# Patient Record
Sex: Female | Born: 1965
Health system: Southern US, Community
[De-identification: ages and names within clinical notes are randomized; demographics above are authoritative.]

## PROBLEM LIST (undated history)

## (undated) ENCOUNTER — Emergency Department

## (undated) DIAGNOSIS — K219 Gastro-esophageal reflux disease without esophagitis: Secondary | ICD-10-CM

## (undated) DIAGNOSIS — F4310 Post-traumatic stress disorder, unspecified: Secondary | ICD-10-CM

## (undated) DIAGNOSIS — F32A Depression, unspecified: Secondary | ICD-10-CM

## (undated) DIAGNOSIS — E1142 Type 2 diabetes mellitus with diabetic polyneuropathy: Secondary | ICD-10-CM

## (undated) DIAGNOSIS — E119 Type 2 diabetes mellitus without complications: Secondary | ICD-10-CM

## (undated) DIAGNOSIS — F419 Anxiety disorder, unspecified: Secondary | ICD-10-CM

## (undated) DIAGNOSIS — Z8052 Family history of malignant neoplasm of bladder: Secondary | ICD-10-CM

## (undated) DIAGNOSIS — I493 Ventricular premature depolarization: Secondary | ICD-10-CM

## (undated) DIAGNOSIS — G8929 Other chronic pain: Secondary | ICD-10-CM

## (undated) DIAGNOSIS — Z801 Family history of malignant neoplasm of trachea, bronchus and lung: Secondary | ICD-10-CM

## (undated) DIAGNOSIS — K8020 Calculus of gallbladder without cholecystitis without obstruction: Secondary | ICD-10-CM

## (undated) DIAGNOSIS — G43909 Migraine, unspecified, not intractable, without status migrainosus: Secondary | ICD-10-CM

## (undated) DIAGNOSIS — I10 Essential (primary) hypertension: Secondary | ICD-10-CM

## (undated) DIAGNOSIS — M545 Low back pain, unspecified: Secondary | ICD-10-CM

## (undated) DIAGNOSIS — F410 Panic disorder [episodic paroxysmal anxiety] without agoraphobia: Secondary | ICD-10-CM

## (undated) DIAGNOSIS — J189 Pneumonia, unspecified organism: Secondary | ICD-10-CM

## (undated) DIAGNOSIS — C50911 Malignant neoplasm of unspecified site of right female breast: Secondary | ICD-10-CM

## (undated) DIAGNOSIS — Z803 Family history of malignant neoplasm of breast: Secondary | ICD-10-CM

## (undated) DIAGNOSIS — K760 Fatty (change of) liver, not elsewhere classified: Secondary | ICD-10-CM

## (undated) DIAGNOSIS — M5136 Other intervertebral disc degeneration, lumbar region: Secondary | ICD-10-CM

## (undated) DIAGNOSIS — Z8042 Family history of malignant neoplasm of prostate: Secondary | ICD-10-CM

## (undated) DIAGNOSIS — E7800 Pure hypercholesterolemia, unspecified: Secondary | ICD-10-CM

## (undated) DIAGNOSIS — G709 Myoneural disorder, unspecified: Secondary | ICD-10-CM

## (undated) DIAGNOSIS — J439 Emphysema, unspecified: Secondary | ICD-10-CM

## (undated) DIAGNOSIS — F329 Major depressive disorder, single episode, unspecified: Secondary | ICD-10-CM

## (undated) DIAGNOSIS — R011 Cardiac murmur, unspecified: Secondary | ICD-10-CM

## (undated) DIAGNOSIS — R0600 Dyspnea, unspecified: Secondary | ICD-10-CM

## (undated) DIAGNOSIS — Z8041 Family history of malignant neoplasm of ovary: Secondary | ICD-10-CM

## (undated) DIAGNOSIS — Z808 Family history of malignant neoplasm of other organs or systems: Secondary | ICD-10-CM

## (undated) HISTORY — DX: Family history of malignant neoplasm of other organs or systems: Z80.8

## (undated) HISTORY — DX: Family history of malignant neoplasm of trachea, bronchus and lung: Z80.1

## (undated) HISTORY — DX: Family history of malignant neoplasm of prostate: Z80.42

## (undated) HISTORY — DX: Family history of malignant neoplasm of ovary: Z80.41

## (undated) HISTORY — DX: Type 2 diabetes mellitus without complications: E11.9

## (undated) HISTORY — DX: Family history of malignant neoplasm of bladder: Z80.52

## (undated) HISTORY — DX: Family history of malignant neoplasm of breast: Z80.3

---

## 1987-06-15 HISTORY — PX: WISDOM TOOTH EXTRACTION: SHX21

## 1996-06-14 HISTORY — PX: TUBAL LIGATION: SHX77

## 2009-05-11 ENCOUNTER — Ambulatory Visit: Payer: Self-pay | Admitting: Diagnostic Radiology

## 2009-05-11 ENCOUNTER — Emergency Department (HOSPITAL_BASED_OUTPATIENT_CLINIC_OR_DEPARTMENT_OTHER): Admission: EM | Admit: 2009-05-11 | Discharge: 2009-05-11 | Payer: Self-pay | Admitting: Emergency Medicine

## 2012-02-26 ENCOUNTER — Encounter (HOSPITAL_BASED_OUTPATIENT_CLINIC_OR_DEPARTMENT_OTHER): Payer: Self-pay | Admitting: *Deleted

## 2012-02-26 ENCOUNTER — Emergency Department (HOSPITAL_BASED_OUTPATIENT_CLINIC_OR_DEPARTMENT_OTHER)
Admission: EM | Admit: 2012-02-26 | Discharge: 2012-02-26 | Disposition: A | Discharge status: Home / Self Care | Payer: Self-pay | Attending: Emergency Medicine | Admitting: Emergency Medicine

## 2012-02-26 DIAGNOSIS — F17200 Nicotine dependence, unspecified, uncomplicated: Secondary | ICD-10-CM | POA: Insufficient documentation

## 2012-02-26 DIAGNOSIS — H9209 Otalgia, unspecified ear: Secondary | ICD-10-CM | POA: Insufficient documentation

## 2012-02-26 DIAGNOSIS — H6690 Otitis media, unspecified, unspecified ear: Secondary | ICD-10-CM

## 2012-02-26 DIAGNOSIS — I10 Essential (primary) hypertension: Secondary | ICD-10-CM | POA: Insufficient documentation

## 2012-02-26 HISTORY — DX: Migraine, unspecified, not intractable, without status migrainosus: G43.909

## 2012-02-26 HISTORY — DX: Essential (primary) hypertension: I10

## 2012-02-26 MED ORDER — HYDROCODONE-ACETAMINOPHEN 5-325 MG PO TABS: 2.0000 | ORAL_TABLET | ORAL | Status: AC | PRN

## 2012-02-26 MED ORDER — AMOXICILLIN 500 MG PO CAPS: 500.0000 mg | ORAL_CAPSULE | Freq: Three times a day (TID) | ORAL | Status: AC

## 2012-02-26 NOTE — ED Notes (Signed)
Pt reports a 1 week history of an occipital headache with pain radiating to right ear.  She also reports a cold 1 month ago.

## 2012-02-26 NOTE — ED Notes (Signed)
Family at bedside. 

## 2012-02-26 NOTE — ED Provider Notes (Signed)
History     CSN: [id number]  Arrival date & time 02/26/12  1638   First MD Initiated Contact with Patient 02/26/12 1732      Chief Complaint  Patient presents with  . Otalgia    (Consider location/radiation/quality/duration/timing/severity/associated sxs/prior treatment) Patient is a 46 y.o. female presenting with ear pain. The history is provided by the patient. No language interpreter was used.  Otalgia This is a new problem. The current episode started more than 1 week ago. There is pain in the right ear. The problem occurs constantly. The problem has been gradually worsening. There has been no fever. The pain is at a severity of 6/10. The pain is moderate. Associated symptoms include headaches and cough. Her past medical history does not include hearing loss.  Pt complains of right ear pain.  Pt reports congestion.  Pt reports she has also had a headache  Past Medical History  Diagnosis Date  . Hypertension   . Migraines     Past Surgical History  Procedure Date  . Cesarean section   . Tubal ligation     History reviewed. No pertinent family history.  History  Substance Use Topics  . Smoking status: Current Every Day Smoker  . Smokeless tobacco: Not on file  . Alcohol Use: Yes    OB History    Grav Para Term Preterm Abortions TAB SAB Ect Mult Living                  Review of Systems  HENT: Positive for ear pain.   Respiratory: Positive for cough.   Neurological: Positive for headaches.    Allergies  Sulfa antibiotics  Home Medications   Current Outpatient Rx  Name Route Sig Dispense Refill  . FLUOXETINE HCL 40 MG PO CAPS Oral Take 40 mg by mouth daily.    Marland Kitchen HYDROCHLOROTHIAZIDE 25 MG PO TABS Oral Take 25 mg by mouth daily.    . IBUPROFEN 200 MG PO TABS Oral Take 800 mg by mouth every 6 (six) hours as needed. For pain.    Marland Kitchen LISINOPRIL 40 MG PO TABS Oral Take 40 mg by mouth daily.    Marland Kitchen OMEPRAZOLE 10 MG PO CPDR Oral Take 10 mg by mouth daily.       BP 159/100  Pulse 96  Temp 98.1 F (36.7 C) (Oral)  Resp 20  Ht 5\' 4"  (1.626 m)  Wt 210 lb (95.255 kg)  BMI 36.05 kg/m2  SpO2 96%  Physical Exam  Nursing note and vitals reviewed. Constitutional: She is oriented to person, place, and time. She appears well-developed and well-nourished.  HENT:  Head: Normocephalic.  Left Ear: External ear normal.       Dull right tm.    Eyes: Conjunctivae normal are normal. Pupils are equal, round, and reactive to light.  Neck: Normal range of motion.  Cardiovascular: Normal rate and normal heart sounds.   Pulmonary/Chest: Effort normal and breath sounds normal.  Abdominal: Soft.  Musculoskeletal: Normal range of motion.  Neurological: She is alert and oriented to person, place, and time. She has normal reflexes.  Skin: Skin is warm.  Psychiatric: She has a normal mood and affect.    ED Course  Procedures (including critical care time)  Labs Reviewed - No data to display No results found.   No diagnosis found.    MDM  amoxicillain 500 and hydrocodone         Lonia Skinner [place], [place] 02/26/12 1831

## 2012-02-26 NOTE — ED Notes (Signed)
Pt c/o bilat ear pain (right is worse). Also has hx of migraines and has had a "sickening headache" for about a week.

## 2012-02-27 NOTE — ED Provider Notes (Signed)
Medical screening examination/treatment/procedure(s) were performed by non-physician practitioner and as supervising physician I was immediately available for consultation/collaboration.  Gauge Winski, MD 02/27/12 0046 

## 2012-10-04 ENCOUNTER — Encounter (HOSPITAL_COMMUNITY): Payer: Self-pay

## 2012-10-04 ENCOUNTER — Emergency Department (INDEPENDENT_AMBULATORY_CARE_PROVIDER_SITE_OTHER)
Admission: EM | Admit: 2012-10-04 | Discharge: 2012-10-04 | Disposition: A | Discharge status: Home / Self Care | Payer: Self-pay | Source: Home / Self Care | Attending: Family Medicine | Admitting: Family Medicine

## 2012-10-04 DIAGNOSIS — I10 Essential (primary) hypertension: Secondary | ICD-10-CM

## 2012-10-04 MED ORDER — HYDROCHLOROTHIAZIDE 25 MG PO TABS: 25.0000 mg | ORAL_TABLET | Freq: Every day | ORAL | Status: DC

## 2012-10-04 MED ORDER — LISINOPRIL 40 MG PO TABS: 40.0000 mg | ORAL_TABLET | Freq: Every day | ORAL | Status: DC

## 2012-10-04 NOTE — ED Provider Notes (Addendum)
History     CSN: [id number]  Arrival date & time 10/04/12  1153   First MD Initiated Contact with Patient 10/04/12 1326      Chief Complaint  Patient presents with  . Medication Refill    (Consider location/radiation/quality/duration/timing/severity/associated sxs/prior treatment) Patient is a 47 y.o. female presenting with hypertension. The history is provided by the patient and the spouse.  Hypertension This is a new problem. The current episode started more than 1 week ago (out of meds for bp for sev weeks, has been on rx since 1990.). Pertinent negatives include no chest pain, no headaches and no shortness of breath.    Past Medical History  Diagnosis Date  . Hypertension   . Migraines     Past Surgical History  Procedure Laterality Date  . Cesarean section    . Tubal ligation      History reviewed. No pertinent family history.  History  Substance Use Topics  . Smoking status: Current Every Day Smoker  . Smokeless tobacco: Not on file  . Alcohol Use: Yes    OB History   Grav Para Term Preterm Abortions TAB SAB Ect Mult Living                  Review of Systems  Constitutional: Negative.   Respiratory: Negative for shortness of breath.   Cardiovascular: Negative for chest pain, palpitations and leg swelling.  Neurological: Negative for headaches.    Allergies  Sulfa antibiotics  Home Medications   Current Outpatient Rx  Name  Route  Sig  Dispense  Refill  . hydrochlorothiazide (HYDRODIURIL) 25 MG tablet   Oral   Take 25 mg by mouth daily.         Marland Kitchen FLUoxetine (PROZAC) 40 MG capsule   Oral   Take 40 mg by mouth daily.         . hydrochlorothiazide (HYDRODIURIL) 25 MG tablet   Oral   Take 1 tablet (25 mg total) by mouth daily.   30 tablet   1   . ibuprofen (ADVIL,MOTRIN) 200 MG tablet   Oral   Take 800 mg by mouth every 6 (six) hours as needed. For pain.         Marland Kitchen lisinopril (PRINIVIL,ZESTRIL) 40 MG tablet   Oral   Take 40 mg by  mouth daily.         Marland Kitchen lisinopril (PRINIVIL,ZESTRIL) 40 MG tablet   Oral   Take 1 tablet (40 mg total) by mouth daily.   30 tablet   1   . omeprazole (PRILOSEC) 10 MG capsule   Oral   Take 10 mg by mouth daily.           BP 157/100  Pulse 83  Temp(Src) 98.8 F (37.1 C) (Oral)  Resp 16  SpO2 96%  Physical Exam  Nursing note and vitals reviewed. Constitutional: She is oriented to person, place, and time. She appears well-developed and well-nourished.  Eyes: Pupils are equal, round, and reactive to light.  Neck: Normal range of motion. Neck supple.  Cardiovascular: Normal rate, regular rhythm, normal heart sounds and intact distal pulses.   Pulmonary/Chest: Breath sounds normal.  Musculoskeletal: She exhibits no edema.  Lymphadenopathy:    She has no cervical adenopathy.  Neurological: She is alert and oriented to person, place, and time.  Skin: Skin is warm and dry.    ED Course  Procedures (including critical care time)  Labs Reviewed - No data to display No results  found.   1. Hypertension       MDM          Linna Hoff, MD 10/06/12 1725  Linna Hoff, MD 10/06/12 1725

## 2012-10-04 NOTE — ED Notes (Signed)
Out of her medication x 6 weeks (lisinopril) , LD of HCTZ was today, anxiety issues; was sent by [facility] for eval and poss referral [facility]; NAD at present

## 2012-10-25 ENCOUNTER — Ambulatory Visit: Payer: Self-pay

## 2012-10-30 ENCOUNTER — Ambulatory Visit: Payer: Self-pay

## 2012-11-07 ENCOUNTER — Ambulatory Visit: Payer: Self-pay | Attending: Family Medicine | Admitting: Internal Medicine

## 2012-11-07 VITALS — BP 125/83 | HR 76 | Temp 97.6°F | Resp 18 | Wt 211.0 lb

## 2012-11-07 DIAGNOSIS — I10 Essential (primary) hypertension: Secondary | ICD-10-CM

## 2012-11-07 DIAGNOSIS — G4700 Insomnia, unspecified: Secondary | ICD-10-CM | POA: Insufficient documentation

## 2012-11-07 MED ORDER — TRAZODONE HCL 50 MG PO TABS: 25.0000 mg | ORAL_TABLET | Freq: Every evening | ORAL | Status: DC | PRN

## 2012-11-07 MED ORDER — LISINOPRIL 40 MG PO TABS: 40.0000 mg | ORAL_TABLET | Freq: Every day | ORAL | Status: DC

## 2012-11-07 MED ORDER — HYDROCHLOROTHIAZIDE 25 MG PO TABS: 25.0000 mg | ORAL_TABLET | Freq: Every day | ORAL | Status: DC

## 2012-11-07 NOTE — Progress Notes (Signed)
Patient states has a history of HTN Needs medication refill

## 2012-11-07 NOTE — Progress Notes (Signed)
  Subjective:    Patient ID: Courtney Dixon, female    DOB: [birth year], 47 y.o.   MRN: [id number]  HPI 47 year old female who is here for followup of her hypertension. The patient states that she has been taking her lisinopril, HCTZ. She denies any chest pain occasional dependent edema. She was checked for borderline diabetes and hypertension a urine [place]. She feels that she occasionally has high blood sugar. [facility] CBG checks in the [facility] was elevated at 150. She denies any weight loss or weight gain Patient also states that she has a history of panic disorder and has been on Paxil, Prozac, Celexa in the past and nothing works for her. She wants something to help her sleep at night and relax her at night.    Review of Systems As in history of present illness  Family history Positive for hypertension, Sister has diabetes       Objective:   Physical Exam  Constitutional: She is oriented to person, place, and time. She appears well-developed and well-nourished. No distress.  HENT:  Head: Normocephalic and atraumatic.  Eyes: Conjunctivae and EOM are normal. Pupils are equal, round, and reactive to light.  Neck: Normal range of motion.  Cardiovascular: Normal rate and regular rhythm. Exam reveals no gallop and no friction rub.  No murmur heard.  Pulmonary/Chest: Effort normal and breath sounds normal. She has no wheezes. She has no rales. She exhibits no tenderness.  Abdominal: Soft. She exhibits no distension. There is no tenderness. There is no guarding.  Musculoskeletal: Normal range of motion.  Neurological: She is alert and oriented to person, place, and time. Coordination normal.   no gross focal neurologic deficits  Skin: Skin is warm and dry.  Psychiatric: She has a normal mood and affect. Her behavior is normal       Assessment & Plan:  Hypertension Refilled lisinopril/HCTZ.  Insomnia Start the patient on trazodone 25-50 mg each bedtime as needed

## 2012-11-08 LAB — CBC WITH DIFFERENTIAL/PLATELET
Basophils Relative: 0 % (ref 0–1)
Eosinophils Absolute: 0.2 10*3/uL (ref 0.0–0.7)
Eosinophils Relative: 2 % (ref 0–5)
HCT: 41.5 % (ref 36.0–46.0)
Hemoglobin: 13.8 g/dL (ref 12.0–15.0)
MCH: 29 pg (ref 26.0–34.0)
MCHC: 33.3 g/dL (ref 30.0–36.0)
Monocytes Absolute: 0.7 10*3/uL (ref 0.1–1.0)
Monocytes Relative: 6 % (ref 3–12)
Neutrophils Relative %: 42 % — ABNORMAL LOW (ref 43–77)

## 2012-11-08 LAB — BASIC METABOLIC PANEL
CO2: 27 mEq/L (ref 19–32)
Chloride: 102 mEq/L (ref 96–112)
Creat: 0.85 mg/dL (ref 0.50–1.10)

## 2012-11-08 LAB — HEMOGLOBIN A1C
Hgb A1c MFr Bld: 6.4 % — ABNORMAL HIGH (ref <5.7)
Mean Plasma Glucose: 137 mg/dL — ABNORMAL HIGH (ref <117)

## 2012-11-08 LAB — LIPID PANEL
LDL Cholesterol: 137 mg/dL — ABNORMAL HIGH (ref 0–99)
VLDL: 47 mg/dL — ABNORMAL HIGH (ref 0–40)

## 2012-11-08 LAB — TSH: TSH: 2.823 u[IU]/mL (ref 0.350–4.500)

## 2012-11-21 ENCOUNTER — Ambulatory Visit: Payer: Self-pay

## 2012-12-22 ENCOUNTER — Ambulatory Visit: Payer: Self-pay

## 2013-04-03 ENCOUNTER — Encounter (HOSPITAL_COMMUNITY): Payer: Self-pay | Admitting: Emergency Medicine

## 2013-04-03 ENCOUNTER — Emergency Department (HOSPITAL_COMMUNITY)
Admission: EM | Admit: 2013-04-03 | Discharge: 2013-04-03 | Disposition: A | Discharge status: Home / Self Care | Payer: Self-pay | Attending: Emergency Medicine | Admitting: Emergency Medicine

## 2013-04-03 DIAGNOSIS — R220 Localized swelling, mass and lump, head: Secondary | ICD-10-CM | POA: Insufficient documentation

## 2013-04-03 DIAGNOSIS — Z79899 Other long term (current) drug therapy: Secondary | ICD-10-CM | POA: Insufficient documentation

## 2013-04-03 DIAGNOSIS — I10 Essential (primary) hypertension: Secondary | ICD-10-CM | POA: Insufficient documentation

## 2013-04-03 DIAGNOSIS — K047 Periapical abscess without sinus: Secondary | ICD-10-CM | POA: Insufficient documentation

## 2013-04-03 DIAGNOSIS — K029 Dental caries, unspecified: Secondary | ICD-10-CM | POA: Insufficient documentation

## 2013-04-03 DIAGNOSIS — F17200 Nicotine dependence, unspecified, uncomplicated: Secondary | ICD-10-CM | POA: Insufficient documentation

## 2013-04-03 DIAGNOSIS — R51 Headache: Secondary | ICD-10-CM | POA: Insufficient documentation

## 2013-04-03 MED ORDER — PENICILLIN V POTASSIUM 500 MG PO TABS: 500.0000 mg | ORAL_TABLET | Freq: Four times a day (QID) | ORAL | Status: DC

## 2013-04-03 MED ORDER — HYDROCODONE-ACETAMINOPHEN 5-325 MG PO TABS
2.0000 | ORAL_TABLET | Freq: Once | ORAL | Status: AC
  Administered 2013-04-03: 2 via ORAL
  Filled 2013-04-03: qty 2

## 2013-04-03 MED ORDER — DOXYCYCLINE HYCLATE 100 MG PO CAPS: 100.0000 mg | ORAL_CAPSULE | Freq: Two times a day (BID) | ORAL | Status: DC

## 2013-04-03 MED ORDER — OXYCODONE-ACETAMINOPHEN 5-325 MG PO TABS: 1.0000 | ORAL_TABLET | ORAL | Status: DC | PRN

## 2013-04-03 MED ORDER — PENICILLIN G BENZATHINE 1200000 UNIT/2ML IM SUSP
1.2000 10*6.[IU] | Freq: Once | INTRAMUSCULAR | Status: AC
  Administered 2013-04-03: 1.2 10*6.[IU] via INTRAMUSCULAR
  Filled 2013-04-03: qty 2

## 2013-04-03 NOTE — ED Provider Notes (Signed)
CSN: [id number]     Arrival date & time 04/03/13  1540 History  This chart was scribed for non-physician practitioner Dierdre Forth, PA-C  working with Derwood Kaplan, MD by Clydene Laming, ED Scribe. This patient was seen in room TR07C/TR07C and the patient's care was started at 5:00 PM.   Chief Complaint  Patient presents with  . Abscess   (Consider location/radiation/quality/duration/timing/severity/associated sxs/prior Treatment) The history is provided by the patient. No language interpreter was used.   HPI Comments: Jalayne Ganesh is a 47 y.o. female who presents to the Emergency Department complaining of gradual, persistent, gradually worsening right-sided facial swelling with associated dental abscess  onset two days. Pt has a hx of broken teeth and is not normally seen by a dentist. She reports she has a history of dental abscesses and this feels the same. Pt denies fever, chills, nausea, and vomiting. She reports it hurts to eat and her appetite is not normal.  Nothing seems to make the pain better or worse. Patient tried over-the-counter ibuprofen without relief.  Past Medical History  Diagnosis Date  . Hypertension   . Migraines    Past Surgical History  Procedure Laterality Date  . Cesarean section    . Tubal ligation     History reviewed. No pertinent family history. History  Substance Use Topics  . Smoking status: Current Every Day Smoker    Types: Cigarettes  . Smokeless tobacco: Not on file  . Alcohol Use: Yes   OB History   Grav Para Term Preterm Abortions TAB SAB Ect Mult Living                 Review of Systems  Constitutional: Negative for fever, chills and appetite change.  HENT: Positive for dental problem and facial swelling. Negative for drooling, ear pain, nosebleeds, postnasal drip, rhinorrhea and trouble swallowing.   Eyes: Negative for pain and redness.  Respiratory: Negative for cough and wheezing.   Cardiovascular: Negative for chest pain.   Gastrointestinal: Negative for nausea, vomiting and abdominal pain.  Musculoskeletal: Negative for neck pain and neck stiffness.  Skin: Negative for color change and rash.  Neurological: Positive for headaches. Negative for weakness and light-headedness.  All other systems reviewed and are negative.    Allergies  Sulfa antibiotics  Home Medications   Current Outpatient Rx  Name  Route  Sig  Dispense  Refill  . hydrochlorothiazide (HYDRODIURIL) 25 MG tablet   Oral   Take 25 mg by mouth daily.         Marland Kitchen ibuprofen (ADVIL,MOTRIN) 200 MG tablet   Oral   Take 800 mg by mouth every 6 (six) hours as needed. For pain.         Marland Kitchen lisinopril (PRINIVIL,ZESTRIL) 40 MG tablet   Oral   Take 40 mg by mouth daily.         Marland Kitchen doxycycline (VIBRAMYCIN) 100 MG capsule   Oral   Take 1 capsule (100 mg total) by mouth 2 (two) times daily.   20 capsule   0   . oxyCODONE-acetaminophen (PERCOCET/ROXICET) 5-325 MG per tablet   Oral   Take 1 tablet by mouth every 4 (four) hours as needed for pain.   7 tablet   0   . penicillin v potassium (VEETID) 500 MG tablet   Oral   Take 1 tablet (500 mg total) by mouth 4 (four) times daily.   40 tablet   0    Triage Vitals:BP 115/73  Pulse 101  Temp(Src) 98.2 F (36.8 C) (Oral)  Resp 18  Ht 5\' 4"  (1.626 m)  Wt 198 lb 14.4 oz (90.22 kg)  BMI 34.12 kg/m2  SpO2 98% Physical Exam  Nursing note and vitals reviewed. Constitutional: She is oriented to person, place, and time. She appears well-developed and well-nourished.  HENT:  Head: Normocephalic.  Right Ear: Tympanic membrane, external ear and ear canal normal.  Left Ear: Tympanic membrane, external ear and ear canal normal.  Nose: Nose normal. Right sinus exhibits no maxillary sinus tenderness and no frontal sinus tenderness. Left sinus exhibits no maxillary sinus tenderness and no frontal sinus tenderness.  Mouth/Throat: Uvula is midline, oropharynx is clear and moist and mucous membranes  are normal. No oral lesions. Abnormal dentition. Dental abscesses and dental caries present. No uvula swelling or lacerations. No oropharyngeal exudate, posterior oropharyngeal edema, posterior oropharyngeal erythema or tonsillar abscesses.    Small amount of visible swelling of the right lower jaw tonsillar and submandibular lymphadenopathy only on the right    Eyes: Conjunctivae are normal. Pupils are equal, round, and reactive to light. Right eye exhibits no discharge. Left eye exhibits no discharge.  Neck: Normal range of motion. Neck supple.  Cardiovascular: Normal rate, regular rhythm, normal heart sounds and intact distal pulses.   No murmur heard. Pulmonary/Chest: Effort normal and breath sounds normal. No respiratory distress. She has no wheezes.  Abdominal: Soft. Bowel sounds are normal. She exhibits no distension. There is no tenderness.  Lymphadenopathy:       Head (right side): Submandibular and tonsillar adenopathy present. No submental, no preauricular, no posterior auricular and no occipital adenopathy present.       Head (left side): No submental, no submandibular, no tonsillar, no preauricular, no posterior auricular and no occipital adenopathy present.    She has no cervical adenopathy.       Right cervical: No superficial cervical, no deep cervical and no posterior cervical adenopathy present.      Left cervical: No superficial cervical, no deep cervical and no posterior cervical adenopathy present.  Neurological: She is alert and oriented to person, place, and time. No cranial nerve deficit. She exhibits normal muscle tone. Coordination normal.  No cranial nerve deficit  Skin: Skin is warm and dry.  Psychiatric: She has a normal mood and affect.    ED Course  INCISION AND DRAINAGE Date/Time: 04/03/2013 6:10 PM Performed by: Dierdre Forth Authorized by: Dierdre Forth Consent: Verbal consent obtained. Risks and benefits: risks, benefits and alternatives  were discussed Consent given by: patient Patient understanding: patient states understanding of the procedure being performed Patient consent: the patient's understanding of the procedure matches consent given Procedure consent: procedure consent matches procedure scheduled Relevant documents: relevant documents present and verified Site marked: the operative site was marked Required items: required blood products, implants, devices, and special equipment available Patient identity confirmed: verbally with patient and arm band Time out: Immediately prior to procedure a "time out" was called to verify the correct patient, procedure, equipment, support staff and site/side marked as required. Type: abscess Body area: mouth (dental apices) Patient sedated: no Needle gauge: 18 Complexity: simple Drainage: purulent Drainage amount: moderate Wound treatment: wound left open Patient tolerance: Patient tolerated the procedure well with no immediate complications.   (including critical care time) DIAGNOSTIC STUDIES: Oxygen Saturation is 98% on RA, normal by my interpretation.    COORDINATION OF CARE: 5:08 PM- Discussed treatment plan with pt at bedside. Pt verbalized understanding and agreement with plan.  Labs Review Labs Reviewed - No data to display Imaging Review No results found.  EKG Interpretation   None       MDM   1. Dental abscess      Zniyah Midkiff presents with dental pain and palpable abscess to the root of tooth #29. Patient without fevers or chills.    Patient with toothache and gross abscess.  I & D of abscess with moderate purulent drainage and decreased and patient pain.. Exam unconcerning for Ludwig's angina or spread of infection.  Discussed my concern with patient who quickly states that she will be unable to afford expensive antibiotics. Will treat initially with penicillin and pain medicine.  I have given the patient a prescription for doxycycline. I  recommended that if she is no better in 24 hours to fill the doxycycline and begin night. Urged patient to follow-up with dentist.   Patient given followup resources for dental clinics in the area.  It has been determined that no acute conditions requiring further emergency intervention are present at this time. The patient/guardian have been advised of the diagnosis and plan. We have discussed signs and symptoms that warrant return to the ED, such as changes or worsening in symptoms.   Vital signs are stable at discharge.   BP 96/63  Pulse 96  Temp(Src) 98.2 F (36.8 C) (Oral)  Resp 16  Ht 5\' 4"  (1.626 m)  Wt 198 lb 14.4 oz (90.22 kg)  BMI 34.12 kg/m2  SpO2 96%  Patient/guardian has voiced understanding and agreed to follow-up with the PCP or specialist.    I personally performed the services described in this documentation, which was scribed in my presence. The recorded information has been reviewed and is accurate.     Dierdre Forth, PA-C 04/03/13 1944

## 2013-04-03 NOTE — ED Notes (Signed)
Pt reports swelling to the right side of her face that started last night. Reports that she has some broken teeth on that same side. Denies any other complaints at this time.

## 2013-04-06 NOTE — ED Provider Notes (Signed)
Medical screening examination/treatment/procedure(s) were performed by non-physician practitioner and as supervising physician I was immediately available for consultation/collaboration.   Brannon Levene, MD 04/06/13 0808 

## 2013-10-15 ENCOUNTER — Ambulatory Visit: Payer: Self-pay | Attending: Internal Medicine | Admitting: Internal Medicine

## 2013-10-15 ENCOUNTER — Encounter: Payer: Self-pay | Admitting: Internal Medicine

## 2013-10-15 VITALS — BP 153/97 | HR 81 | Temp 98.2°F | Resp 18 | Ht 64.0 in | Wt 210.0 lb

## 2013-10-15 DIAGNOSIS — I10 Essential (primary) hypertension: Secondary | ICD-10-CM

## 2013-10-15 DIAGNOSIS — F329 Major depressive disorder, single episode, unspecified: Secondary | ICD-10-CM | POA: Insufficient documentation

## 2013-10-15 DIAGNOSIS — F32A Depression, unspecified: Secondary | ICD-10-CM | POA: Insufficient documentation

## 2013-10-15 DIAGNOSIS — Z833 Family history of diabetes mellitus: Secondary | ICD-10-CM

## 2013-10-15 DIAGNOSIS — Z79899 Other long term (current) drug therapy: Secondary | ICD-10-CM | POA: Insufficient documentation

## 2013-10-15 DIAGNOSIS — F419 Anxiety disorder, unspecified: Secondary | ICD-10-CM

## 2013-10-15 DIAGNOSIS — F411 Generalized anxiety disorder: Secondary | ICD-10-CM

## 2013-10-15 DIAGNOSIS — Z0000 Encounter for general adult medical examination without abnormal findings: Secondary | ICD-10-CM

## 2013-10-15 DIAGNOSIS — F17200 Nicotine dependence, unspecified, uncomplicated: Secondary | ICD-10-CM

## 2013-10-15 LAB — CBC
HCT: 40.4 % (ref 36.0–46.0)
HEMOGLOBIN: 14.1 g/dL (ref 12.0–15.0)
MCH: 29.4 pg (ref 26.0–34.0)
MCHC: 34.9 g/dL (ref 30.0–36.0)
MCV: 84.3 fL (ref 78.0–100.0)
Platelets: 311 10*3/uL (ref 150–400)
RBC: 4.79 MIL/uL (ref 3.87–5.11)
RDW: 13.8 % (ref 11.5–15.5)
WBC: 9.8 10*3/uL (ref 4.0–10.5)

## 2013-10-15 LAB — GLUCOSE, POCT (MANUAL RESULT ENTRY): POC GLUCOSE: 117 mg/dL — AB (ref 70–99)

## 2013-10-15 LAB — POCT GLYCOSYLATED HEMOGLOBIN (HGB A1C): HEMOGLOBIN A1C: 5.9

## 2013-10-15 MED ORDER — LISINOPRIL 40 MG PO TABS: 40.0000 mg | ORAL_TABLET | Freq: Every day | ORAL | Status: DC

## 2013-10-15 MED ORDER — ALPRAZOLAM 0.25 MG PO TABS: 0.2500 mg | ORAL_TABLET | Freq: Two times a day (BID) | ORAL | Status: DC | PRN

## 2013-10-15 MED ORDER — ESCITALOPRAM OXALATE 10 MG PO TABS: 10.0000 mg | ORAL_TABLET | Freq: Every day | ORAL | Status: DC

## 2013-10-15 MED ORDER — HYDROCHLOROTHIAZIDE 25 MG PO TABS: 25.0000 mg | ORAL_TABLET | Freq: Every day | ORAL | Status: DC

## 2013-10-15 NOTE — Progress Notes (Signed)
Here to F/U for HTN States having sugar cravings for 6-8 months C/O anxiety affecting new job. PHQ-9 score of 21. Provider aware. C/O feeling "hot all the time."

## 2013-10-15 NOTE — Progress Notes (Signed)
Patient ID: Courtney Dixon, female   DOB: 12-May-1966, 48 y.o.   MRN: [id number] HYPERTENSION Home BP readings: does not take at home Chest Pain: none Lightheadedness or Syncope: none Leg Swelling: none   Medications When took last medication:  Yesterday morning Misses taking medications:  Yes, to avoid running out of medication  Diet Ability to limit unhealthy foods:  Not good  Exercise Frequency: none  Patient reports that she has been off her lisinopril for over a year due to financial concerns. Patient reports that she felt she did not need both antihypertensive medications and that she would be controlled with HCTZ.  She also reports a long history of anxiety which she has been on multiple medications in the past. Patient reports anxiety is so bad it is affecting her daily functions. She reports anxiety over the past 10 years but has not had money to seek counseling.  Monitoring Labs and Parameters Last A1C:  Lab Results  Component Value Date   HGBA1C 5.9 10/15/2013    Last Lipid:     Component Value Date/Time   CHOL 221* 11/07/2012 1222   HDL 37* 11/07/2012 1222    Last Bmet  Potassium  Date Value Ref Range Status  11/07/2012 4.2  3.5 - 5.3 mEq/L Final     Sodium  Date Value Ref Range Status  11/07/2012 137  135 - 145 mEq/L Final     Creat  Date Value Ref Range Status  11/07/2012 0.85  0.50 - 1.10 mg/dL Final      Last BPs:  BP Readings from Last 3 Encounters:  10/15/13 153/97  04/03/13 96/63  11/07/12 125/83    Weight history:  Wt Readings from Last 3 Encounters:  10/15/13 210 lb (95.255 kg)  04/03/13 198 lb 14.4 oz (90.22 kg)  11/07/12 211 lb (95.709 kg)   Review of Systems  Constitutional: Negative for fever and chills.  Eyes: Negative for blurred vision and double vision.  Respiratory: Negative for cough and shortness of breath.   Cardiovascular: Negative for chest pain, palpitations, claudication and leg swelling.  Gastrointestinal: Negative for  abdominal pain.  Genitourinary: Negative.   Musculoskeletal: Negative.   Skin: Negative.   Neurological: Positive for tingling (occasionally down arms) and headaches. Negative for dizziness, sensory change and seizures.  Endo/Heme/Allergies: Negative.   Psychiatric/Behavioral: Negative for depression and substance abuse. The patient is nervous/anxious.        Physical Exam  Constitutional: She is oriented to person, place, and time and well-developed, well-nourished, and in no distress.  HENT:  Head: Normocephalic.  Right Ear: External ear normal.  Left Ear: External ear normal.  Mouth/Throat: Oropharynx is clear and moist.  Eyes: Conjunctivae and EOM are normal. Pupils are equal, round, and reactive to light.  Neck: Normal range of motion. Neck supple. No tracheal deviation present. No thyromegaly present.  Cardiovascular: Normal rate, regular rhythm and normal heart sounds.   Pulmonary/Chest: Effort normal and breath sounds normal. Right breast exhibits no mass, no nipple discharge, no skin change and no tenderness. Left breast exhibits no mass, no nipple discharge, no skin change and no tenderness.  Abdominal: Soft. Bowel sounds are normal. She exhibits no distension. There is no tenderness.  Musculoskeletal: Normal range of motion.  Lymphadenopathy:    She has no cervical adenopathy.  Neurological: She is alert and oriented to person, place, and time.  Skin: Skin is warm and dry.  Psychiatric: Mood and judgment normal.    Courtney Dixon was seen today for hypertension,  medication refill and anxiety.  Diagnoses and associated orders for this visit:  HTN (hypertension) - hydrochlorothiazide (HYDRODIURIL) 25 MG tablet; Take 1 tablet (25 mg total) by mouth daily. - lisinopril (PRINIVIL,ZESTRIL) 40 MG tablet; Take 1 tablet (40 mg total) by mouth daily. RTC in one week for a BP check with nurse  Generalized anxiety disorder - escitalopram (LEXAPRO) 10 MG tablet; Take 1 tablet (10 mg  total) by mouth daily.  Will only give one month, will need to follow up with psychiatry for maintenance. - ALPRAZolam (XANAX) 0.25 MG tablet; Take 1 tablet (0.25 mg total) by mouth 2 (two) times daily as needed for anxiety. Will not refill here. Need psychiatry. - Ambulatory referral to Psychiatry  Preventative health care - Lipid panel - COMPLETE METABOLIC PANEL WITH GFR - CBC - MM DIGITAL SCREENING BILATERAL; Future  Smoking Smoking cessation discussed as well as associated complications  Family history of diabetes mellitus - Glucose (CBG) - HgB A1c  Return in about 3 months (around 01/15/2014) for Hypertension. 1 week -Nurse Visit, BP check.  Chari Manning, NP 10/15/2013, 1:18 PM

## 2013-10-15 NOTE — Patient Instructions (Addendum)
DASH Diet The DASH diet stands for "Dietary Approaches to Stop Hypertension." It is a healthy eating plan that has been shown to reduce high blood pressure (hypertension) in as little as 14 days, while also possibly providing other significant health benefits. These other health benefits include reducing the risk of breast cancer after menopause and reducing the risk of type 2 diabetes, heart disease, colon cancer, and stroke. Health benefits also include weight loss and slowing kidney failure in patients with chronic kidney disease.  DIET GUIDELINES  Limit salt (sodium). Your diet should contain less than 1500 mg of sodium daily.  Limit refined or processed carbohydrates. Your diet should include mostly whole grains. Desserts and added sugars should be used sparingly.  Include small amounts of heart-healthy fats. These types of fats include nuts, oils, and tub margarine. Limit saturated and trans fats. These fats have been shown to be harmful in the body. CHOOSING FOODS  The following food groups are based on a 2000 calorie diet. See your Registered Dietitian for individual calorie needs. Grains and Grain Products (6 to 8 servings daily)  Eat More Often: Whole-wheat bread, brown rice, whole-grain or wheat pasta, quinoa, popcorn without added fat or salt (air popped).  Eat Less Often: White bread, white pasta, white rice, cornbread. Vegetables (4 to 5 servings daily)  Eat More Often: Fresh, frozen, and canned vegetables. Vegetables may be raw, steamed, roasted, or grilled with a minimal amount of fat.  Eat Less Often/Avoid: Creamed or fried vegetables. Vegetables in a cheese sauce. Fruit (4 to 5 servings daily)  Eat More Often: All fresh, canned (in natural juice), or frozen fruits. Dried fruits without added sugar. One hundred percent fruit juice ( cup [237 mL] daily).  Eat Less Often: Dried fruits with added sugar. Canned fruit in light or heavy syrup. [employer], Fish, and Poultry (2  servings or less daily. One serving is 3 to 4 oz [85-114 g]).  Eat More Often: Ninety percent or leaner ground beef, tenderloin, sirloin. Round cuts of beef, chicken breast, [place] breast. All fish. Grill, bake, or broil your meat. Nothing should be fried.  Eat Less Often/Avoid: Fatty cuts of meat, [place], or chicken leg, thigh, or wing. Fried cuts of meat or fish. Dairy (2 to 3 servings)  Eat More Often: Low-fat or fat-free milk, low-fat plain or light yogurt, reduced-fat or part-skim cheese.  Eat Less Often/Avoid: Milk (whole, 2%).Whole milk yogurt. Full-fat cheeses. Nuts, Seeds, and Legumes (4 to 5 servings per week)  Eat More Often: All without added salt.  Eat Less Often/Avoid: Salted nuts and seeds, canned beans with added salt. Fats and Sweets (limited)  Eat More Often: Vegetable oils, tub margarines without trans fats, sugar-free gelatin. Mayonnaise and salad dressings.  Eat Less Often/Avoid: Coconut oils, palm oils, butter, stick margarine, cream, half and half, cookies, candy, pie. FOR MORE INFORMATION The Dash Diet Eating Plan: www.dashdiet.org Document Released: 05/20/2011 Document Revised: 08/23/2011 Document Reviewed: 05/20/2011 [facility] Patient Information 2014 [place], [place]. Generalized Anxiety Disorder Generalized anxiety disorder (GAD) is a mental disorder. It interferes with life functions, including relationships, work, and school. GAD is different from normal anxiety, which everyone experiences at some point in their lives in response to specific life events and activities. Normal anxiety actually helps [place] prepare for and get through these life events and activities. Normal anxiety goes away after the event or activity is over.  GAD causes anxiety that is not necessarily related to specific events or activities. It also causes excess  anxiety in proportion to specific events or activities. The anxiety associated with GAD is also difficult to control. GAD can vary  from mild to severe. People with severe GAD can have intense waves of anxiety with physical symptoms (panic attacks).  SYMPTOMS The anxiety and worry associated with GAD are difficult to control. This anxiety and worry are related to many life events and activities and also occur more days than not for 6 months or longer. People with GAD also have three or more of the following symptoms (one or more in children): Restlessness.  Fatigue. Difficulty concentrating.  Irritability. Muscle tension. Difficulty sleeping or unsatisfying sleep. DIAGNOSIS GAD is diagnosed through an assessment by your caregiver. Your caregiver will ask you questions aboutyour mood,physical symptoms, and events in your life. Your caregiver may ask you about your medical history and use of alcohol or drugs, including prescription medications. Your caregiver may also do a physical exam and blood tests. Certain medical conditions and the use of certain substances can cause symptoms similar to those associated with GAD. Your caregiver may refer you to a mental health specialist for further evaluation. TREATMENT The following therapies are usually used to treat GAD:  Medication Antidepressant medication usually is prescribed for long-term daily control. Antianxiety medications may be added in severe cases, especially when panic attacks occur.  Talk therapy (psychotherapy) Certain types of talk therapy can be helpful in treating GAD by providing support, education, and guidance. A form of talk therapy called cognitive behavioral therapy can teach you healthy ways to think about and react to daily life events and activities. Stress managementtechniques These include yoga, meditation, and exercise and can be very helpful when they are practiced regularly. A mental health specialist can help determine which treatment is best for you. Some people see improvement with one therapy. However, other people require a combination of  therapies. Document Released: 09/25/2012 Document Reviewed: 09/25/2012 [facility] Patient Information 2014 [place], [place].

## 2013-10-16 ENCOUNTER — Other Ambulatory Visit: Payer: Self-pay | Admitting: Internal Medicine

## 2013-10-16 DIAGNOSIS — E785 Hyperlipidemia, unspecified: Secondary | ICD-10-CM

## 2013-10-16 LAB — COMPLETE METABOLIC PANEL WITH GFR
ALT: 18 U/L (ref 0–35)
AST: 19 U/L (ref 0–37)
Albumin: 4.1 g/dL (ref 3.5–5.2)
Alkaline Phosphatase: 71 U/L (ref 39–117)
BILIRUBIN TOTAL: 0.5 mg/dL (ref 0.2–1.2)
BUN: 12 mg/dL (ref 6–23)
CO2: 29 meq/L (ref 19–32)
CREATININE: 0.72 mg/dL (ref 0.50–1.10)
Calcium: 9.4 mg/dL (ref 8.4–10.5)
Chloride: 102 mEq/L (ref 96–112)
GLUCOSE: 88 mg/dL (ref 70–99)
Potassium: 3.9 mEq/L (ref 3.5–5.3)
SODIUM: 142 meq/L (ref 135–145)
TOTAL PROTEIN: 6.9 g/dL (ref 6.0–8.3)

## 2013-10-16 LAB — LIPID PANEL
CHOL/HDL RATIO: 6 ratio
CHOLESTEROL: 244 mg/dL — AB (ref 0–200)
HDL: 41 mg/dL (ref >39)
LDL Cholesterol: 172 mg/dL — ABNORMAL HIGH (ref 0–99)
TRIGLYCERIDES: 154 mg/dL — AB (ref <150)
VLDL: 31 mg/dL (ref 0–40)

## 2013-10-16 MED ORDER — SIMVASTATIN 20 MG PO TABS: 20.0000 mg | ORAL_TABLET | Freq: Every day | ORAL | Status: DC

## 2013-10-17 ENCOUNTER — Telehealth: Payer: Self-pay | Admitting: *Deleted

## 2013-10-17 NOTE — Telephone Encounter (Signed)
Message copied by Drevin Ortner R on Wed Oct 17, 2013  2:09 PM ------      Message from: KECK, VALERIE A      Created: Tue Oct 16, 2013  4:34 PM       Let pt know her cholesterol was high and I have started her on simvastatin to take daily. She can pick it up at our pharmacy. Teach patient about lifestyle modifications and diet to help lower cholesterol. Thanks ------ 

## 2013-10-17 NOTE — Telephone Encounter (Signed)
Message copied by Joan Mayans on Wed Oct 17, 2013  2:09 PM ------      Message from: Chari Manning A      Created: Tue Oct 16, 2013  4:34 PM       Let pt know her cholesterol was high and I have started her on simvastatin to take daily. She can pick it up at our pharmacy. Teach patient about lifestyle modifications and diet to help lower cholesterol. Thanks ------

## 2013-10-17 NOTE — Telephone Encounter (Signed)
Left message

## 2013-10-17 NOTE — Telephone Encounter (Signed)
Message copied by Joan Mayans on Wed Oct 17, 2013 12:50 PM ------      Message from: Chari Manning A      Created: Tue Oct 16, 2013  4:34 PM       Let pt know her cholesterol was high and I have started her on simvastatin to take daily. She can pick it up at our pharmacy. Teach patient about lifestyle modifications and diet to help lower cholesterol. Thanks ------

## 2013-10-17 NOTE — Telephone Encounter (Signed)
Pt is aware of her lab results and her rx is at our pharmacy.

## 2013-12-07 ENCOUNTER — Telehealth: Payer: Self-pay | Admitting: Internal Medicine

## 2013-12-07 NOTE — Telephone Encounter (Signed)
Pt requesting refill of Lexapro. Please f/u

## 2013-12-07 NOTE — Telephone Encounter (Signed)
Pt requesting a medication evaluation for escitalopram (LEXAPRO) 10 MG tablet, requesting dosage to be increased. Please f/u with pt.

## 2013-12-09 NOTE — Telephone Encounter (Signed)
May send patient Lexapro 20 mg to take daily. Send patient 30 tablets with no refills. Patient needs to see psychiatry per last visit note. Give her information for [facility] or [facility]. Thanks.

## 2013-12-10 ENCOUNTER — Other Ambulatory Visit: Payer: Self-pay | Admitting: Emergency Medicine

## 2013-12-10 ENCOUNTER — Telehealth: Payer: Self-pay | Admitting: Emergency Medicine

## 2013-12-10 MED ORDER — ESCITALOPRAM OXALATE 20 MG PO TABS: 20.0000 mg | ORAL_TABLET | Freq: Every day | ORAL | Status: DC

## 2013-12-10 NOTE — Telephone Encounter (Signed)
Left message for pt to call to pick script Lexapro up at front desk

## 2014-01-18 ENCOUNTER — Ambulatory Visit: Payer: Self-pay | Attending: Internal Medicine | Admitting: Internal Medicine

## 2014-01-18 ENCOUNTER — Encounter: Payer: Self-pay | Admitting: Internal Medicine

## 2014-01-18 VITALS — BP 121/82 | HR 99 | Temp 98.6°F | Resp 16 | Ht 64.0 in | Wt 213.0 lb

## 2014-01-18 DIAGNOSIS — Z881 Allergy status to other antibiotic agents status: Secondary | ICD-10-CM | POA: Insufficient documentation

## 2014-01-18 DIAGNOSIS — F411 Generalized anxiety disorder: Secondary | ICD-10-CM

## 2014-01-18 DIAGNOSIS — F17200 Nicotine dependence, unspecified, uncomplicated: Secondary | ICD-10-CM

## 2014-01-18 DIAGNOSIS — I10 Essential (primary) hypertension: Secondary | ICD-10-CM

## 2014-01-18 MED ORDER — HYDROXYZINE PAMOATE 25 MG PO CAPS: 25.0000 mg | ORAL_CAPSULE | Freq: Every evening | ORAL | Status: DC | PRN

## 2014-01-18 NOTE — Patient Instructions (Signed)
Smoking Cessation Quitting smoking is important to your health and has many advantages. However, it is not always easy to quit since nicotine is a very addictive drug. Oftentimes, people try 3 times or more before being able to quit. This document explains the best ways for you to prepare to quit smoking. Quitting takes hard work and a lot of effort, but you can do it. ADVANTAGES OF QUITTING SMOKING  You will live longer, feel better, and live better.  Your body will feel the impact of quitting smoking almost immediately.  Within 20 minutes, blood pressure decreases. Your pulse returns to its normal level.  After 8 hours, carbon monoxide levels in the blood return to normal. Your oxygen level increases.  After 24 hours, the chance of having a heart attack starts to decrease. Your breath, hair, and body stop smelling like smoke.  After 48 hours, damaged nerve endings begin to recover. Your sense of taste and smell improve.  After 72 hours, the body is virtually free of nicotine. Your bronchial tubes relax and breathing becomes easier.  After 2 to 12 weeks, lungs can hold more air. Exercise becomes easier and circulation improves.  The risk of having a heart attack, stroke, cancer, or lung disease is greatly reduced.  After 1 year, the risk of coronary heart disease is cut in half.  After 5 years, the risk of stroke falls to the same as a nonsmoker.  After 10 years, the risk of lung cancer is cut in half and the risk of other cancers decreases significantly.  After 15 years, the risk of coronary heart disease drops, usually to the level of a nonsmoker.  If you are pregnant, quitting smoking will improve your chances of having a healthy baby.  The people you live with, especially any children, will be healthier.  You will have extra money to spend on things other than cigarettes. QUESTIONS TO THINK ABOUT BEFORE ATTEMPTING TO QUIT You may want to talk about your answers with your  health care provider.  Why do you want to quit?  If you tried to quit in the past, what helped and what did not?  What will be the most difficult situations for you after you quit? How will you plan to handle them?  Who can help you through the tough times? Your family? Friends? A health care provider?  What pleasures do you get from smoking? What ways can you still get pleasure if you quit? Here are some questions to ask your health care provider:  How can you help me to be successful at quitting?  What medicine do you think would be best for me and how should I take it?  What should I do if I need more help?  What is smoking withdrawal like? How can I get information on withdrawal? GET READY  Set a quit date.  Change your environment by getting rid of all cigarettes, ashtrays, matches, and lighters in your home, car, or work. Do not let people smoke in your home.  Review your past attempts to quit. Think about what worked and what did not. GET SUPPORT AND ENCOURAGEMENT You have a better chance of being successful if you have help. You can get support in many ways.  Tell your family, friends, and coworkers that you are going to quit and need their support. Ask them not to smoke around you.  Get individual, group, or telephone counseling and support. Programs are available at local hospitals and health centers. Call   your local health department for information about programs in your area.  Spiritual beliefs and practices may help some smokers quit.  Download a "quit meter" on your computer to keep track of quit statistics, such as how long you have gone without smoking, cigarettes not smoked, and money saved.  Get a self-help book about quitting smoking and staying off tobacco. LEARN NEW SKILLS AND BEHAVIORS  Distract yourself from urges to smoke. Talk to someone, go for a walk, or occupy your time with a task.  Change your normal routine. Take a different route to work.  Drink tea instead of coffee. Eat breakfast in a different place.  Reduce your stress. Take a hot bath, exercise, or read a book.  Plan something enjoyable to do every day. Reward yourself for not smoking.  Explore interactive web-based programs that specialize in helping you quit. GET MEDICINE AND USE IT CORRECTLY Medicines can help you stop smoking and decrease the urge to smoke. Combining medicine with the above behavioral methods and support can greatly increase your chances of successfully quitting smoking.  Nicotine replacement therapy helps deliver nicotine to your body without the negative effects and risks of smoking. Nicotine replacement therapy includes nicotine gum, lozenges, inhalers, nasal sprays, and skin patches. Some may be available over-the-counter and others require a prescription.  Antidepressant medicine helps people abstain from smoking, but how this works is unknown. This medicine is available by prescription.  Nicotinic receptor partial agonist medicine simulates the effect of nicotine in your brain. This medicine is available by prescription. Ask your health care provider for advice about which medicines to use and how to use them based on your health history. Your health care provider will tell you what side effects to look out for if you choose to be on a medicine or therapy. Carefully read the information on the package. Do not use any other product containing nicotine while using a nicotine replacement product.  RELAPSE OR DIFFICULT SITUATIONS Most relapses occur within the first 3 months after quitting. Do not be discouraged if you start smoking again. Remember, most people try several times before finally quitting. You may have symptoms of withdrawal because your body is used to nicotine. You may crave cigarettes, be irritable, feel very hungry, cough often, get headaches, or have difficulty concentrating. The withdrawal symptoms are only temporary. They are strongest  when you first quit, but they will go away within 10-14 days. To reduce the chances of relapse, try to:  Avoid drinking alcohol. Drinking lowers your chances of successfully quitting.  Reduce the amount of caffeine you consume. Once you quit smoking, the amount of caffeine in your body increases and can give you symptoms, such as a rapid heartbeat, sweating, and anxiety.  Avoid smokers because they can make you want to smoke.  Do not let weight gain distract you. Many smokers will gain weight when they quit, usually less than 10 pounds. Eat a healthy diet and stay active. You can always lose the weight gained after you quit.  Find ways to improve your mood other than smoking. FOR MORE INFORMATION  www.smokefree.gov  Document Released: 05/25/2001 Document Revised: 10/15/2013 Document Reviewed: 09/09/2011 [employer] Patient Information 2015 [employer]. This information is not intended to replace advice given to you by your health care provider. Make sure you discuss any questions you have with your health care provider.  

## 2014-01-18 NOTE — Progress Notes (Signed)
Pt is here following up on her HTN. Pt has questions about her lexapro.

## 2014-01-18 NOTE — Progress Notes (Signed)
Patient ID: Courtney Dixon, female   DOB: 09-25-1965, 48 y.o.   MRN: [id number]  CC: F/u  HPI: Patient reports that she has been taking her BP medication daily and states that she feels much better.  Patient reports that she has been compliant with simvastatin and has been attempting to change her diet.  She states she does not feel any changes since beginning Lexapro.  She c/o of frequent hot flashes and mood swings. She has been postmenopausal since age 22.    Allergies  Allergen Reactions  . Sulfa Antibiotics Itching   Past Medical History  Diagnosis Date  . Hypertension   . Migraines    Current Outpatient Prescriptions on File Prior to Visit  Medication Sig Dispense Refill  . escitalopram (LEXAPRO) 20 MG tablet Take 1 tablet (20 mg total) by mouth daily.  30 tablet  0  . hydrochlorothiazide (HYDRODIURIL) 25 MG tablet Take 1 tablet (25 mg total) by mouth daily.  30 tablet  3  . ibuprofen (ADVIL,MOTRIN) 200 MG tablet Take 800 mg by mouth every 6 (six) hours as needed. For pain.      Marland Kitchen lisinopril (PRINIVIL,ZESTRIL) 40 MG tablet Take 1 tablet (40 mg total) by mouth daily.  30 tablet  3  . simvastatin (ZOCOR) 20 MG tablet Take 1 tablet (20 mg total) by mouth at bedtime.  30 tablet  3  . ALPRAZolam (XANAX) 0.25 MG tablet Take 1 tablet (0.25 mg total) by mouth 2 (two) times daily as needed for anxiety.  10 tablet  0  . doxycycline (VIBRAMYCIN) 100 MG capsule Take 1 capsule (100 mg total) by mouth 2 (two) times daily.  20 capsule  0  . oxyCODONE-acetaminophen (PERCOCET/ROXICET) 5-325 MG per tablet Take 1 tablet by mouth every 4 (four) hours as needed for pain.  7 tablet  0  . penicillin v potassium (VEETID) 500 MG tablet Take 1 tablet (500 mg total) by mouth 4 (four) times daily.  40 tablet  0   No current facility-administered medications on file prior to visit.   Family History  Problem Relation Age of Onset  . Cancer Mother   . Drug abuse Mother   . Heart disease Mother   . Hypertension  Mother   . Diabetes Mother   . Cancer Father   . Diabetes Sister   . Diabetes Sister    History   Social History  . Marital Status: Married    Spouse Name: N/A    Number of Children: N/A  . Years of Education: N/A   Occupational History  . Not on file.   Social History Main Topics  . Smoking status: Current Every Day Smoker    Types: Cigarettes  . Smokeless tobacco: Not on file  . Alcohol Use: Yes  . Drug Use: No  . Sexual Activity: Yes    Birth Control/ Protection: Surgical   Other Topics Concern  . Not on file   Social History Narrative  . No narrative on file    Review of Systems: Constitutional: Negative for fever, chills, diaphoresis, activity change, appetite change and fatigue. HENT: Negative for ear pain, nosebleeds, congestion, facial swelling, rhinorrhea, neck pain, neck stiffness and ear discharge.  Eyes: Negative for pain, discharge, redness, itching and visual disturbance. Respiratory: Negative for cough, choking, chest tightness, shortness of breath, wheezing and stridor.  Cardiovascular: Negative for chest pain, palpitations and leg swelling. Gastrointestinal: Negative for abdominal distention. Genitourinary: Negative for dysuria, urgency, frequency, hematuria, flank pain, decreased urine volume,  difficulty urinating and dyspareunia.  Musculoskeletal: Negative for back pain, joint swelling, arthralgias and gait problem. Neurological: Negative for dizziness, tremors, seizures, syncope, facial asymmetry, speech difficulty, weakness, light-headedness, numbness and headaches.  Hematological: Negative for adenopathy. Does not bruise/bleed easily.     Objective:   Filed Vitals:   01/18/14 1013  BP: 121/82  Pulse: 99  Temp: 98.6 F (37 C)  Resp: 16    Physical Exam: Constitutional: Patient appears well-developed and well-nourished. No distress. Neck: Normal ROM. Neck supple. No JVD. No tracheal deviation. No thyromegaly. CVS: RRR, S1/S2 +, no  murmurs, no gallops, no carotid bruit.  Pulmonary: Effort and breath sounds normal, no stridor, rhonchi, wheezes, rales.  Abdominal: Soft. BS +,  no distension, tenderness, rebound or guarding.  Musculoskeletal: Normal range of motion. No edema and no tenderness.  Lymphadenopathy: No lymphadenopathy noted, cervical,  Neuro: Alert. Normal reflexes, muscle tone coordination. No cranial nerve deficit. Skin: Skin is warm and dry. No rash noted. Not diaphoretic. No erythema. No pallor. Psychiatric: Normal mood and affect. Behavior, judgment, thought content normal.  Lab Results  Component Value Date   WBC 9.8 10/15/2013   HGB 14.1 10/15/2013   HCT 40.4 10/15/2013   MCV 84.3 10/15/2013   PLT 311 10/15/2013   Lab Results  Component Value Date   CREATININE 0.72 10/15/2013   BUN 12 10/15/2013   NA 142 10/15/2013   K 3.9 10/15/2013   CL 102 10/15/2013   CO2 29 10/15/2013    Lab Results  Component Value Date   HGBA1C 5.9 10/15/2013   Lipid Panel     Component Value Date/Time   CHOL 244* 10/15/2013 1243   TRIG 154* 10/15/2013 1243   HDL 41 10/15/2013 1243   CHOLHDL 6.0 10/15/2013 1243   VLDL 31 10/15/2013 1243   LDLCALC 172* 10/15/2013 1243       Assessment and plan:   [name] was seen today for follow-up.  Diagnoses and associated orders for this visit:  Essential hypertension Continue current medication Tobacco use disorder  Anxiety state, unspecified - hydrOXYzine (VISTARIL) 25 MG capsule; Take 1 capsule (25 mg total) by mouth at bedtime as needed for anxiety. Increased Lexapro to 20 mg QD.    Return in about 3 months (around 04/20/2014) for Hypertension.        Chari Manning, [facility] [phone] 01/18/2014, 10:22 AM

## 2014-02-08 ENCOUNTER — Emergency Department (HOSPITAL_COMMUNITY): Payer: Self-pay

## 2014-02-08 ENCOUNTER — Emergency Department (HOSPITAL_COMMUNITY)
Admission: EM | Admit: 2014-02-08 | Discharge: 2014-02-09 | Disposition: A | Discharge status: Home / Self Care | Payer: Self-pay | Attending: Emergency Medicine | Admitting: Emergency Medicine

## 2014-02-08 ENCOUNTER — Encounter (HOSPITAL_COMMUNITY): Payer: Self-pay | Admitting: Emergency Medicine

## 2014-02-08 DIAGNOSIS — Z79899 Other long term (current) drug therapy: Secondary | ICD-10-CM | POA: Insufficient documentation

## 2014-02-08 DIAGNOSIS — N390 Urinary tract infection, site not specified: Secondary | ICD-10-CM | POA: Insufficient documentation

## 2014-02-08 DIAGNOSIS — F17200 Nicotine dependence, unspecified, uncomplicated: Secondary | ICD-10-CM | POA: Insufficient documentation

## 2014-02-08 DIAGNOSIS — G43909 Migraine, unspecified, not intractable, without status migrainosus: Secondary | ICD-10-CM | POA: Insufficient documentation

## 2014-02-08 DIAGNOSIS — K8020 Calculus of gallbladder without cholecystitis without obstruction: Secondary | ICD-10-CM | POA: Insufficient documentation

## 2014-02-08 DIAGNOSIS — I10 Essential (primary) hypertension: Secondary | ICD-10-CM | POA: Insufficient documentation

## 2014-02-08 DIAGNOSIS — R0602 Shortness of breath: Secondary | ICD-10-CM | POA: Insufficient documentation

## 2014-02-08 DIAGNOSIS — R112 Nausea with vomiting, unspecified: Secondary | ICD-10-CM | POA: Insufficient documentation

## 2014-02-08 MED ORDER — SODIUM CHLORIDE 0.9 % IV BOLUS (SEPSIS)
1000.0000 mL | Freq: Once | INTRAVENOUS | Status: AC
  Administered 2014-02-09: 1000 mL via INTRAVENOUS

## 2014-02-08 MED ORDER — ONDANSETRON 4 MG PO TBDP
8.0000 mg | ORAL_TABLET | Freq: Once | ORAL | Status: AC
  Administered 2014-02-08: 8 mg via ORAL
  Filled 2014-02-08: qty 2

## 2014-02-08 NOTE — ED Notes (Signed)
Pt reports having productive cough with white sputum for over a week, having back pain and now feels sob and like abd is distended. No acute distress noted at triage and spo2 99%,

## 2014-02-08 NOTE — ED Provider Notes (Signed)
CSN: [id number]     Arrival date & time 02/08/14  1832 History   First MD Initiated Contact with Patient 02/08/14 2252     Chief Complaint  Patient presents with  . Cough  . Shortness of Breath     (Consider location/radiation/quality/duration/timing/severity/associated sxs/prior Treatment) HPI Ms. Bobst is a 48 year old female past medical history of hypertension, gallstones who presents to the ER tonight with abdominal pain, nausea, vomiting. Patient states she is sitting upright in bed at home which he noted a "bloated sensation" with pain in her right upper quadrant and epigastric region which began acutely at around 5 PM tonight. Patient states the pain was constant, however waxed and waned. She described the pain as a "cramping" feeling which radiated to her back. Patient describes associated nausea and vomiting with the pain. Patient reports mild shortness of breath, however states she has been experiencing upper respiratory infection symptoms for the past week including a productive cough. Patient states this is a normal occurrence for this time of year, and she is not as concerned about her URI symptoms right now, and more concerned about her acute abdominal pain. She denies chest pain, weakness, dizziness, palpitations, dysuria, fever.  Past Medical History  Diagnosis Date  . Hypertension   . Migraines    Past Surgical History  Procedure Laterality Date  . Cesarean section    . Tubal ligation     Family History  Problem Relation Age of Onset  . Cancer Mother   . Drug abuse Mother   . Heart disease Mother   . Hypertension Mother   . Diabetes Mother   . Cancer Father   . Diabetes Sister   . Diabetes Sister    History  Substance Use Topics  . Smoking status: Current Every Day Smoker    Types: Cigarettes  . Smokeless tobacco: Not on file  . Alcohol Use: Yes   OB History   Grav Para Term Preterm Abortions TAB SAB Ect Mult Living                 Review of Systems   Constitutional: Negative for fever.  HENT: Negative for trouble swallowing.   Eyes: Negative for visual disturbance.  Respiratory: Positive for cough and shortness of breath.   Cardiovascular: Negative for chest pain and palpitations.  Gastrointestinal: Positive for nausea, vomiting, abdominal pain and abdominal distention. Negative for blood in stool.  Genitourinary: Negative for dysuria, urgency, hematuria, flank pain, vaginal bleeding, vaginal discharge and vaginal pain.  Musculoskeletal: Negative for neck pain and neck stiffness.  Skin: Negative for rash.  Neurological: Negative for dizziness, syncope, weakness, light-headedness and numbness.  Psychiatric/Behavioral: Negative.       Allergies  Sulfa antibiotics  Home Medications   Prior to Admission medications   Medication Sig Start Date End Date Taking? Authorizing Provider  escitalopram (LEXAPRO) 20 MG tablet Take 1 tablet (20 mg total) by mouth daily. 12/10/13  Yes Lance Bosch, NP  hydrochlorothiazide (HYDRODIURIL) 25 MG tablet Take 25 mg by mouth daily. 10/15/13  Yes Lance Bosch, NP  hydrOXYzine (VISTARIL) 25 MG capsule Take 25 mg by mouth at bedtime as needed for anxiety. 01/18/14  Yes Lance Bosch, NP  ibuprofen (ADVIL,MOTRIN) 200 MG tablet Take 800 mg by mouth every 6 (six) hours as needed. For pain.   Yes Historical Provider, MD  lisinopril (PRINIVIL,ZESTRIL) 40 MG tablet Take 40 mg by mouth daily. 10/15/13  Yes Lance Bosch, NP  penicillin v potassium (VEETID)  500 MG tablet Take 500 mg by mouth daily as needed. For infection 04/03/13  Yes Hannah Muthersbaugh, PA-C  simvastatin (ZOCOR) 20 MG tablet Take 1 tablet (20 mg total) by mouth at bedtime. 10/16/13  Yes Lance Bosch, NP   BP 110/85  Pulse 70  Temp(Src) 98.4 F (36.9 C) (Oral)  Resp 19  SpO2 98% Physical Exam  Nursing note and vitals reviewed. Constitutional: She is oriented to person, place, and time. Vital signs are normal. She appears well-developed  and well-nourished. No distress.  Patient sitting upright on stretcher speaking in full, clear sentences in no acute distress.   HENT:  Head: Normocephalic and atraumatic.  Mouth/Throat: Oropharynx is clear and moist. No oropharyngeal exudate.  Eyes: Right eye exhibits no discharge. Left eye exhibits no discharge. No scleral icterus.  Neck: Normal range of motion.  Cardiovascular: Normal rate, regular rhythm and normal heart sounds.   No murmur heard. Pulmonary/Chest: Effort normal and breath sounds normal. No respiratory distress.  Abdominal: Soft. Normal appearance and bowel sounds are normal. There is tenderness in the right upper quadrant and epigastric area. There is no rigidity, no rebound, no guarding, no tenderness at McBurney's point and negative Murphy's sign.  Musculoskeletal: Normal range of motion. She exhibits no edema and no tenderness.  Neurological: She is alert and oriented to person, place, and time. No cranial nerve deficit. Coordination normal.  Skin: Skin is warm and dry. No rash noted. She is not diaphoretic.  Psychiatric: She has a normal mood and affect.    ED Course  Procedures (including critical care time) Labs Review Labs Reviewed  COMPREHENSIVE METABOLIC PANEL - Abnormal; Notable for the following:    Glucose, Bld 129 (*)    AST 161 (*)    ALT 127 (*)    All other components within normal limits  URINALYSIS, ROUTINE W REFLEX MICROSCOPIC - Abnormal; Notable for the following:    Nitrite POSITIVE (*)    Leukocytes, UA SMALL (*)    All other components within normal limits  URINE MICROSCOPIC-ADD ON - Abnormal; Notable for the following:    Squamous Epithelial / LPF FEW (*)    Bacteria, UA FEW (*)    All other components within normal limits  CBC WITH DIFFERENTIAL  LIPASE, BLOOD  I-STAT TROPOININ, ED    Imaging Review Dg Chest 2 View  02/08/2014   CLINICAL DATA:  Cough, congestion.  EXAM: CHEST  2 VIEW  COMPARISON:  05/11/2009  FINDINGS: The heart  size and mediastinal contours are within normal limits. Both lungs are clear. The visualized skeletal structures are unremarkable.  IMPRESSION: No active cardiopulmonary disease.   Electronically Signed   By: Rolm Baptise M.D.   On: 02/08/2014 19:42     EKG Interpretation   Date/Time:  Friday February 08 2014 18:46:43 EDT Ventricular Rate:  73 PR Interval:  152 QRS Duration: 80 QT Interval:  412 QTC Calculation: 453 R Axis:   65 Text Interpretation:  Normal sinus rhythm Non-specific ST-t changes No old  tracing to compare Confirmed by [place]  MD, [place] ([id number]) on 02/09/2014  12:23:37 AM      MDM   Final diagnoses:  None    Patient is a 48 year old female with past medical history of hypertension, gallstones presenting with upper abdominal pain. When asked where her pain originates patient points in her right upper quadrant. Patient was sitting at home in bed when her pain started, describes it as a colicky pain which comes and goes,  states she felt her abdomen was "bloated" in her pain radiated to her back. Her pain was a 10 out of 10 initially. Patient had associated nausea, vomiting. Initial history concerning for a biliary colic. Patient also describing some mild shortness of breath which she relates to a upper respiratory infection for the past week. EKG shows sinus rhythm with no acute injury or ectopy, i-STAT troponin negative, patient not dyspneic on exam, pain reproducible in the epigastric and right upper quadrant region. Patient appears nontoxic on exam and is comfortable during interview sitting on the bed. Patient states her pain has mostly subsided after ODT Zofran given at triage.  At this time I'm not concerned about a cardiac etiology I believe this is more abdominal in nature.  Further workup to include CBC, CMP, UA, lipase, at US abdomen complete.  1:22 AM: Patient's labs pending at this time. Patient comfortable in bed after morphine with mild "soreness" in her upper  abdomen.  No nausea no vomiting. Patient states her pain is "not nearly what it was before".  Plan:  Followup on US abdomen complete and labs. Follow up with surgery if ultrasound remarkable for any acute pathology. If ultrasound negative, have patient followup with primary care and/or surgery as outpatient. Patient signed out to [employer], PA-C.     Signed,  Dahlia Bailiff, PA-C 1:40 AM   This patient seen and discussed with Dr. Virgel Manifold, M.D.    Carrie Mew, PA-C 02/09/14 [phone]

## 2014-02-09 ENCOUNTER — Emergency Department (HOSPITAL_COMMUNITY): Payer: Self-pay

## 2014-02-09 LAB — COMPREHENSIVE METABOLIC PANEL
ALBUMIN: 3.7 g/dL (ref 3.5–5.2)
ALT: 127 U/L — AB (ref 0–35)
AST: 161 U/L — ABNORMAL HIGH (ref 0–37)
Alkaline Phosphatase: 90 U/L (ref 39–117)
Anion gap: 14 (ref 5–15)
BUN: 14 mg/dL (ref 6–23)
CO2: 27 mEq/L (ref 19–32)
CREATININE: 0.68 mg/dL (ref 0.50–1.10)
Calcium: 9.9 mg/dL (ref 8.4–10.5)
Chloride: 98 mEq/L (ref 96–112)
GFR calc Af Amer: >90 mL/min (ref >90)
Glucose, Bld: 129 mg/dL — ABNORMAL HIGH (ref 70–99)
Potassium: 4.1 mEq/L (ref 3.7–5.3)
Sodium: 139 mEq/L (ref 137–147)
TOTAL PROTEIN: 7.8 g/dL (ref 6.0–8.3)
Total Bilirubin: 0.3 mg/dL (ref 0.3–1.2)

## 2014-02-09 LAB — URINALYSIS, ROUTINE W REFLEX MICROSCOPIC
Bilirubin Urine: NEGATIVE
GLUCOSE, UA: NEGATIVE mg/dL
HGB URINE DIPSTICK: NEGATIVE
Ketones, ur: NEGATIVE mg/dL
Nitrite: POSITIVE — AB
PH: 5.5 (ref 5.0–8.0)
PROTEIN: NEGATIVE mg/dL
SPECIFIC GRAVITY, URINE: 1.016 (ref 1.005–1.030)
Urobilinogen, UA: 0.2 mg/dL (ref 0.0–1.0)

## 2014-02-09 LAB — CBC WITH DIFFERENTIAL/PLATELET
BASOS PCT: 1 % (ref 0–1)
Basophils Absolute: 0 10*3/uL (ref 0.0–0.1)
EOS ABS: 0.1 10*3/uL (ref 0.0–0.7)
EOS PCT: 1 % (ref 0–5)
HEMATOCRIT: 39.9 % (ref 36.0–46.0)
Hemoglobin: 13.5 g/dL (ref 12.0–15.0)
LYMPHS PCT: 33 % (ref 12–46)
Lymphs Abs: 2.7 10*3/uL (ref 0.7–4.0)
MCH: 29.9 pg (ref 26.0–34.0)
MCHC: 33.8 g/dL (ref 30.0–36.0)
MCV: 88.3 fL (ref 78.0–100.0)
MONO ABS: 0.4 10*3/uL (ref 0.1–1.0)
Monocytes Relative: 5 % (ref 3–12)
Neutro Abs: 5 10*3/uL (ref 1.7–7.7)
Neutrophils Relative %: 60 % (ref 43–77)
Platelets: 265 10*3/uL (ref 150–400)
RBC: 4.52 MIL/uL (ref 3.87–5.11)
RDW: 13 % (ref 11.5–15.5)
WBC: 8.2 10*3/uL (ref 4.0–10.5)

## 2014-02-09 LAB — I-STAT TROPONIN, ED: TROPONIN I, POC: 0 ng/mL (ref 0.00–0.08)

## 2014-02-09 LAB — URINE MICROSCOPIC-ADD ON

## 2014-02-09 LAB — LIPASE, BLOOD: LIPASE: 55 U/L (ref 11–59)

## 2014-02-09 MED ORDER — ONDANSETRON HCL 4 MG/2ML IJ SOLN
4.0000 mg | Freq: Once | INTRAMUSCULAR | Status: AC
  Administered 2014-02-09: 4 mg via INTRAVENOUS
  Filled 2014-02-09: qty 2

## 2014-02-09 MED ORDER — MORPHINE SULFATE 4 MG/ML IJ SOLN
4.0000 mg | Freq: Once | INTRAMUSCULAR | Status: AC
  Administered 2014-02-09: 4 mg via INTRAVENOUS
  Filled 2014-02-09: qty 1

## 2014-02-09 MED ORDER — NITROFURANTOIN MONOHYD MACRO 100 MG PO CAPS: 100.0000 mg | ORAL_CAPSULE | Freq: Two times a day (BID) | ORAL | Status: DC

## 2014-02-09 MED ORDER — HYDROCODONE-ACETAMINOPHEN 5-325 MG PO TABS
1.0000 | ORAL_TABLET | ORAL | Status: DC | PRN
Start: 2014-02-09 — End: 2015-10-21

## 2014-02-09 MED ORDER — PROMETHAZINE HCL 25 MG PO TABS: 25.0000 mg | ORAL_TABLET | Freq: Four times a day (QID) | ORAL | Status: DC | PRN

## 2014-02-09 NOTE — ED Provider Notes (Signed)
Medical screening examination/treatment/procedure(s) were performed by non-physician practitioner and as supervising physician I was immediately available for consultation/collaboration.   EKG Interpretation   Date/Time:  Friday February 08 2014 18:46:43 EDT Ventricular Rate:  73 PR Interval:  152 QRS Duration: 80 QT Interval:  412 QTC Calculation: 453 R Axis:   65 Text Interpretation:  Normal sinus rhythm Non-specific ST-t changes No old  tracing to compare Confirmed by Wilson Singer  MD, [place] ([id number]) on 02/09/2014  12:23:37 AM        Everlene Balls, MD 02/09/14 1714

## 2014-02-09 NOTE — Discharge Instructions (Signed)
You were found to have some gallstones present in your gallbladder. There also found to have a urinary tract infection. Please take antibiotics for your urinary tract infection and followup with a general surgeon for continued evaluation of your abdominal pain and gallstones. Return any time for changing or worsening symptoms.    Cholelithiasis Cholelithiasis (also called gallstones) is a form of gallbladder disease in which gallstones form in your gallbladder. The gallbladder is an organ that stores bile made in the liver, which helps digest fats. Gallstones begin as small crystals and slowly grow into stones. Gallstone pain occurs when the gallbladder spasms and a gallstone is blocking the duct. Pain can also occur when a stone passes out of the duct.  RISK FACTORS  Being female.   Having multiple pregnancies. Health care providers sometimes advise removing diseased gallbladders before future pregnancies.   Being obese.  Eating a diet heavy in fried foods and fat.   Being older than 65 years and increasing age.   Prolonged use of medicines containing female hormones.   Having diabetes mellitus.   Rapidly losing weight.   Having a family history of gallstones (heredity).  SYMPTOMS  Nausea.   Vomiting.  Abdominal pain.   Yellowing of the skin (jaundice).   Sudden pain. It may persist from several minutes to several hours.  Fever.   Tenderness to the touch. In some cases, when gallstones do not move into the bile duct, people have no pain or symptoms. These are called "silent" gallstones.  TREATMENT Silent gallstones do not need treatment. In severe cases, emergency surgery may be required. Options for treatment include:  Surgery to remove the gallbladder. This is the most common treatment.  Medicines. These do not always work and may take 6-12 months or more to work.  Shock wave treatment (extracorporeal biliary lithotripsy). In this treatment an  ultrasound machine sends shock waves to the gallbladder to break gallstones into smaller pieces that can pass into the intestines or be dissolved by medicine. HOME CARE INSTRUCTIONS   Only take over-the-counter or prescription medicines for pain, discomfort, or fever as directed by your health care provider.   Follow a low-fat diet until seen again by your health care provider. Fat causes the gallbladder to contract, which can result in pain.   Follow up with your health care provider as directed. Attacks are almost always recurrent and surgery is usually required for permanent treatment.  SEEK IMMEDIATE MEDICAL CARE IF:   Your pain increases and is not controlled by medicines.   You have a fever or persistent symptoms for more than 2-3 days.   You have a fever and your symptoms suddenly get worse.   You have persistent nausea and vomiting.  MAKE SURE YOU:   Understand these instructions.  Will watch your condition.  Will get help right away if you are not doing well or get worse. Document Released: 05/27/2005 Document Revised: 01/31/2013 Document Reviewed: 11/22/2012 [facility] Patient Information 2015 [place], [place]. This information is not intended to replace advice given to you by your health care provider. Make sure you discuss any questions you have with your health care provider.    Urinary Tract Infection A urinary tract infection (UTI) can occur any place along the urinary tract. The tract includes the kidneys, ureters, bladder, and urethra. A type of germ called bacteria often causes a UTI. UTIs are often helped with antibiotic medicine.  HOME CARE   If given, take antibiotics as told by your doctor. [employer]  them even if you start to feel better.  Drink enough fluids to keep your pee (urine) clear or pale yellow.  Avoid tea, drinks with caffeine, and bubbly (carbonated) drinks.  Pee often. Avoid holding your pee in for a long time.  Pee before and after having  sex (intercourse).  Wipe from front to back after you poop (bowel movement) if you are a woman. Use each tissue only once. GET HELP RIGHT AWAY IF:   You have back pain.  You have lower belly (abdominal) pain.  You have chills.  You feel sick to your stomach (nauseous).  You throw up (vomit).  Your burning or discomfort with peeing does not go away.  You have a fever.  Your symptoms are not better in 3 days. MAKE SURE YOU:   Understand these instructions.  Will watch your condition.  Will get help right away if you are not doing well or get worse. Document Released: 11/17/2007 Document Revised: 02/23/2012 Document Reviewed: 12/30/2011 [facility] Patient Information 2015 [place], [place]. This information is not intended to replace advice given to you by your health care provider. Make sure you discuss any questions you have with your health care provider.

## 2014-02-09 NOTE — ED Notes (Signed)
Pt alert, NAD, calm, interactive, resps e/u, speaking in clear complete sentences, VSS, reading book, waiting for [place]. Family at [facility]. Pt has been ambulatory to b/r with steady gait.

## 2014-02-09 NOTE — ED Provider Notes (Signed)
Courtney Dixon S 1:00AM patient discussed and signed out. Patient with upper abdominal pains also complaining of some cough. CMP and abdominal ultrasound pending. Patient currently comfortable after medications. Pending lab tests and results will distal patient.  3:00 AM patient continues to feel comfortable. There are signs of cholelithiasis without secondary signs of acute cholecystitis. Patient does have slight elevation of AST and ALT without any other LFT abnormalities. Normal WBC. She continues to feel well and at this time wished to return home. We'll provide general surgery referral. She also signs of UTI. Antibiotics prescribed. Strict return precautions given.  Martie Lee, PA-C 02/09/14 [phone]

## 2014-02-12 NOTE — ED Provider Notes (Signed)
Medical screening examination/treatment/procedure(s) were performed by non-physician practitioner and as supervising physician I was immediately available for consultation/collaboration.   EKG Interpretation   Date/Time:  Friday February 08 2014 18:46:43 EDT Ventricular Rate:  73 PR Interval:  152 QRS Duration: 80 QT Interval:  412 QTC Calculation: 453 R Axis:   65 Text Interpretation:  Normal sinus rhythm Non-specific ST-t changes No old  tracing to compare Confirmed by Wilson Singer  MD, [place] ([id number]) on 02/09/2014  12:23:37 AM       Virgel Manifold, MD 02/12/14 1517

## 2014-02-13 ENCOUNTER — Ambulatory Visit: Payer: Self-pay

## 2014-02-26 ENCOUNTER — Other Ambulatory Visit: Payer: Self-pay | Admitting: Internal Medicine

## 2014-03-04 ENCOUNTER — Telehealth: Payer: Self-pay | Admitting: Internal Medicine

## 2014-03-04 NOTE — Telephone Encounter (Signed)
Pt called to request a refill on escitalopram (LEXAPRO) 20 MG tablet. Pt. States that she is highly irritated and would like a refill. Please f/u with pt

## 2014-03-05 NOTE — Telephone Encounter (Signed)
May send refill of 30 tablets with two refills. If the lexapro is not working she may need referral to [employer].

## 2014-03-05 NOTE — Telephone Encounter (Signed)
Pt requesting refill Lexapro. Please f/u

## 2014-03-06 ENCOUNTER — Other Ambulatory Visit: Payer: Self-pay | Admitting: Emergency Medicine

## 2014-03-06 ENCOUNTER — Telehealth: Payer: Self-pay | Admitting: Emergency Medicine

## 2014-03-06 MED ORDER — ESCITALOPRAM OXALATE 20 MG PO TABS: 20.0000 mg | ORAL_TABLET | Freq: Every day | ORAL | Status: DC

## 2014-03-06 NOTE — Telephone Encounter (Signed)
Medication Lexapro reordered with 2 refills; e-scribed to [facility]

## 2014-03-19 ENCOUNTER — Other Ambulatory Visit: Payer: Self-pay | Admitting: Internal Medicine

## 2014-03-27 ENCOUNTER — Other Ambulatory Visit: Payer: Self-pay | Admitting: Internal Medicine

## 2014-03-28 ENCOUNTER — Telehealth: Payer: Self-pay | Admitting: Internal Medicine

## 2014-03-28 MED ORDER — HYDROCHLOROTHIAZIDE 25 MG PO TABS: 25.0000 mg | ORAL_TABLET | Freq: Every day | ORAL | Status: DC

## 2014-03-28 MED ORDER — LISINOPRIL 40 MG PO TABS: 40.0000 mg | ORAL_TABLET | Freq: Every day | ORAL | Status: DC

## 2014-03-28 NOTE — Telephone Encounter (Signed)
Pt. Called stating that she needs a refill for her blood pressure medication. Pt. Has been out of medication for five days. Please f/u with pt.

## 2014-03-28 NOTE — Telephone Encounter (Signed)
Pt aware Refills e-scribe to our pharmacy Rx HCTZ and Lisinoprl

## 2014-04-18 ENCOUNTER — Ambulatory Visit: Payer: Self-pay | Admitting: Internal Medicine

## 2014-04-26 ENCOUNTER — Ambulatory Visit: Payer: Self-pay | Admitting: Internal Medicine

## 2014-05-03 ENCOUNTER — Ambulatory Visit: Payer: Self-pay | Attending: Internal Medicine | Admitting: Internal Medicine

## 2014-05-03 ENCOUNTER — Encounter: Payer: Self-pay | Admitting: Internal Medicine

## 2014-05-03 VITALS — BP 138/88 | HR 76 | Temp 99.1°F | Resp 16 | Ht 64.0 in | Wt 216.0 lb

## 2014-05-03 DIAGNOSIS — I10 Essential (primary) hypertension: Secondary | ICD-10-CM | POA: Insufficient documentation

## 2014-05-03 DIAGNOSIS — F419 Anxiety disorder, unspecified: Secondary | ICD-10-CM | POA: Insufficient documentation

## 2014-05-03 DIAGNOSIS — R7989 Other specified abnormal findings of blood chemistry: Secondary | ICD-10-CM | POA: Insufficient documentation

## 2014-05-03 DIAGNOSIS — F17210 Nicotine dependence, cigarettes, uncomplicated: Secondary | ICD-10-CM | POA: Insufficient documentation

## 2014-05-03 DIAGNOSIS — Z79899 Other long term (current) drug therapy: Secondary | ICD-10-CM | POA: Insufficient documentation

## 2014-05-03 DIAGNOSIS — R799 Abnormal finding of blood chemistry, unspecified: Secondary | ICD-10-CM

## 2014-05-03 LAB — COMPLETE METABOLIC PANEL WITH GFR
ALT: 24 U/L (ref 0–35)
AST: 22 U/L (ref 0–37)
Albumin: 3.9 g/dL (ref 3.5–5.2)
Alkaline Phosphatase: 59 U/L (ref 39–117)
BILIRUBIN TOTAL: 0.3 mg/dL (ref 0.2–1.2)
BUN: 12 mg/dL (ref 6–23)
CO2: 30 mEq/L (ref 19–32)
Calcium: 9.2 mg/dL (ref 8.4–10.5)
Chloride: 103 mEq/L (ref 96–112)
Creat: 0.79 mg/dL (ref 0.50–1.10)
GFR, EST NON AFRICAN AMERICAN: 89 mL/min
GFR, Est African American: >89 mL/min
GLUCOSE: 116 mg/dL — AB (ref 70–99)
Potassium: 5 mEq/L (ref 3.5–5.3)
Sodium: 142 mEq/L (ref 135–145)
TOTAL PROTEIN: 6.5 g/dL (ref 6.0–8.3)

## 2014-05-03 MED ORDER — LORAZEPAM 0.5 MG PO TABS: 0.5000 mg | ORAL_TABLET | Freq: Every evening | ORAL | Status: DC | PRN

## 2014-05-03 MED ORDER — HYDROCHLOROTHIAZIDE 25 MG PO TABS
25.0000 mg | ORAL_TABLET | Freq: Every day | ORAL | Status: DC
Start: 2014-05-03 — End: 2014-10-15

## 2014-05-03 MED ORDER — ESCITALOPRAM OXALATE 20 MG PO TABS: 20.0000 mg | ORAL_TABLET | Freq: Every day | ORAL | Status: DC

## 2014-05-03 MED ORDER — LISINOPRIL 40 MG PO TABS: 40.0000 mg | ORAL_TABLET | Freq: Every day | ORAL | Status: DC

## 2014-05-03 NOTE — Progress Notes (Signed)
Patient ID: Courtney Dixon, female   DOB: 1965-08-12, 48 y.o.   MRN: [id number]  CC: HTN follow up  HPI:  Patient presents to clinic today for a follow up of hypertension.  She reports that she has been out of her blood pressure medication for seven days due to waiting for a appointment.  She states that she went to give plasma the earlier this week and her BP was 150/92. She does continue to smoke daily.  She reports that she is continuing to have issues with her anxiety and feels like the lexapro and hydroxyzine does not help her.  She states that she does not like the hydroxyzine due to it makes her feel more nervous and jittery.  She reports that she has been using benadryl for anxiety.   She reports that she was seen in the ER 6 weeks ago due to gallstones and UTI.  She reports that she never got her medication filled because it cost so much.   Allergies  Allergen Reactions  . Sulfa Antibiotics Itching   Past Medical History  Diagnosis Date  . Hypertension   . Migraines    Current Outpatient Prescriptions on File Prior to Visit  Medication Sig Dispense Refill  . escitalopram (LEXAPRO) 20 MG tablet Take 1 tablet (20 mg total) by mouth daily. 30 tablet 2  . ibuprofen (ADVIL,MOTRIN) 200 MG tablet Take 800 mg by mouth every 6 (six) hours as needed. For pain.    . simvastatin (ZOCOR) 20 MG tablet Take 1 tablet (20 mg total) by mouth at bedtime. 30 tablet 3  . hydrochlorothiazide (HYDRODIURIL) 25 MG tablet Take 1 tablet (25 mg total) by mouth daily. 30 tablet 0  . HYDROcodone-acetaminophen (NORCO/VICODIN) 5-325 MG per tablet Take 1-2 tablets by mouth every 4 (four) hours as needed for moderate pain. 20 tablet 0  . hydrOXYzine (VISTARIL) 25 MG capsule Take 25 mg by mouth at bedtime as needed for anxiety.    Marland Kitchen lisinopril (PRINIVIL,ZESTRIL) 40 MG tablet Take 1 tablet (40 mg total) by mouth daily. 30 tablet 0  . nitrofurantoin, macrocrystal-monohydrate, (MACROBID) 100 MG capsule Take 1 capsule (100  mg total) by mouth 2 (two) times daily. X 7 days 14 capsule 0  . penicillin v potassium (VEETID) 500 MG tablet Take 500 mg by mouth daily as needed. For infection    . promethazine (PHENERGAN) 25 MG tablet Take 1 tablet (25 mg total) by mouth every 6 (six) hours as needed for nausea. 20 tablet 0   No current facility-administered medications on file prior to visit.   Family History  Problem Relation Age of Onset  . Cancer Mother   . Drug abuse Mother   . Heart disease Mother   . Hypertension Mother   . Diabetes Mother   . Cancer Father   . Diabetes Sister   . Diabetes Sister    History   Social History  . Marital Status: Married    Spouse Name: N/A    Number of Children: N/A  . Years of Education: N/A   Occupational History  . Not on file.   Social History Main Topics  . Smoking status: Current Every Day Smoker    Types: Cigarettes  . Smokeless tobacco: Not on file  . Alcohol Use: Yes  . Drug Use: No  . Sexual Activity: Yes    Birth Control/ Protection: Surgical   Other Topics Concern  . Not on file   Social History Narrative    Review of  Systems  Gastrointestinal: Positive for abdominal pain (resolved). Negative for nausea.  Psychiatric/Behavioral: Positive for substance abuse. Negative for depression and suicidal ideas. The patient is nervous/anxious.   All other systems reviewed and are negative.     Objective:   Filed Vitals:   05/03/14 1140  BP: 138/88  Pulse: 76  Temp: 99.1 F (37.3 C)  Resp: 16    Physical Exam  Constitutional: She is oriented to person, place, and time.  Cardiovascular: Normal rate, regular rhythm and normal heart sounds.   Pulmonary/Chest: Effort normal and breath sounds normal.  Abdominal: Soft. Bowel sounds are normal.  Musculoskeletal: She exhibits no edema.  Neurological: She is alert and oriented to person, place, and time.  Skin: Skin is warm and dry.     Lab Results  Component Value Date   WBC 8.2 02/09/2014    HGB 13.5 02/09/2014   HCT 39.9 02/09/2014   MCV 88.3 02/09/2014   PLT 265 02/09/2014   Lab Results  Component Value Date   CREATININE 0.68 02/09/2014   BUN 14 02/09/2014   NA 139 02/09/2014   K 4.1 02/09/2014   CL 98 02/09/2014   CO2 27 02/09/2014    Lab Results  Component Value Date   HGBA1C 5.9 10/15/2013   Lipid Panel     Component Value Date/Time   CHOL 244* 10/15/2013 1243   TRIG 154* 10/15/2013 1243   HDL 41 10/15/2013 1243   CHOLHDL 6.0 10/15/2013 1243   VLDL 31 10/15/2013 1243   LDLCALC 172* 10/15/2013 1243       Assessment and plan:   Courtney Dixon was seen today for follow-up.  Diagnoses and associated orders for this visit:  Essential hypertension - lisinopril (PRINIVIL,ZESTRIL) 40 MG tablet; Take 1 tablet (40 mg total) by mouth daily. - hydrochlorothiazide (HYDRODIURIL) 25 MG tablet; Take 1 tablet (25 mg total) by mouth daily.  Abnormal blood chemistry - COMPLETE METABOLIC PANEL WITH GFR - Hemoglobin A1c - Hepatitis panel, acute   Had elevated LFTS in august, will recheck today and for hep.  If negative for hep will possibly need to d/c simvastatin for a few months and repeat LFT to see if she has any improvement.   Anxiety - escitalopram (LEXAPRO) 20 MG tablet; Take 1 tablet (20 mg total) by mouth daily. - LORazepam (ATIVAN) 0.5 MG tablet; Take 1 tablet (0.5 mg total) by mouth at bedtime as needed for anxiety.   Return in about 6 months (around 11/01/2014) for Hypertension.    Chari Manning, [facility] [phone] 05/03/2014, 11:54 AM

## 2014-05-03 NOTE — Patient Instructions (Addendum)
Smoking Cessation Quitting smoking is important to your health and has many advantages. However, it is not always easy to quit since nicotine is a very addictive drug. Oftentimes, people try 3 times or more before being able to quit. This document explains the best ways for you to prepare to quit smoking. Quitting takes hard work and a lot of effort, but you can do it. ADVANTAGES OF QUITTING SMOKING  You will live longer, feel better, and live better.  Your body will feel the impact of quitting smoking almost immediately.  Within 20 minutes, blood pressure decreases. Your pulse returns to its normal level.  After 8 hours, carbon monoxide levels in the blood return to normal. Your oxygen level increases.  After 24 hours, the chance of having a heart attack starts to decrease. Your breath, hair, and body stop smelling like smoke.  After 48 hours, damaged nerve endings begin to recover. Your sense of taste and smell improve.  After 72 hours, the body is virtually free of nicotine. Your bronchial tubes relax and breathing becomes easier.  After 2 to 12 weeks, lungs can hold more air. Exercise becomes easier and circulation improves.  The risk of having a heart attack, stroke, cancer, or lung disease is greatly reduced.  After 1 year, the risk of coronary heart disease is cut in half.  After 5 years, the risk of stroke falls to the same as a nonsmoker.  After 10 years, the risk of lung cancer is cut in half and the risk of other cancers decreases significantly.  After 15 years, the risk of coronary heart disease drops, usually to the level of a nonsmoker.  If you are pregnant, quitting smoking will improve your chances of having a healthy baby.  The people you live with, especially any children, will be healthier.  You will have extra money to spend on things other than cigarettes. QUESTIONS TO THINK ABOUT BEFORE ATTEMPTING TO QUIT You may want to talk about your answers with your  health care provider.  Why do you want to quit?  If you tried to quit in the past, what helped and what did not?  What will be the most difficult situations for you after you quit? How will you plan to handle them?  Who can help you through the tough times? Your family? Friends? A health care provider?  What pleasures do you get from smoking? What ways can you still get pleasure if you quit? Here are some questions to ask your health care provider:  How can you help me to be successful at quitting?  What medicine do you think would be best for me and how should I take it?  What should I do if I need more help?  What is smoking withdrawal like? How can I get information on withdrawal? GET READY  Set a quit date.  Change your environment by getting rid of all cigarettes, ashtrays, matches, and lighters in your home, car, or work. Do not let people smoke in your home.  Review your past attempts to quit. Think about what worked and what did not. GET SUPPORT AND ENCOURAGEMENT You have a better chance of being successful if you have help. You can get support in many ways.  Tell your family, friends, and coworkers that you are going to quit and need their support. Ask them not to smoke around you.  Get individual, group, or telephone counseling and support. Programs are available at local hospitals and health centers. Call   your local health department for information about programs in your area.  Spiritual beliefs and practices may help some smokers quit.  Download a "quit meter" on your computer to keep track of quit statistics, such as how long you have gone without smoking, cigarettes not smoked, and money saved.  Get a self-help book about quitting smoking and staying off tobacco. [place] yourself from urges to smoke. Talk to someone, go for a walk, or occupy your time with a task.  Change your normal routine. Take a different route to work.  Drink tea instead of coffee. Eat breakfast in a different place.  Reduce your stress. Take a hot bath, exercise, or read a book.  Plan something enjoyable to do every day. Reward yourself for not smoking.  Explore interactive web-based programs that specialize in helping you quit. GET MEDICINE AND USE IT CORRECTLY Medicines can help you stop smoking and decrease the urge to smoke. Combining medicine with the above behavioral methods and support can greatly increase your chances of successfully quitting smoking.  Nicotine replacement therapy helps deliver nicotine to your body without the negative effects and risks of smoking. Nicotine replacement therapy includes nicotine gum, lozenges, inhalers, nasal sprays, and skin patches. Some may be available over-the-counter and others require a prescription.  Antidepressant medicine helps people abstain from smoking, but how this works is unknown. This medicine is available by prescription.  Nicotinic receptor partial agonist medicine simulates the effect of nicotine in your brain. This medicine is available by prescription. Ask your health care provider for advice about which medicines to use and how to use them based on your health history. Your health care provider will tell you what side effects to look out for if you choose to be on a medicine or therapy. Carefully read the information on the package. Do not use any other product containing nicotine while using a nicotine replacement product.  RELAPSE OR DIFFICULT SITUATIONS Most relapses occur within the first 3 months after quitting. Do not be discouraged if you start smoking again. Remember, most people try several times before finally quitting. You may have symptoms of withdrawal because your body is used to nicotine. You may crave cigarettes, be irritable, feel very hungry, cough often, get headaches, or have difficulty concentrating. The withdrawal symptoms are only temporary. They are strongest  when you first quit, but they will go away within 10-14 days. To reduce the chances of relapse, try to:  Avoid drinking alcohol. Drinking lowers your chances of successfully quitting.  Reduce the amount of caffeine you consume. Once you quit smoking, the amount of caffeine in your body increases and can give you symptoms, such as a rapid heartbeat, sweating, and anxiety.  Avoid smokers because they can make you want to smoke.  Do not let weight gain distract you. Many smokers will gain weight when they quit, usually less than 10 pounds. Eat a healthy diet and stay active. You can always lose the weight gained after you quit.  Find ways to improve your mood other than smoking. FOR MORE INFORMATION  www.smokefree.gov  Document Released: 05/25/2001 Document Revised: 10/15/2013 Document Reviewed: 09/09/2011 [facility] Patient Information 2015 [place], [place]. This information is not intended to replace advice given to you by your health care provider. Make sure you discuss any questions you have with your health care provider.  Generalized Anxiety Disorder Generalized anxiety disorder (GAD) is a mental disorder. It interferes with life functions, including relationships, work, and school.  GAD is different from normal anxiety, which everyone experiences at some point in their lives in response to specific life events and activities. Normal anxiety actually helps [place] prepare for and get through these life events and activities. Normal anxiety goes away after the event or activity is over.  GAD causes anxiety that is not necessarily related to specific events or activities. It also causes excess anxiety in proportion to specific events or activities. The anxiety associated with GAD is also difficult to control. GAD can vary from mild to severe. People with severe GAD can have intense waves of anxiety with physical symptoms (panic attacks).  SYMPTOMS The anxiety and worry associated with GAD are difficult  to control. This anxiety and worry are related to many life events and activities and also occur more days than not for 6 months or longer. People with GAD also have three or more of the following symptoms (one or more in children):  Restlessness.   Fatigue.  Difficulty concentrating.   Irritability.  Muscle tension.  Difficulty sleeping or unsatisfying sleep. DIAGNOSIS GAD is diagnosed through an assessment by your health care provider. Your health care provider will ask you questions aboutyour mood,physical symptoms, and events in your life. Your health care provider may ask you about your medical history and use of alcohol or drugs, including prescription medicines. Your health care provider may also do a physical exam and blood tests. Certain medical conditions and the use of certain substances can cause symptoms similar to those associated with GAD. Your health care provider may refer you to a mental health specialist for further evaluation. TREATMENT The following therapies are usually used to treat GAD:   Medication. Antidepressant medication usually is prescribed for long-term daily control. Antianxiety medicines may be added in severe cases, especially when panic attacks occur.   Talk therapy (psychotherapy). Certain types of talk therapy can be helpful in treating GAD by providing support, education, and guidance. A form of talk therapy called cognitive behavioral therapy can teach you healthy ways to think about and react to daily life events and activities.  Stress managementtechniques. These include yoga, meditation, and exercise and can be very helpful when they are practiced regularly. A mental health specialist can help determine which treatment is best for you. Some people see improvement with one therapy. However, other people require a combination of therapies. Document Released: 09/25/2012 Document Revised: 10/15/2013 Document Reviewed: 09/25/2012 [facility] Patient  Information 2015 [place], [place]. This information is not intended to replace advice given to you by your health care provider. Make sure you discuss any questions you have with your health care provider.

## 2014-05-03 NOTE — Progress Notes (Signed)
Pt is here following up on her HTN. Pt has been out of her medications for her BP for 7 days. Pt states that she needs something that will actually work for her anxiety.

## 2014-05-04 LAB — HEMOGLOBIN A1C
HEMOGLOBIN A1C: 6.4 % — AB (ref <5.7)
Mean Plasma Glucose: 137 mg/dL — ABNORMAL HIGH (ref <117)

## 2014-05-04 LAB — HEPATITIS PANEL, ACUTE
HCV Ab: NEGATIVE
HEP B S AG: NEGATIVE
Hep A IgM: NONREACTIVE
Hep B C IgM: NONREACTIVE

## 2014-05-06 ENCOUNTER — Telehealth: Payer: Self-pay | Admitting: Internal Medicine

## 2014-05-06 NOTE — Telephone Encounter (Signed)
Patient's husband, Mr. Jarquin calling to change callback number. Mr. Senseney states his wife is expecting a call from nurse with lab results from previous OV. Thank you.

## 2014-05-07 ENCOUNTER — Telehealth: Payer: Self-pay | Admitting: Emergency Medicine

## 2014-05-07 NOTE — Telephone Encounter (Signed)
Pt given lab results with instructions to follow low carbohydrate diet/exercise to prevent DM

## 2014-05-07 NOTE — Telephone Encounter (Signed)
-----   Message from Lance Bosch, NP sent at 05/06/2014 11:10 PM EST ----- Please inform patient that her liver enzymes are back to normal, and she tested negative for hepatitis. Please inform patient that she is borderline diabetic once again her hemoglobin A1c has elevated again.  Please inform patient of extensive weight loss and dietary changes that need to take place in order to afford medication management. . If no improvement in 6 months patient will need to begin medication at that time

## 2014-05-29 ENCOUNTER — Ambulatory Visit: Payer: Self-pay

## 2014-09-25 ENCOUNTER — Ambulatory Visit: Payer: Self-pay | Admitting: Internal Medicine

## 2014-10-15 ENCOUNTER — Telehealth: Payer: Self-pay | Admitting: Internal Medicine

## 2014-10-15 ENCOUNTER — Other Ambulatory Visit: Payer: Self-pay | Admitting: Internal Medicine

## 2014-10-15 DIAGNOSIS — F419 Anxiety disorder, unspecified: Secondary | ICD-10-CM

## 2014-10-15 DIAGNOSIS — I10 Essential (primary) hypertension: Secondary | ICD-10-CM

## 2014-10-15 MED ORDER — ESCITALOPRAM OXALATE 20 MG PO TABS: 20.0000 mg | ORAL_TABLET | Freq: Every day | ORAL | Status: DC

## 2014-10-15 MED ORDER — HYDROCHLOROTHIAZIDE 25 MG PO TABS: 25.0000 mg | ORAL_TABLET | Freq: Every day | ORAL | Status: DC

## 2014-10-15 MED ORDER — LISINOPRIL 40 MG PO TABS: 40.0000 mg | ORAL_TABLET | Freq: Every day | ORAL | Status: DC

## 2014-10-15 NOTE — Telephone Encounter (Signed)
Patient's husband presented into facility to request med refills for his wife. Please f/u with pt.

## 2014-10-15 NOTE — Telephone Encounter (Signed)
Will prescribe one month supply on all medications that Ms. Courtney Dixon prescribed at her last visit in November 2015 except for the Ativan.  Patient will need follow up appointment to get further refills of medications and new Rx for Ativan.

## 2014-10-16 ENCOUNTER — Ambulatory Visit: Payer: Self-pay

## 2014-10-16 ENCOUNTER — Ambulatory Visit: Payer: Self-pay | Admitting: Internal Medicine

## 2014-12-17 ENCOUNTER — Telehealth: Payer: Self-pay

## 2014-12-17 NOTE — Telephone Encounter (Signed)
-----   Message from Lance Bosch, NP sent at 12/16/2014 11:13 AM EDT ----- This patient never received her results form 5/5 about her elevated cholesterol. Can you please call.   ----- Message -----    From: SYSTEM    Sent: 12/16/2014  12:04 AM      To: Lance Bosch, NP

## 2014-12-17 NOTE — Telephone Encounter (Signed)
Nurse called home number, person answered, informed nurse of wrong number. Nurse called patient at mobile number, reached voicemail. Left message for patient to call Courtney Dixon with [facility], at [phone].

## 2015-02-10 ENCOUNTER — Telehealth: Payer: Self-pay | Admitting: Internal Medicine

## 2015-02-10 NOTE — Telephone Encounter (Signed)
Pt. Is requesting refill for lisinopril, escitalopram (LEXAPRO) 20 MG tablet [[id number]] ,hydrochlorothiazide (HYDRODIURIL) 25 MG tablet [[id number]] .Marland Kitchen..please send refills to [facility] pharmacy

## 2015-02-14 ENCOUNTER — Telehealth: Payer: Self-pay

## 2015-02-14 DIAGNOSIS — I10 Essential (primary) hypertension: Secondary | ICD-10-CM

## 2015-02-14 MED ORDER — LISINOPRIL 40 MG PO TABS: 40.0000 mg | ORAL_TABLET | Freq: Every day | ORAL | Status: DC

## 2015-02-14 MED ORDER — HYDROCHLOROTHIAZIDE 25 MG PO TABS: 25.0000 mg | ORAL_TABLET | Freq: Every day | ORAL | Status: DC

## 2015-02-14 NOTE — Telephone Encounter (Signed)
Attempted to call patient Patient not available Left message to return call Patient is requesting refills on her medications Per her provider only dispense two weeks Patient needs to schedule an appt.. To be seen

## 2015-02-19 ENCOUNTER — Encounter: Payer: Self-pay | Admitting: Internal Medicine

## 2015-02-19 ENCOUNTER — Ambulatory Visit: Payer: Self-pay | Attending: Internal Medicine | Admitting: Internal Medicine

## 2015-02-19 VITALS — BP 155/103 | HR 60 | Temp 98.0°F | Resp 16 | Ht 64.0 in | Wt 215.0 lb

## 2015-02-19 DIAGNOSIS — E785 Hyperlipidemia, unspecified: Secondary | ICD-10-CM | POA: Insufficient documentation

## 2015-02-19 DIAGNOSIS — I10 Essential (primary) hypertension: Secondary | ICD-10-CM | POA: Insufficient documentation

## 2015-02-19 DIAGNOSIS — F419 Anxiety disorder, unspecified: Secondary | ICD-10-CM | POA: Insufficient documentation

## 2015-02-19 DIAGNOSIS — R030 Elevated blood-pressure reading, without diagnosis of hypertension: Secondary | ICD-10-CM | POA: Insufficient documentation

## 2015-02-19 DIAGNOSIS — IMO0001 Reserved for inherently not codable concepts without codable children: Secondary | ICD-10-CM

## 2015-02-19 DIAGNOSIS — Z720 Tobacco use: Secondary | ICD-10-CM | POA: Insufficient documentation

## 2015-02-19 LAB — BASIC METABOLIC PANEL
BUN: 15 mg/dL (ref 7–25)
CALCIUM: 9.7 mg/dL (ref 8.6–10.2)
CHLORIDE: 103 mmol/L (ref 98–110)
CO2: 27 mmol/L (ref 20–31)
CREATININE: 0.77 mg/dL (ref 0.50–1.10)
Glucose, Bld: 101 mg/dL — ABNORMAL HIGH (ref 65–99)
Potassium: 4.8 mmol/L (ref 3.5–5.3)
Sodium: 141 mmol/L (ref 135–146)

## 2015-02-19 MED ORDER — ESCITALOPRAM OXALATE 20 MG PO TABS: 20.0000 mg | ORAL_TABLET | Freq: Every day | ORAL | Status: DC

## 2015-02-19 MED ORDER — SIMVASTATIN 20 MG PO TABS: 20.0000 mg | ORAL_TABLET | Freq: Every day | ORAL | Status: DC

## 2015-02-19 MED ORDER — HYDROCHLOROTHIAZIDE 25 MG PO TABS: 25.0000 mg | ORAL_TABLET | Freq: Every day | ORAL | Status: DC

## 2015-02-19 MED ORDER — LISINOPRIL 40 MG PO TABS: 40.0000 mg | ORAL_TABLET | Freq: Every day | ORAL | Status: DC

## 2015-02-19 MED ORDER — CLONIDINE HCL 0.1 MG PO TABS
0.1000 mg | ORAL_TABLET | Freq: Once | ORAL | Status: AC
  Administered 2015-02-19: 0.1 mg via ORAL

## 2015-02-19 NOTE — Patient Instructions (Signed)
Next visit please come fasting for that I may check your cholesterol. I will see him in 3 months for follow-up

## 2015-02-19 NOTE — Progress Notes (Signed)
Patient ID: Courtney Dixon, female   DOB: [birth year], 49 y.o.   MRN: [id number] Subjective:  Courtney Dixon is a 49 y.o. female with hypertension, hyperlipidemia, depression here for follow-up. She reports that she has been out of all medications for the past 3 weeks due to missing her appointment and not having money for refills. She states that her blood feels like his rushing therefore she knows her blood pressure is elevated. Patient notes improvement in anxiety while on Lexapro but states that she has been having frequent crying spells since been out of Lexapro.  Current Outpatient Prescriptions  Medication Sig Dispense Refill  . escitalopram (LEXAPRO) 20 MG tablet Take 1 tablet (20 mg total) by mouth daily. 30 tablet 0  . hydrochlorothiazide (HYDRODIURIL) 25 MG tablet Take 1 tablet (25 mg total) by mouth daily. 14 tablet 0  . hydrOXYzine (VISTARIL) 25 MG capsule Take 25 mg by mouth at bedtime as needed for anxiety.    Marland Kitchen ibuprofen (ADVIL,MOTRIN) 200 MG tablet Take 800 mg by mouth every 6 (six) hours as needed. For pain.    Marland Kitchen lisinopril (PRINIVIL,ZESTRIL) 40 MG tablet Take 1 tablet (40 mg total) by mouth daily. 14 tablet 0  . simvastatin (ZOCOR) 20 MG tablet TAKE 1 TABLET BY MOUTH AT BEDTIME. 30 tablet 0  . HYDROcodone-acetaminophen (NORCO/VICODIN) 5-325 MG per tablet Take 1-2 tablets by mouth every 4 (four) hours as needed for moderate pain. (Patient not taking: Reported on 02/19/2015) 20 tablet 0  . LORazepam (ATIVAN) 0.5 MG tablet Take 1 tablet (0.5 mg total) by mouth at bedtime as needed for anxiety. (Patient not taking: Reported on 02/19/2015) 30 tablet 0  . nitrofurantoin, macrocrystal-monohydrate, (MACROBID) 100 MG capsule Take 1 capsule (100 mg total) by mouth 2 (two) times daily. X 7 days (Patient not taking: Reported on 02/19/2015) 14 capsule 0  . penicillin v potassium (VEETID) 500 MG tablet Take 500 mg by mouth daily as needed. For infection    . promethazine (PHENERGAN) 25 MG tablet Take 1  tablet (25 mg total) by mouth every 6 (six) hours as needed for nausea. (Patient not taking: Reported on 02/19/2015) 20 tablet 0   No current facility-administered medications for this visit.    Hypertension ROS: not taking medications regularly as instructed, no medication side effects noted, home BP monitoring in range of 786-754'G systolic over 92'0'F diastolic, no TIA's, no chest pain on exertion, no dyspnea on exertion and no swelling of ankles. Denies myalgia and claudication Objective:  BP 160/117 mmHg  Pulse 73  Temp(Src) 98 F (36.7 C)  Resp 16  Ht 5\' 4"  (1.626 m)  Wt 215 lb (97.523 kg)  BMI 36.89 kg/m2  SpO2 100%  Appearance alert, well appearing, and in no distress, oriented to person, place, and time and overweight. General exam BP noted to be moderately elevated today in office though normal at other visits, S1, S2 normal, no gallop, no murmur, chest clear, no JVD, no HSM, no edema.  Lab review: orders written for new lab studies as appropriate; see orders.   Assessment:   Courtney Dixon was seen today for follow-up.  Diagnoses and all orders for this visit:  Essential hypertension -    Refill hydrochlorothiazide (HYDRODIURIL) 25 MG tablet; Take 1 tablet (25 mg total) by mouth daily. -    Refill lisinopril (PRINIVIL,ZESTRIL) 40 MG tablet; Take 1 tablet (40 mg total) by mouth daily. -     Basic Metabolic Panel Advised patient to call for refills one week prior to  running out. I've also advised patient to speak with pharmacy to see if they would allow her to get her medication today. Weight loss and DASH diet advised.  Elevated blood pressure -     cloNIDine (CATAPRES) tablet 0.1 mg; Take 1 tablet (0.1 mg total) by mouth once in office. Elevation today due to being out of medication 3 weeks  HLD (hyperlipidemia) -     Refill simvastatin (ZOCOR) 20 MG tablet; Take 1 tablet (20 mg total) by mouth at bedtime. I have stressed to patient the need to take simvastatin related to  increased risk for stroke heart disease. Low-fat diet discussed during office visit.  Anxiety -     Refill escitalopram (LEXAPRO) 20 MG tablet; Take 1 tablet (20 mg total) by mouth daily. Stable.    Return in about 3 months (around 05/21/2015) for Hypertension.   Lance Bosch, NP 02/19/2015 12:29 PM

## 2015-02-19 NOTE — Progress Notes (Signed)
Patient here for follow up on her htn Patient has been out of her medications for about three weeks Presents in office with elevated blood pressure Per office protocol 0.1mg  catapress given in office

## 2015-02-25 ENCOUNTER — Telehealth: Payer: Self-pay

## 2015-02-25 NOTE — Telephone Encounter (Signed)
Spoke with patient and she is aware of her lab results And to focus on diet and exercise

## 2015-02-25 NOTE — Telephone Encounter (Signed)
-----   Message from Lance Bosch, NP sent at 02/25/2015 10:19 AM EDT ----- Labs are within normal limits. Will will recheck diabetes level on next visit. Remember she is prediabetic and needs to really focus on weight, exercise, and diet

## 2015-07-25 MED FILL — HYDROCHLOROTHIAZIDE 25 MG T: 25 | 30 days supply | Qty: 30 | Fill #4

## 2015-07-25 MED FILL — LISINOPRIL 40 MG TABLET: 40 | 30 days supply | Qty: 30 | Fill #4

## 2015-07-25 MED FILL — ?ESCITALOPRAM 20 MG TABLET: 20 | 30 days supply | Qty: 30 | Fill #3

## 2015-07-25 MED FILL — SIMVASTATIN 20 MG TABLET: 20 | 30 days supply | Qty: 30 | Fill #1

## 2015-08-28 MED FILL — HYDROCHLOROTHIAZIDE 25 MG T: 25 | 30 days supply | Qty: 30 | Fill #5

## 2015-08-28 MED FILL — LISINOPRIL 40 MG TABLET: 40 | 30 days supply | Qty: 30 | Fill #5

## 2015-08-28 MED FILL — ?ESCITALOPRAM 20 MG TABLET: 20 | 30 days supply | Qty: 30 | Fill #4

## 2015-09-24 ENCOUNTER — Telehealth: Payer: Self-pay | Admitting: Internal Medicine

## 2015-09-24 NOTE — Telephone Encounter (Signed)
Pt. Called requesting a med refill on the following medications:  escitalopram (LEXAPRO) 20 MG tablet  hydrochlorothiazide (HYDRODIURIL) 25 MG tablet lisinopril (PRINIVIL,ZESTRIL) 40 MG tablet simvastatin (ZOCOR) 20 MG tablet  Please f/u

## 2015-09-29 MED FILL — ?ESCITALOPRAM 20 MG TABLET: 20 | 30 days supply | Qty: 30 | Fill #5

## 2015-09-29 MED FILL — SIMVASTATIN 20 MG TABLET: 20 | 30 days supply | Qty: 30 | Fill #2

## 2015-09-29 MED FILL — LISINOPRIL 40 MG TABLET: 40 | 14 days supply | Qty: 14 | Fill #0

## 2015-09-29 MED FILL — ?HYDROCHLOROTHIAZIDE 25 MG: 25 MG | 14 days supply | Qty: 14 | Fill #0

## 2015-10-01 ENCOUNTER — Other Ambulatory Visit: Payer: Self-pay

## 2015-10-01 DIAGNOSIS — F419 Anxiety disorder, unspecified: Secondary | ICD-10-CM

## 2015-10-01 DIAGNOSIS — E785 Hyperlipidemia, unspecified: Secondary | ICD-10-CM

## 2015-10-01 DIAGNOSIS — I10 Essential (primary) hypertension: Secondary | ICD-10-CM

## 2015-10-01 MED ORDER — SIMVASTATIN 20 MG PO TABS
20.0000 mg | ORAL_TABLET | Freq: Every day | ORAL | Status: DC
Start: 2015-10-01 — End: 2015-10-21

## 2015-10-01 MED ORDER — HYDROCHLOROTHIAZIDE 25 MG PO TABS: 25.0000 mg | ORAL_TABLET | Freq: Every day | ORAL | Status: DC

## 2015-10-01 MED ORDER — LISINOPRIL 40 MG PO TABS: 40.0000 mg | ORAL_TABLET | Freq: Every day | ORAL | Status: DC

## 2015-10-01 MED ORDER — ESCITALOPRAM OXALATE 20 MG PO TABS: 20.0000 mg | ORAL_TABLET | Freq: Every day | ORAL | Status: DC

## 2015-10-01 NOTE — Telephone Encounter (Signed)
Spoke with patient, patient verified name and DOB. Patient was notified that her med request will be refilled for a month. Patient stated that she called the front desk and they told her to call back the 1st of May. Patient verbalize understanding that no other additional refills will be available until she sees her provider.

## 2015-10-21 ENCOUNTER — Encounter: Payer: Self-pay | Admitting: Family Medicine

## 2015-10-21 ENCOUNTER — Ambulatory Visit: Payer: Self-pay | Attending: Family Medicine | Admitting: Family Medicine

## 2015-10-21 VITALS — BP 141/93 | HR 79 | Temp 99.3°F | Resp 16 | Ht 64.0 in | Wt 218.0 lb

## 2015-10-21 DIAGNOSIS — R52 Pain, unspecified: Secondary | ICD-10-CM

## 2015-10-21 DIAGNOSIS — G8929 Other chronic pain: Secondary | ICD-10-CM | POA: Insufficient documentation

## 2015-10-21 DIAGNOSIS — M545 Low back pain, unspecified: Secondary | ICD-10-CM | POA: Insufficient documentation

## 2015-10-21 DIAGNOSIS — E785 Hyperlipidemia, unspecified: Secondary | ICD-10-CM | POA: Insufficient documentation

## 2015-10-21 DIAGNOSIS — E1165 Type 2 diabetes mellitus with hyperglycemia: Secondary | ICD-10-CM

## 2015-10-21 DIAGNOSIS — Z79899 Other long term (current) drug therapy: Secondary | ICD-10-CM | POA: Insufficient documentation

## 2015-10-21 DIAGNOSIS — I10 Essential (primary) hypertension: Secondary | ICD-10-CM | POA: Insufficient documentation

## 2015-10-21 DIAGNOSIS — E119 Type 2 diabetes mellitus without complications: Secondary | ICD-10-CM | POA: Insufficient documentation

## 2015-10-21 DIAGNOSIS — IMO0002 Reserved for concepts with insufficient information to code with codable children: Secondary | ICD-10-CM | POA: Insufficient documentation

## 2015-10-21 DIAGNOSIS — E1142 Type 2 diabetes mellitus with diabetic polyneuropathy: Secondary | ICD-10-CM | POA: Insufficient documentation

## 2015-10-21 DIAGNOSIS — F329 Major depressive disorder, single episode, unspecified: Secondary | ICD-10-CM | POA: Insufficient documentation

## 2015-10-21 DIAGNOSIS — E669 Obesity, unspecified: Secondary | ICD-10-CM | POA: Insufficient documentation

## 2015-10-21 DIAGNOSIS — F17200 Nicotine dependence, unspecified, uncomplicated: Secondary | ICD-10-CM | POA: Insufficient documentation

## 2015-10-21 DIAGNOSIS — F419 Anxiety disorder, unspecified: Secondary | ICD-10-CM | POA: Insufficient documentation

## 2015-10-21 DIAGNOSIS — R6889 Other general symptoms and signs: Secondary | ICD-10-CM | POA: Insufficient documentation

## 2015-10-21 DIAGNOSIS — Z114 Encounter for screening for human immunodeficiency virus [HIV]: Secondary | ICD-10-CM | POA: Insufficient documentation

## 2015-10-21 DIAGNOSIS — Z6837 Body mass index (BMI) 37.0-37.9, adult: Secondary | ICD-10-CM | POA: Insufficient documentation

## 2015-10-21 DIAGNOSIS — F418 Other specified anxiety disorders: Secondary | ICD-10-CM

## 2015-10-21 LAB — BASIC METABOLIC PANEL WITH GFR
BUN: 15 mg/dL (ref 7–25)
CO2: 28 mmol/L (ref 20–31)
Calcium: 9.8 mg/dL (ref 8.6–10.2)
Chloride: 98 mmol/L (ref 98–110)
Creat: 0.64 mg/dL (ref 0.50–1.10)
GLUCOSE: 91 mg/dL (ref 65–99)
POTASSIUM: 4.4 mmol/L (ref 3.5–5.3)
Sodium: 137 mmol/L (ref 135–146)

## 2015-10-21 LAB — LIPID PANEL
CHOLESTEROL: 231 mg/dL — AB (ref 125–200)
HDL: 49 mg/dL (ref >=46)
LDL Cholesterol: 124 mg/dL (ref <130)
Total CHOL/HDL Ratio: 4.7 Ratio (ref <=5.0)
Triglycerides: 289 mg/dL — ABNORMAL HIGH (ref <150)
VLDL: 58 mg/dL — AB (ref <30)

## 2015-10-21 LAB — TSH: TSH: 1.87 m[IU]/L

## 2015-10-21 LAB — POCT GLYCOSYLATED HEMOGLOBIN (HGB A1C): Hemoglobin A1C: 6.8

## 2015-10-21 MED ORDER — SIMVASTATIN 20 MG PO TABS: 20.0000 mg | ORAL_TABLET | Freq: Every day | ORAL | Status: DC

## 2015-10-21 MED ORDER — HYDROCHLOROTHIAZIDE 25 MG PO TABS: 25.0000 mg | ORAL_TABLET | Freq: Every day | ORAL | Status: DC

## 2015-10-21 MED ORDER — VENLAFAXINE HCL ER 75 MG PO CP24: 75.0000 mg | ORAL_CAPSULE | Freq: Every day | ORAL | Status: DC

## 2015-10-21 MED ORDER — METFORMIN HCL ER 500 MG PO TB24: 1000.0000 mg | ORAL_TABLET | Freq: Every day | ORAL | Status: DC

## 2015-10-21 MED ORDER — LORAZEPAM 0.5 MG PO TABS: 0.5000 mg | ORAL_TABLET | Freq: Every evening | ORAL | Status: DC | PRN

## 2015-10-21 MED ORDER — LISINOPRIL 40 MG PO TABS: 40.0000 mg | ORAL_TABLET | Freq: Every day | ORAL | Status: DC

## 2015-10-21 MED FILL — METFORMIN HCL ER 500 MG TAB: 500 | 30 days supply | Qty: 60 | Fill #0

## 2015-10-21 MED FILL — ?HYDROCHLOROTHIAZIDE 25 MG: 25 MG | 30 days supply | Qty: 30 | Fill #0

## 2015-10-21 MED FILL — LISINOPRIL 40 MG TABLET: 40 | 30 days supply | Qty: 30 | Fill #0

## 2015-10-21 MED FILL — VENLAFAXINE HCL ER 75 MG CA: 75 | 30 days supply | Qty: 30 | Fill #0

## 2015-10-21 NOTE — Assessment & Plan Note (Signed)
Check lipids Simvastatin refilled

## 2015-10-21 NOTE — Patient Instructions (Addendum)
Courtney Dixon was seen today for establish care and medication refill.  Diagnoses and all orders for this visit:  Essential hypertension -     lisinopril (PRINIVIL,ZESTRIL) 40 MG tablet; Take 1 tablet (40 mg total) by mouth daily. -     hydrochlorothiazide (HYDRODIURIL) 25 MG tablet; Take 1 tablet (25 mg total) by mouth daily. -     BASIC METABOLIC PANEL WITH GFR  Obesity (BMI 30-39.9)  Prediabetes -     HgB A1c  Anxiety and depression -     venlafaxine XR (EFFEXOR XR) 75 MG 24 hr capsule; Take 1 capsule (75 mg total) by mouth daily with breakfast. -     LORazepam (ATIVAN) 0.5 MG tablet; Take 1 tablet (0.5 mg total) by mouth at bedtime as needed for anxiety.  Sensation of feeling hot -     TSH  Screening for HIV (human immunodeficiency virus) -     HIV antibody (with reflex)  Hyperlipidemia -     Lipid Panel -     simvastatin (ZOCOR) 20 MG tablet; Take 1 tablet (20 mg total) by mouth at bedtime.   F/u in 4 weeks for pap smear and anxiety   Dr. Adrian Blackwater

## 2015-10-21 NOTE — Assessment & Plan Note (Signed)
A: anxiety and depression with heat intolerance P: Stop lexapro 20 mg Transition to Effexor 75 mg XR daily

## 2015-10-21 NOTE — Assessment & Plan Note (Signed)
A; elevated BP Med: compliant P: Stop NSAID Smoking cessation Work on weight loss Treat anxiety Will need to add 3rd BP medicine if BP still elevated after above

## 2015-10-21 NOTE — Assessment & Plan Note (Addendum)
Repeat A1c done today  Patient with diabetes range A1c  Plan Low carb diet Increase exercise Start metformin 500 mg XR for one week then 1000 mg nightly

## 2015-10-21 NOTE — Progress Notes (Signed)
Subjective:  Patient ID: Courtney Dixon, female    DOB: 1965-11-26  Age: 50 y.o. MRN: [id number]  CC: Establish Care and Medication Refill   HPI Courtney Dixon presents for   1. CHRONIC HYPERTENSION  Disease Monitoring  Blood pressure range: not checking   Chest pain: no   Dyspnea: no   Claudication: no   Medication compliance: yes  Medication Side Effects  Lightheadedness: no   Urinary frequency: no   Edema: no    2. Anxiety and depression: this is longstanding. She is taking lexapro 20 mg daily. She has tried multiple SSRIs in the past she states that overtime the medications stop working. She endorses depressed mood, anxious mood, feeling hot, feeling fidgety and poor sleep that is no longer responsive to benadryl.   3. HLD: intermittently takes statin. Forgets to take it most evenings.  4. Chronic low back pain: has mid back pain and L low back pain that radiates down to her L lateral thigh. This  occurs most days. Pain is exacerbated by prolonged standing or heavy lifting. She has had episodes of severe muscle spasms. She takes ibuprofen most days of the weeks 2-3 doses a day and this helps with her pain.    Social History  Substance Use Topics  . Smoking status: Current Every Day Smoker    Types: Cigarettes  . Smokeless tobacco: Not on file  . Alcohol Use: Yes   Outpatient Prescriptions Prior to Visit  Medication Sig Dispense Refill  . escitalopram (LEXAPRO) 20 MG tablet Take 1 tablet (20 mg total) by mouth daily. 30 tablet 0  . hydrochlorothiazide (HYDRODIURIL) 25 MG tablet Take 1 tablet (25 mg total) by mouth daily. 30 tablet 0  . ibuprofen (ADVIL,MOTRIN) 200 MG tablet Take 800 mg by mouth every 6 (six) hours as needed. For pain.    Marland Kitchen lisinopril (PRINIVIL,ZESTRIL) 40 MG tablet Take 1 tablet (40 mg total) by mouth daily. 30 tablet 0  . simvastatin (ZOCOR) 20 MG tablet Take 1 tablet (20 mg total) by mouth at bedtime. 30 tablet 0  . HYDROcodone-acetaminophen  (NORCO/VICODIN) 5-325 MG per tablet Take 1-2 tablets by mouth every 4 (four) hours as needed for moderate pain. (Patient not taking: Reported on 02/19/2015) 20 tablet 0  . hydrOXYzine (VISTARIL) 25 MG capsule Take 25 mg by mouth at bedtime as needed for anxiety.    Marland Kitchen LORazepam (ATIVAN) 0.5 MG tablet Take 1 tablet (0.5 mg total) by mouth at bedtime as needed for anxiety. (Patient not taking: Reported on 02/19/2015) 30 tablet 0  . nitrofurantoin, macrocrystal-monohydrate, (MACROBID) 100 MG capsule Take 1 capsule (100 mg total) by mouth 2 (two) times daily. X 7 days (Patient not taking: Reported on 02/19/2015) 14 capsule 0  . penicillin v potassium (VEETID) 500 MG tablet Take 500 mg by mouth daily as needed. For infection    . promethazine (PHENERGAN) 25 MG tablet Take 1 tablet (25 mg total) by mouth every 6 (six) hours as needed for nausea. (Patient not taking: Reported on 02/19/2015) 20 tablet 0   No facility-administered medications prior to visit.    ROS Review of Systems  Constitutional: Positive for appetite change (feeling hungry after meals ). Negative for fever and chills.  Eyes: Negative for visual disturbance.  Respiratory: Negative for shortness of breath.   Cardiovascular: Negative for chest pain.  Gastrointestinal: Negative for abdominal pain and blood in stool.  Endocrine: Positive for heat intolerance.  Musculoskeletal: Positive for back pain. Negative for arthralgias.  Skin:  Negative for rash.  Allergic/Immunologic: Negative for immunocompromised state.  Hematological: Negative for adenopathy. Does not bruise/bleed easily.  Psychiatric/Behavioral: Positive for sleep disturbance and dysphoric mood. Negative for suicidal ideas. The patient is nervous/anxious.     Objective:  BP 141/93 mmHg  Pulse 79  Temp(Src) 99.3 F (37.4 C) (Oral)  Resp 16  Ht 5\' 4"  (1.626 m)  Wt 218 lb (98.884 kg)  BMI 37.40 kg/m2  SpO2 96%  BP/Weight 10/21/2015 02/19/2015 [id number]  Systolic BP Q000111Q [id number]    Diastolic BP 93 [id number] 88  Wt. (Lbs) 218 215 216  BMI 37.4 36.89 37.06    Physical Exam  Constitutional: She is oriented to person, place, and time. She appears well-developed and well-nourished. No distress.  Obese   HENT:  Head: Normocephalic and atraumatic.  Neck: No thyromegaly present.  Cardiovascular: Normal rate, regular rhythm, normal heart sounds and intact distal pulses.   Pulmonary/Chest: Effort normal and breath sounds normal.  Musculoskeletal: She exhibits no edema.  Neurological: She is alert and oriented to person, place, and time.  Skin: Skin is warm and dry. No rash noted.  Psychiatric: She has a normal mood and affect.   Lab Results  Component Value Date   HGBA1C 6.4* 05/03/2014   Lab Results  Component Value Date   HGBA1C 6.8 10/21/2015    Assessment & Plan:   There are no diagnoses linked to this encounter. Courtney Dixon was seen today for establish care and medication refill.  Diagnoses and all orders for this visit:  Essential hypertension -     lisinopril (PRINIVIL,ZESTRIL) 40 MG tablet; Take 1 tablet (40 mg total) by mouth daily. -     hydrochlorothiazide (HYDRODIURIL) 25 MG tablet; Take 1 tablet (25 mg total) by mouth daily. -     BASIC METABOLIC PANEL WITH GFR  Obesity (BMI 30-39.9)  Anxiety and depression -     venlafaxine XR (EFFEXOR XR) 75 MG 24 hr capsule; Take 1 capsule (75 mg total) by mouth daily with breakfast. -     LORazepam (ATIVAN) 0.5 MG tablet; Take 1 tablet (0.5 mg total) by mouth at bedtime as needed for anxiety.  Sensation of feeling hot -     TSH  Screening for HIV (human immunodeficiency virus) -     HIV antibody (with reflex)  Hyperlipidemia -     Lipid Panel -     simvastatin (ZOCOR) 20 MG tablet; Take 1 tablet (20 mg total) by mouth at bedtime.  New onset type 2 diabetes mellitus (HCC) -     HgB A1c -     metFORMIN (GLUCOPHAGE XR) 500 MG 24 hr tablet; Take 2 tablets (1,000 mg total) by mouth daily after supper. Take 500  mg for first week, then 1000 mg   Meds ordered this encounter  Medications  . venlafaxine XR (EFFEXOR XR) 75 MG 24 hr capsule    Sig: Take 1 capsule (75 mg total) by mouth daily with breakfast.    Dispense:  30 capsule    Refill:  5  . lisinopril (PRINIVIL,ZESTRIL) 40 MG tablet    Sig: Take 1 tablet (40 mg total) by mouth daily.    Dispense:  30 tablet    Refill:  5  . hydrochlorothiazide (HYDRODIURIL) 25 MG tablet    Sig: Take 1 tablet (25 mg total) by mouth daily.    Dispense:  30 tablet    Refill:  5  . simvastatin (ZOCOR) 20 MG tablet    Sig: Take  1 tablet (20 mg total) by mouth at bedtime.    Dispense:  30 tablet    Refill:  5  . LORazepam (ATIVAN) 0.5 MG tablet    Sig: Take 1 tablet (0.5 mg total) by mouth at bedtime as needed for anxiety.    Dispense:  30 tablet    Refill:  0    Follow-up: No Follow-up on file.   Boykin Nearing MD

## 2015-10-21 NOTE — Progress Notes (Signed)
Medicine refills and questions Stated lexapro not helping  No pain today  Tobacco user 15 cigarette per day  No suicidal thoughts in the past two weeks

## 2015-10-22 LAB — HIV ANTIBODY (ROUTINE TESTING W REFLEX): HIV 1&2 Ab, 4th Generation: NONREACTIVE

## 2015-10-22 MED ORDER — ATORVASTATIN CALCIUM 40 MG PO TABS: 40.0000 mg | ORAL_TABLET | Freq: Every day | ORAL | Status: DC

## 2015-10-22 MED FILL — ATORVASTATIN 40 MG TABLET: 40 | 90 days supply | Qty: 90 | Fill #0

## 2015-10-22 NOTE — Addendum Note (Signed)
Addended by: Boykin Nearing on: 10/22/2015 11:04 AM   Modules accepted: Orders

## 2015-10-23 ENCOUNTER — Other Ambulatory Visit: Payer: Self-pay | Admitting: Pharmacist

## 2015-11-19 MED FILL — VENLAFAXINE HCL ER 75 MG CA: 75 | 30 days supply | Qty: 30 | Fill #1

## 2015-11-19 MED FILL — HYDROCHLOROTHIAZIDE 25 MG T: 25 | 30 days supply | Qty: 30 | Fill #1

## 2015-11-19 MED FILL — LISINOPRIL 40 MG TABLET: 40 | 30 days supply | Qty: 30 | Fill #1

## 2015-11-19 MED FILL — METFORMIN HCL ER 500 MG TAB: 500 | 30 days supply | Qty: 60 | Fill #1

## 2015-11-28 ENCOUNTER — Ambulatory Visit: Payer: Self-pay | Attending: Family Medicine | Admitting: Family Medicine

## 2015-11-28 ENCOUNTER — Encounter: Payer: Self-pay | Admitting: Family Medicine

## 2015-11-28 VITALS — BP 135/91 | HR 91 | Temp 98.5°F | Resp 16 | Ht 64.0 in | Wt 220.0 lb

## 2015-11-28 DIAGNOSIS — E119 Type 2 diabetes mellitus without complications: Secondary | ICD-10-CM | POA: Insufficient documentation

## 2015-11-28 DIAGNOSIS — F17200 Nicotine dependence, unspecified, uncomplicated: Secondary | ICD-10-CM | POA: Insufficient documentation

## 2015-11-28 DIAGNOSIS — F418 Other specified anxiety disorders: Secondary | ICD-10-CM

## 2015-11-28 DIAGNOSIS — Z79899 Other long term (current) drug therapy: Secondary | ICD-10-CM | POA: Insufficient documentation

## 2015-11-28 DIAGNOSIS — F329 Major depressive disorder, single episode, unspecified: Secondary | ICD-10-CM | POA: Insufficient documentation

## 2015-11-28 DIAGNOSIS — F419 Anxiety disorder, unspecified: Secondary | ICD-10-CM | POA: Insufficient documentation

## 2015-11-28 DIAGNOSIS — Z124 Encounter for screening for malignant neoplasm of cervix: Secondary | ICD-10-CM | POA: Insufficient documentation

## 2015-11-28 DIAGNOSIS — Z7984 Long term (current) use of oral hypoglycemic drugs: Secondary | ICD-10-CM | POA: Insufficient documentation

## 2015-11-28 MED ORDER — VENLAFAXINE HCL ER 150 MG PO CP24: 150.0000 mg | ORAL_CAPSULE | Freq: Every day | ORAL | Status: DC

## 2015-11-28 NOTE — Progress Notes (Signed)
Gyn exam, last pap smear about 10 year with normal results  Sexually active, no pain with intercourse  No vaginal discharge no odor  No pain today  Tobacco user 1/2 ppday  No suicidal thoughts in the past two weeks

## 2015-11-28 NOTE — Patient Instructions (Addendum)
Courtney Dixon was seen today for gynecologic exam.  Diagnoses and all orders for this visit:  Pap smear for cervical cancer screening -     Cytology - PAP  New onset type 2 diabetes mellitus ([place]) -     POCT glucose (manual entry)  Anxiety and depression -     venlafaxine XR (EFFEXOR-XR) 150 MG 24 hr capsule; Take 1 capsule (150 mg total) by mouth daily with breakfast.   F/u in 4 weeks for anxiety and depression with plan to start buspar is anxiety symptoms are still high  Dr. Adrian Blackwater   Type 2 Diabetes Mellitus, Adult Type 2 diabetes mellitus, often simply referred to as type 2 diabetes, is a long-lasting (chronic) disease. In type 2 diabetes, the pancreas does not make enough insulin (a hormone), the cells are less responsive to the insulin that is made (insulin resistance), or both. Normally, insulin moves sugars from food into the tissue cells. The tissue cells use the sugars for energy. The lack of insulin or the lack of normal response to insulin causes excess sugars to build up in the blood instead of going into the tissue cells. As a result, high blood sugar (hyperglycemia) develops. The effect of high sugar (glucose) levels can cause many complications. Type 2 diabetes was also previously called adult-onset diabetes, but it can occur at any age.  RISK FACTORS  A person is predisposed to developing type 2 diabetes if someone in the family has the disease and also has one or more of the following primary risk factors:  Weight gain, or being overweight or obese.  An inactive lifestyle.  A history of consistently eating high-calorie foods. Maintaining a normal weight and regular physical activity can reduce the chance of developing type 2 diabetes. SYMPTOMS  A person with type 2 diabetes may not show symptoms initially. The symptoms of type 2 diabetes appear slowly. The symptoms include:  Increased thirst (polydipsia).  Increased urination (polyuria).  Increased urination during  the night (nocturia).  Sudden or unexplained weight changes.  Frequent, recurring infections.  Tiredness (fatigue).  Weakness.  Vision changes, such as blurred vision.  Fruity smell to your breath.  Abdominal pain.  Nausea or vomiting.  Cuts or bruises which are slow to heal.  Tingling or numbness in the hands or feet.  An open skin wound (ulcer). DIAGNOSIS Type 2 diabetes is frequently not diagnosed until complications of diabetes are present. Type 2 diabetes is diagnosed when symptoms or complications are present and when blood glucose levels are increased. Your blood glucose level may be checked by one or more of the following blood tests:  A fasting blood glucose test. You will not be allowed to eat for at least 8 hours before a blood sample is taken.  A random blood glucose test. Your blood glucose is checked at any time of the day regardless of when you ate.  A hemoglobin A1c blood glucose test. A hemoglobin A1c test provides information about blood glucose control over the previous 3 months.  An oral glucose tolerance test (OGTT). Your blood glucose is measured after you have not eaten (fasted) for 2 hours and then after you drink a glucose-containing beverage. TREATMENT   You may need to take insulin or diabetes medicine daily to keep blood glucose levels in the desired range.  If you use insulin, you may need to adjust the dosage depending on the carbohydrates that you eat with each meal or snack.  Lifestyle changes are recommended as part of  your treatment. These may include:  Following an individualized diet plan developed by a nutritionist or dietitian.  Exercising daily. Your health care providers will set individualized treatment goals for you based on your age, your medicines, how long you have had diabetes, and any other medical conditions you have. Generally, the goal of treatment is to maintain the following blood glucose levels:  Before meals  (preprandial): 80-130 mg/dL.  After meals (postprandial): below 180 mg/dL.  A1c: less than 6.5-7%. HOME CARE INSTRUCTIONS   Have your hemoglobin A1c level checked twice a year.  Perform daily blood glucose monitoring as directed by your health care provider.  Monitor urine ketones when you are ill and as directed by your health care provider.  Take your diabetes medicine or insulin as directed by your health care provider to maintain your blood glucose levels in the desired range.  Never run out of diabetes medicine or insulin. It is needed every day.  If you are using insulin, you may need to adjust the amount of insulin given based on your intake of carbohydrates. Carbohydrates can raise blood glucose levels but need to be included in your diet. Carbohydrates provide vitamins, minerals, and fiber which are an essential part of a healthy diet. Carbohydrates are found in fruits, vegetables, whole grains, dairy products, legumes, and foods containing added sugars.  Eat healthy foods. You should make an appointment to see a registered dietitian to help you create an eating plan that is right for you.  Lose weight if you are overweight.  Carry a medical alert card or wear your medical alert jewelry.  Carry a 15-gram carbohydrate snack with you at all times to treat low blood glucose (hypoglycemia). Some examples of 15-gram carbohydrate snacks include:  Glucose tablets, 3 or 4.  Glucose gel, 15-gram tube.  Raisins, 2 tablespoons (24 grams).  Jelly beans, 6.  Animal crackers, 8.  Regular pop, 4 ounces (120 mL).  Gummy treats, 9.  Recognize hypoglycemia. Hypoglycemia occurs with blood glucose levels of 70 mg/dL and below. The risk for hypoglycemia increases when fasting or skipping meals, during or after intense exercise, and during sleep. Hypoglycemia symptoms can include:  Tremors or shakes.  Decreased ability to concentrate.  Sweating.  Increased heart  rate.  Headache.  Dry mouth.  Hunger.  Irritability.  Anxiety.  Restless sleep.  Altered speech or coordination.  Confusion.  Treat hypoglycemia promptly. If you are alert and able to safely swallow, follow the 15:15 rule:  Take 15-20 grams of rapid-acting glucose or carbohydrate. Rapid-acting options include glucose gel, glucose tablets, or 4 ounces (120 mL) of fruit juice, regular soda, or low-fat milk.  Check your blood glucose level 15 minutes after taking the glucose.  Take 15-20 grams more of glucose if the repeat blood glucose level is still 70 mg/dL or below.  Eat a meal or snack within 1 hour once blood glucose levels return to normal.  Be alert to feeling very thirsty and urinating more frequently than usual, which are early signs of hyperglycemia. An early awareness of hyperglycemia allows for prompt treatment. Treat hyperglycemia as directed by your health care provider.  Engage in at least 150 minutes of moderate-intensity physical activity a week, spread over at least 3 days of the week or as directed by your health care provider. In addition, you should engage in resistance exercise at least 2 times a week or as directed by your health care provider. Try to spend no more than 90 minutes at one  time inactive.  Adjust your medicine and food intake as needed if you start a new exercise or sport.  Follow your sick-day plan anytime you are unable to eat or drink as usual.  Do not use any tobacco products including cigarettes, chewing tobacco, or electronic cigarettes. If you need help quitting, ask your health care provider.  Limit alcohol intake to no more than 1 drink per day for nonpregnant women and 2 drinks per day for men. You should drink alcohol only when you are also eating food. Talk with your health care provider whether alcohol is safe for you. Tell your health care provider if you drink alcohol several times a week.  Keep all follow-up visits as directed  by your health care provider. This is important.  Schedule an eye exam soon after the diagnosis of type 2 diabetes and then annually.  Perform daily skin and foot care. Examine your skin and feet daily for cuts, bruises, redness, nail problems, bleeding, blisters, or sores. A foot exam by a health care provider should be done annually.  Brush your teeth and gums at least twice a day and floss at least once a day. Follow up with your dentist regularly.  Share your diabetes management plan with your workplace or school.  Keep your immunizations up to date. It is recommended that you receive a flu (influenza) vaccine every year. It is also recommended that you receive a pneumonia (pneumococcal) vaccine. If you are 39 years of age or older and have never received a pneumonia vaccine, this vaccine may be given as a series of two separate shots. Ask your health care provider which additional vaccines may be recommended.  Learn to manage stress.  Obtain ongoing diabetes education and support as needed.  Participate in or seek rehabilitation as needed to maintain or improve independence and quality of life. Request a physical or occupational therapy referral if you are having foot or hand numbness, or difficulties with grooming, dressing, eating, or physical activity. SEEK MEDICAL CARE IF:   You are unable to eat food or drink fluids for more than 6 hours.  You have nausea and vomiting for more than 6 hours.  Your blood glucose level is over 240 mg/dL.  There is a change in mental status.  You develop an additional serious illness.  You have diarrhea for more than 6 hours.  You have been sick or have had a fever for a couple of days and are not getting better.  You have pain during any physical activity.  SEEK IMMEDIATE MEDICAL CARE IF:  You have difficulty breathing.  You have moderate to large ketone levels.   This information is not intended to replace advice given to you by your  health care provider. Make sure you discuss any questions you have with your health care provider.   Document Released: 05/31/2005 Document Revised: 02/19/2015 Document Reviewed: 12/28/2011 Elsevier Interactive Patient Education [employer].

## 2015-11-28 NOTE — Progress Notes (Signed)
Subjective:  Patient ID: Courtney Dixon, female    DOB: 11-27-1965  Age: 50 y.o. MRN: [id number]  CC: Gynecologic Exam   HPI Danene Santamarina presents for    1. Pap: has hx of cervical dysplasia over a decade ago. Last pap 10 years ago. LMP in late 68s. No discharge. No pain with sex. No vaginal bleeding.  2. Newly diagnosed diabetes: obese, smoker. Taking metformin 1000 mg qHS. Experiencing some constipation with metformin. No diarrhea. No weight loss. She is reducing the carbs in her diet.   3. Anxiety: persistent. She is less sweaty with Effexor. Depression has improved. No SI,   Social History  Substance Use Topics  . Smoking status: Current Every Day Smoker    Types: Cigarettes  . Smokeless tobacco: Not on file  . Alcohol Use: Yes    Outpatient Prescriptions Prior to Visit  Medication Sig Dispense Refill  . atorvastatin (LIPITOR) 40 MG tablet Take 1 tablet (40 mg total) by mouth daily. 90 tablet 3  . hydrochlorothiazide (HYDRODIURIL) 25 MG tablet Take 1 tablet (25 mg total) by mouth daily. 30 tablet 5  . ibuprofen (ADVIL,MOTRIN) 200 MG tablet Take 800 mg by mouth every 6 (six) hours as needed. For pain.    Marland Kitchen lisinopril (PRINIVIL,ZESTRIL) 40 MG tablet Take 1 tablet (40 mg total) by mouth daily. 30 tablet 5  . metFORMIN (GLUCOPHAGE XR) 500 MG 24 hr tablet Take 2 tablets (1,000 mg total) by mouth daily after supper. Take 500 mg for first week, then 1000 mg 60 tablet 2  . venlafaxine XR (EFFEXOR XR) 75 MG 24 hr capsule Take 1 capsule (75 mg total) by mouth daily with breakfast. 30 capsule 5  . LORazepam (ATIVAN) 0.5 MG tablet Take 1 tablet (0.5 mg total) by mouth at bedtime as needed for anxiety. (Patient not taking: Reported on 11/28/2015) 30 tablet 0   No facility-administered medications prior to visit.    ROS Review of Systems  Constitutional: Negative for fever and chills.  Eyes: Negative for visual disturbance.  Respiratory: Negative for shortness of breath.     Cardiovascular: Negative for chest pain.  Gastrointestinal: Negative for abdominal pain and blood in stool.  Genitourinary: Negative for vaginal bleeding, vaginal discharge, genital sores, vaginal pain and dyspareunia.  Musculoskeletal: Negative for back pain and arthralgias.  Skin: Negative for rash.  Allergic/Immunologic: Negative for immunocompromised state.  Hematological: Negative for adenopathy. Does not bruise/bleed easily.  Psychiatric/Behavioral: Negative for suicidal ideas and dysphoric mood.    Objective:  BP 135/91 mmHg  Pulse 91  Temp(Src) 98.5 F (36.9 C) (Oral)  Resp 16  Ht 5\' 4"  (1.626 m)  Wt 220 lb (99.791 kg)  BMI 37.74 kg/m2  SpO2 97%  BP/Weight 11/28/2015 [id number]  Systolic BP [id number]  Diastolic BP 91 93 [id number]  Wt. (Lbs) 220 218 215  BMI 37.74 37.4 36.89     Physical Exam  Constitutional: She appears well-developed and well-nourished. No distress.  Cardiovascular: Normal rate, regular rhythm, normal heart sounds and intact distal pulses.   Pulmonary/Chest: Effort normal and breath sounds normal.  Genitourinary: Vagina normal and uterus normal. Pelvic exam was performed with patient prone. There is no rash, tenderness or lesion on the right labia. There is no rash, tenderness or lesion on the left labia. Cervix exhibits no motion tenderness, no discharge and no friability.  Musculoskeletal: She exhibits no edema.  Lymphadenopathy:       Right: No inguinal adenopathy present.  Left: No inguinal adenopathy present.  Skin: Skin is warm and dry. No rash noted.   Lab Results  Component Value Date   HGBA1C 6.8 10/21/2015   CBG 105  Depression screen [facility] 2/9 11/28/2015 10/21/2015 10/15/2013  Decreased Interest 2 1 3   Down, Depressed, Hopeless 1 2 3   PHQ - 2 Score 3 3 6   Altered sleeping 3 2 2   Tired, decreased energy 1 1 1   Change in appetite 1 3 3   Feeling bad or failure about yourself  1 2 3   Trouble concentrating 2 1 3   Moving slowly  or fidgety/restless 0 2 3  Suicidal thoughts 0 0 0  PHQ-9 Score 11 14 21    GAD 7 : Generalized Anxiety Score 11/28/2015 10/21/2015  Nervous, Anxious, on Edge 2 1  Control/stop worrying 2 2  Worry too much - different things 2 2  Trouble relaxing 2 2  Restless 2 2  Easily annoyed or irritable 2 3  Afraid - awful might happen 1 1  Total GAD 7 Score 13 13     Assessment & Plan:   Annicka was seen today for gynecologic exam.  Diagnoses and all orders for this visit:  Pap smear for cervical cancer screening -     Cytology - PAP  New onset type 2 diabetes mellitus ([place]) -     POCT glucose (manual entry)  Anxiety and depression -     venlafaxine XR (EFFEXOR-XR) 150 MG 24 hr capsule; Take 1 capsule (150 mg total) by mouth daily with breakfast.    No orders of the defined types were placed in this encounter.    Follow-up: Return in about 4 weeks (around 12/26/2015) for anxiety .   Boykin Nearing MD

## 2015-12-01 LAB — CERVICOVAGINAL ANCILLARY ONLY
CHLAMYDIA, DNA PROBE: NEGATIVE
NEISSERIA GONORRHEA: NEGATIVE
Wet Prep (BD Affirm): POSITIVE — AB

## 2015-12-01 LAB — CYTOLOGY - PAP

## 2015-12-01 NOTE — Assessment & Plan Note (Signed)
Continue metformin We discussed the importance of exercise, weight loss and low carb diet in controlling type 2 diabets

## 2015-12-01 NOTE — Assessment & Plan Note (Addendum)
Persistent Increase effexor dose to 150 mg XR daily  Plan to add buspar if anxiety symptoms are still so high

## 2015-12-03 ENCOUNTER — Ambulatory Visit: Payer: Self-pay

## 2015-12-17 MED FILL — VENLAFAXINE HCL ER 150 MG C: 150 | 30 days supply | Qty: 30 | Fill #0

## 2015-12-23 MED FILL — LISINOPRIL 40 MG TABLET: 40 | 30 days supply | Qty: 30 | Fill #2

## 2015-12-23 MED FILL — METFORMIN HCL ER 500 MG TAB: 500 | 30 days supply | Qty: 60 | Fill #2

## 2015-12-23 MED FILL — HYDROCHLOROTHIAZIDE 25 MG T: 25 | 30 days supply | Qty: 30 | Fill #2

## 2016-01-15 MED FILL — VENLAFAXINE HCL ER 150 MG C: 150 | 30 days supply | Qty: 30 | Fill #1

## 2016-01-22 ENCOUNTER — Other Ambulatory Visit: Payer: Self-pay | Admitting: Family Medicine

## 2016-01-22 DIAGNOSIS — E119 Type 2 diabetes mellitus without complications: Secondary | ICD-10-CM

## 2016-01-22 MED FILL — HYDROCHLOROTHIAZIDE 25 MG T: 25 | 30 days supply | Qty: 30 | Fill #3

## 2016-01-22 MED FILL — ?METFORMIN HCL 500MG TABLET: 500 | 30 days supply | Qty: 60 | Fill #0

## 2016-01-22 MED FILL — LISINOPRIL 40 MG TABLET: 40 | 30 days supply | Qty: 30 | Fill #3

## 2016-02-23 MED FILL — LISINOPRIL 40 MG TABLET: 40 | 30 days supply | Qty: 30 | Fill #4

## 2016-02-23 MED FILL — VENLAFAXINE HCL ER 150 MG C: 150 | 30 days supply | Qty: 30 | Fill #2

## 2016-02-23 MED FILL — HYDROCHLOROTHIAZIDE 25 MG T: 25 | 30 days supply | Qty: 30 | Fill #4

## 2016-03-18 ENCOUNTER — Other Ambulatory Visit: Payer: Self-pay

## 2016-03-18 ENCOUNTER — Encounter: Payer: Self-pay | Admitting: Family Medicine

## 2016-03-18 ENCOUNTER — Ambulatory Visit: Payer: Self-pay | Attending: Family Medicine | Admitting: Family Medicine

## 2016-03-18 VITALS — BP 123/84 | HR 92 | Temp 98.2°F | Ht 64.0 in | Wt 218.0 lb

## 2016-03-18 DIAGNOSIS — E669 Obesity, unspecified: Secondary | ICD-10-CM | POA: Insufficient documentation

## 2016-03-18 DIAGNOSIS — Z6837 Body mass index (BMI) 37.0-37.9, adult: Secondary | ICD-10-CM | POA: Insufficient documentation

## 2016-03-18 DIAGNOSIS — F17210 Nicotine dependence, cigarettes, uncomplicated: Secondary | ICD-10-CM | POA: Insufficient documentation

## 2016-03-18 DIAGNOSIS — F418 Other specified anxiety disorders: Secondary | ICD-10-CM | POA: Insufficient documentation

## 2016-03-18 DIAGNOSIS — Z23 Encounter for immunization: Secondary | ICD-10-CM

## 2016-03-18 DIAGNOSIS — M545 Low back pain: Secondary | ICD-10-CM | POA: Insufficient documentation

## 2016-03-18 DIAGNOSIS — G8929 Other chronic pain: Secondary | ICD-10-CM

## 2016-03-18 DIAGNOSIS — E119 Type 2 diabetes mellitus without complications: Secondary | ICD-10-CM

## 2016-03-18 DIAGNOSIS — F329 Major depressive disorder, single episode, unspecified: Secondary | ICD-10-CM

## 2016-03-18 DIAGNOSIS — Z7984 Long term (current) use of oral hypoglycemic drugs: Secondary | ICD-10-CM | POA: Insufficient documentation

## 2016-03-18 DIAGNOSIS — F419 Anxiety disorder, unspecified: Secondary | ICD-10-CM

## 2016-03-18 LAB — GLUCOSE, POCT (MANUAL RESULT ENTRY): POC GLUCOSE: 99 mg/dL (ref 70–99)

## 2016-03-18 LAB — POCT GLYCOSYLATED HEMOGLOBIN (HGB A1C): HEMOGLOBIN A1C: 7

## 2016-03-18 MED ORDER — TRUEPLUS LANCETS 28G MISC: 1.0000 | Freq: Three times a day (TID) | 11 refills | Status: DC

## 2016-03-18 MED ORDER — ACETAMINOPHEN-CODEINE #3 300-30 MG PO TABS: 1.0000 | ORAL_TABLET | Freq: Two times a day (BID) | ORAL | 0 refills | Status: DC

## 2016-03-18 MED ORDER — ACCU-CHEK SOFTCLIX LANCETS MISC: 1.0000 | Freq: Three times a day (TID) | 12 refills | Status: DC

## 2016-03-18 MED ORDER — TRUE METRIX METER W/DEVICE KIT: 1.0000 | PACK | 0 refills | Status: DC | PRN

## 2016-03-18 MED ORDER — BUSPIRONE HCL 5 MG PO TABS: 5.0000 mg | ORAL_TABLET | Freq: Two times a day (BID) | ORAL | 0 refills | Status: DC

## 2016-03-18 MED ORDER — GLUCOSE BLOOD VI STRP: 1.0000 | ORAL_STRIP | Freq: Three times a day (TID) | 11 refills | Status: DC

## 2016-03-18 MED FILL — TRUEplus LANCETS 28G MISC: 30 days supply | Qty: 100 | Fill #0

## 2016-03-18 MED FILL — busPIRone HCL 5 MG TABS: 5 | 30 days supply | Qty: 60 | Fill #0

## 2016-03-18 MED FILL — TRUE METRIX BLOOD GLUCOSE M: W/DEVICE | 1 days supply | Qty: 1 | Fill #0

## 2016-03-18 MED FILL — TRUE METRIX TEST STRIP: 30 days supply | Qty: 100 | Fill #0

## 2016-03-18 NOTE — Progress Notes (Signed)
Subjective:  Patient ID: Courtney Dixon, female    DOB: 18-Oct-1965  Age: 50 y.o. MRN: [id number]  CC: Diabetes   HPI Mariafernanda Hendricksen presents for   1. CHRONIC DIABETES: diabetes dx 10/21/2014 with A1c 6.8   Disease Monitoring  Blood Sugar Ranges: not checking   Polyuria: no   Visual problems: no   Medication Compliance: yes  Medication Side Effects  Hypoglycemia: yes, one subjective low after first starting metformin, 3 months ago   [place] Exam: due   Foot Exam: done today   Diet pattern:   Exercise: none   2. Anxiety and depression: persistent anxiety. Taking effexor. Still anxious and often agitated. Request additional medication for anxiety.  3. Low back pain; chronic since childhood. No definite injury. Obese. Taking ibuprofen multiple times per day for back pain.   Social History  Substance Use Topics  . Smoking status: Current Every Day Smoker    Types: Cigarettes  . Smokeless tobacco: Not on file  . Alcohol use Yes    Outpatient Medications Prior to Visit  Medication Sig Dispense Refill  . atorvastatin (LIPITOR) 40 MG tablet Take 1 tablet (40 mg total) by mouth daily. 90 tablet 3  . hydrochlorothiazide (HYDRODIURIL) 25 MG tablet Take 1 tablet (25 mg total) by mouth daily. 30 tablet 5  . ibuprofen (ADVIL,MOTRIN) 200 MG tablet Take 800 mg by mouth every 6 (six) hours as needed. For pain.    Marland Kitchen lisinopril (PRINIVIL,ZESTRIL) 40 MG tablet Take 1 tablet (40 mg total) by mouth daily. 30 tablet 5  . metFORMIN (GLUCOPHAGE-XR) 500 MG 24 hr tablet Take 2 tablets (1,000 mg total) by mouth daily after supper. 60 tablet 2  . venlafaxine XR (EFFEXOR-XR) 150 MG 24 hr capsule Take 1 capsule (150 mg total) by mouth daily with breakfast. 30 capsule 2   No facility-administered medications prior to visit.     ROS Review of Systems  Constitutional: Negative for chills and fever.  Eyes: Negative for visual disturbance.  Respiratory: Negative for shortness of  breath.   Cardiovascular: Negative for chest pain.  Gastrointestinal: Negative for abdominal pain and blood in stool.  Musculoskeletal: Positive for back pain. Negative for arthralgias.  Skin: Negative for rash.  Allergic/Immunologic: Negative for immunocompromised state.  Hematological: Negative for adenopathy. Does not bruise/bleed easily.  Psychiatric/Behavioral: Negative for dysphoric mood and suicidal ideas. The patient is nervous/anxious.     Objective:  BP 123/84 (BP Location: Right Arm, Patient Position: Sitting, Cuff Size: Large)   Pulse 92   Temp 98.2 F (36.8 C) (Oral)   Ht _0  (1.626 m)   Wt 218 lb (98.9 kg)   SpO2 98%   BMI 37.42 kg/m   BP/Weight 03/18/2016 7/78/2423 10/15/6142  Systolic BP 315 400 867  Diastolic BP 84 91 93  Wt. (Lbs) 218 220 218  BMI 37.42 37.74 37.4   Physical Exam  Constitutional: She is oriented to person, place, and time. She appears well-developed and well-nourished. No distress.  HENT:  Head: Normocephalic and atraumatic.  Cardiovascular: Normal rate, regular rhythm, normal heart sounds and intact distal pulses.   Pulmonary/Chest: Effort normal and breath sounds normal.  Musculoskeletal: She exhibits no edema.  Back Exam: Back: Normal Curvature, no deformities or CVA tenderness  Paraspinal Tenderness: b/l lumbar   LE Strength 5/5  LE Sensation: in tact  LE Reflexes 2+ and symmetric  Straight leg raise: negative    Neurological: She is alert and oriented to person, place,  and time.  Skin: Skin is warm and dry. No rash noted.  Psychiatric: She has a normal mood and affect.   Lab Results  Component Value Date   HGBA1C 6.8 10/21/2015   Lab Results  Component Value Date   HGBA1C 7.0 03/18/2016    CBG 99 GAD 7 : Generalized Anxiety Score 03/18/2016 11/28/2015 10/21/2015  Nervous, Anxious, on Edge _0 Control/stop worrying _1 Worry too much - different things _2 Trouble relaxing _3 Restless _4 Easily annoyed or  irritable _5 Afraid - awful might happen _6 Total GAD 7 Score _7 Depression screen [facility] 2/9 03/18/2016 11/28/2015 10/21/2015  Decreased Interest _8 Down, Depressed, Hopeless _9 PHQ - 2 Score _10 Altered sleeping _11 Tired, decreased energy _12 Change in appetite _13 Feeling bad or failure about yourself  _14 Trouble concentrating _15 Moving slowly or fidgety/restless 0 0 2  Suicidal thoughts 1 0 0  PHQ-9 Score _16 Assessment & Plan:   Aianna was seen today for diabetes.  Diagnoses and all orders for this visit:  Controlled type 2 diabetes mellitus without complication, without long-term current use of insulin (HCC) -     POCT glucose (manual entry) -     POCT glycosylated hemoglobin (Hb A1C) -     Blood Glucose Monitoring Suppl (TRUE METRIX METER) w/Device KIT; 1 each by Does not apply route as needed. -     glucose blood (TRUE METRIX BLOOD GLUCOSE TEST) test strip; 1 each by Other route 3 (three) times daily. -     Discontinue: ACCU-CHEK SOFTCLIX LANCETS lancets; 1 each by Other route 3 (three) times daily. -     TRUEPLUS LANCETS 28G MISC; 1 each by Does not apply route 3 (three) times daily.  Chronic bilateral low back pain, with sciatica presence unspecified -     acetaminophen-codeine (TYLENOL #3) 300-30 MG tablet; Take 1 tablet by mouth 2 (two) times daily.  Anxiety and depression -     busPIRone (BUSPAR) 5 MG tablet; Take 1 tablet (5 mg total) by mouth 2 (two) times daily.  Encounter for immunization -     Flu Vaccine QUAD 36+ mos IM   No orders of the defined types were placed in this encounter.   Follow-up: Return in about 4 weeks (around 04/15/2016) for anxiety .   Boykin Nearing MD

## 2016-03-18 NOTE — Patient Instructions (Addendum)
Seher was seen today for diabetes.  Diagnoses and all orders for this visit:  Controlled type 2 diabetes mellitus without complication, without long-term current use of insulin (HCC) -     POCT glucose (manual entry) -     POCT glycosylated hemoglobin (Hb A1C) -     Blood Glucose Monitoring Suppl (TRUE METRIX METER) w/Device KIT; 1 each by Does not apply route as needed. -     glucose blood (TRUE METRIX BLOOD GLUCOSE TEST) test strip; 1 each by Other route 3 (three) times daily. -     Discontinue: ACCU-CHEK SOFTCLIX LANCETS lancets; 1 each by Other route 3 (three) times daily. -     TRUEPLUS LANCETS 28G MISC; 1 each by Does not apply route 3 (three) times daily.  Chronic bilateral low back pain, with sciatica presence unspecified -     acetaminophen-codeine (TYLENOL #3) 300-30 MG tablet; Take 1 tablet by mouth 2 (two) times daily.  Anxiety and depression -     busPIRone (BUSPAR) 5 MG tablet; Take 1 tablet (5 mg total) by mouth 2 (two) times daily.   F/u in 4 weeks for anxiety and back pain since we are starting buspar and tylenol #3 today  Dr. Adrian Blackwater

## 2016-03-18 NOTE — Progress Notes (Signed)
Pt declined flu shot.  Pt has no test strips to check her glucose level.

## 2016-03-19 NOTE — Assessment & Plan Note (Signed)
Anxiety and depression with persistent anxiety Advised counseling, mental health resources provided Add buspar to effexor

## 2016-03-19 NOTE — Assessment & Plan Note (Signed)
Remain controlled continue metformin Add exercise

## 2016-03-19 NOTE — Assessment & Plan Note (Signed)
Chronic pain in low back without radicular symptoms Plan Tylenol #3, ibuprofen Weight loss Home PT

## 2016-03-30 ENCOUNTER — Other Ambulatory Visit: Payer: Self-pay | Admitting: Family Medicine

## 2016-03-30 DIAGNOSIS — F32A Depression, unspecified: Secondary | ICD-10-CM

## 2016-03-30 DIAGNOSIS — F419 Anxiety disorder, unspecified: Principal | ICD-10-CM

## 2016-03-30 DIAGNOSIS — F329 Major depressive disorder, single episode, unspecified: Secondary | ICD-10-CM

## 2016-03-30 MED FILL — LISINOPRIL 40 MG TABLET: 40 | 30 days supply | Qty: 30 | Fill #5

## 2016-03-30 MED FILL — VENLAFAXINE HCL ER 150 MG C: 150 | 30 days supply | Qty: 30 | Fill #0

## 2016-03-30 MED FILL — HYDROCHLOROTHIAZIDE 25 MG T: 25 | 30 days supply | Qty: 30 | Fill #5

## 2016-04-02 MED FILL — METFORMIN HCL ER 500 MG TAB: 500 | 30 days supply | Qty: 60 | Fill #0

## 2016-04-30 ENCOUNTER — Other Ambulatory Visit: Payer: Self-pay | Admitting: Family Medicine

## 2016-04-30 DIAGNOSIS — I10 Essential (primary) hypertension: Secondary | ICD-10-CM

## 2016-04-30 MED FILL — VENLAFAXINE HCL ER 150 MG C: 150 | 30 days supply | Qty: 30 | Fill #1

## 2016-05-03 MED FILL — LISINOPRIL 40 MG TABLET: 40 | 30 days supply | Qty: 30 | Fill #0

## 2016-05-03 MED FILL — HYDROCHLOROTHIAZIDE 25 MG T: 25 | 30 days supply | Qty: 30 | Fill #0

## 2016-05-13 ENCOUNTER — Encounter: Payer: Self-pay | Admitting: Family Medicine

## 2016-05-13 ENCOUNTER — Ambulatory Visit: Payer: Self-pay | Attending: Family Medicine | Admitting: Family Medicine

## 2016-05-13 VITALS — BP 162/124 | HR 78 | Temp 98.6°F | Ht 64.0 in | Wt 224.6 lb

## 2016-05-13 DIAGNOSIS — I10 Essential (primary) hypertension: Secondary | ICD-10-CM

## 2016-05-13 DIAGNOSIS — E119 Type 2 diabetes mellitus without complications: Secondary | ICD-10-CM

## 2016-05-13 DIAGNOSIS — Z0001 Encounter for general adult medical examination with abnormal findings: Secondary | ICD-10-CM | POA: Insufficient documentation

## 2016-05-13 DIAGNOSIS — F17210 Nicotine dependence, cigarettes, uncomplicated: Secondary | ICD-10-CM | POA: Insufficient documentation

## 2016-05-13 DIAGNOSIS — Z23 Encounter for immunization: Secondary | ICD-10-CM

## 2016-05-13 DIAGNOSIS — F419 Anxiety disorder, unspecified: Secondary | ICD-10-CM | POA: Insufficient documentation

## 2016-05-13 DIAGNOSIS — F418 Other specified anxiety disorders: Secondary | ICD-10-CM

## 2016-05-13 DIAGNOSIS — M25532 Pain in left wrist: Secondary | ICD-10-CM

## 2016-05-13 DIAGNOSIS — Z7984 Long term (current) use of oral hypoglycemic drugs: Secondary | ICD-10-CM | POA: Insufficient documentation

## 2016-05-13 DIAGNOSIS — F329 Major depressive disorder, single episode, unspecified: Secondary | ICD-10-CM | POA: Insufficient documentation

## 2016-05-13 DIAGNOSIS — Z79899 Other long term (current) drug therapy: Secondary | ICD-10-CM | POA: Insufficient documentation

## 2016-05-13 DIAGNOSIS — M79672 Pain in left foot: Secondary | ICD-10-CM

## 2016-05-13 LAB — GLUCOSE, POCT (MANUAL RESULT ENTRY): POC Glucose: 161 mg/dl — AB (ref 70–99)

## 2016-05-13 MED ORDER — DICLOFENAC SODIUM 75 MG PO TBEC: 75.0000 mg | DELAYED_RELEASE_TABLET | Freq: Two times a day (BID) | ORAL | 2 refills | Status: DC

## 2016-05-13 MED ORDER — VENLAFAXINE HCL ER 75 MG PO CP24: 225.0000 mg | ORAL_CAPSULE | Freq: Every day | ORAL | 2 refills | Status: DC

## 2016-05-13 MED ORDER — CLONIDINE HCL 0.1 MG PO TABS
0.2000 mg | ORAL_TABLET | Freq: Once | ORAL | Status: AC
  Administered 2016-05-13: 0.2 mg via ORAL

## 2016-05-13 MED ORDER — BUSPIRONE HCL 10 MG PO TABS: 10.0000 mg | ORAL_TABLET | Freq: Two times a day (BID) | ORAL | 2 refills | Status: DC

## 2016-05-13 MED ORDER — GABAPENTIN 300 MG PO CAPS: 300.0000 mg | ORAL_CAPSULE | Freq: Two times a day (BID) | ORAL | 3 refills | Status: DC

## 2016-05-13 MED FILL — busPIRone HCL 10 MG TABS: 10 | 30 days supply | Qty: 60 | Fill #0

## 2016-05-13 MED FILL — DICLOFENAC SOD DR 75 MG TAB: 75 | 30 days supply | Qty: 60 | Fill #0

## 2016-05-13 MED FILL — GABAPENTIN 300 MG CAPSULE: 300 | 30 days supply | Qty: 60 | Fill #0

## 2016-05-13 NOTE — Patient Instructions (Addendum)
Courtney Dixon was seen today for back pain.  Diagnoses and all orders for this visit:  Controlled type 2 diabetes mellitus without complication, without long-term current use of insulin (HCC) -     POCT glucose (manual entry)  Essential hypertension -     cloNIDine (CATAPRES) tablet 0.2 mg; Take 2 tablets (0.2 mg total) by mouth once.  Anxiety and depression -     venlafaxine XR (EFFEXOR-XR) 75 MG 24 hr capsule; Take 3 capsules (225 mg total) by mouth daily with breakfast. -     busPIRone (BUSPAR) 10 MG tablet; Take 1 tablet (10 mg total) by mouth 2 (two) times daily.  Pain of left heel -     gabapentin (NEURONTIN) 300 MG capsule; Take 1 capsule (300 mg total) by mouth 2 (two) times daily. -     diclofenac (VOLTAREN) 75 MG EC tablet; Take 1 tablet (75 mg total) by mouth 2 (two) times daily.  Left wrist pain -     gabapentin (NEURONTIN) 300 MG capsule; Take 1 capsule (300 mg total) by mouth 2 (two) times daily. -     diclofenac (VOLTAREN) 75 MG EC tablet; Take 1 tablet (75 mg total) by mouth 2 (two) times daily.   Start gabapentin 300 mg nightly for first week, then twice daily  Restart lisinopril  Ice L wrist and L heel for 20 minutes twice daily   F/u in 4 weeks for mood and HTN Dr. Adrian Blackwater

## 2016-05-13 NOTE — Progress Notes (Signed)
Pt is here today for back, left foot and left hand pain. Pt pain level is 7.  Pt is getting tdap shot today.

## 2016-05-13 NOTE — Progress Notes (Signed)
Subjective:  Patient ID: Courtney Dixon, female    DOB: [birth year]  Age: 50 y.o. MRN: [id number]  CC: Back Pain   HPI Courtney Dixon presents for   1. HTN: non-compliant with lisinopril. No HA, C or SOB. She has gained weight.   2. Anxiety and depression: persistent anxiety. Taking effexor. Still anxious and often agitated. Request additional medication for anxiety.  3. Low back pain; chronic since childhood. No definite injury. Obese. Taking ibuprofen multiple times per day for back pain.   4. Left wrist pain: for past 6 months. Worsening over past month. Numbness in L palm radiating up to L elbow. Pain occurs daily. Pain is exacerbated by making a fist. Soreness in both medial elbows. She has tried ibuprofen that does help ease her pain.   5. Left heel pain: posterior heel pain and L lateral foot pain. X 1 week. Pain started while on vacation and doing more activity. She has pulling sensation when she steps.    Social History  Substance Use Topics  . Smoking status: Current Every Day Smoker    Types: Cigarettes  . Smokeless tobacco: Not on file  . Alcohol use Yes    Outpatient Medications Prior to Visit  Medication Sig Dispense Refill  . atorvastatin (LIPITOR) 40 MG tablet Take 1 tablet (40 mg total) by mouth daily. 90 tablet 3  . Blood Glucose Monitoring Suppl (TRUE METRIX METER) w/Device KIT 1 each by Does not apply route as needed. 1 kit 0  . glucose blood (TRUE METRIX BLOOD GLUCOSE TEST) test strip 1 each by Other route 3 (three) times daily. 100 each 11  . hydrochlorothiazide (HYDRODIURIL) 25 MG tablet TAKE 1 TABLET BY MOUTH DAILY. 30 tablet 5  . ibuprofen (ADVIL,MOTRIN) 200 MG tablet Take 800 mg by mouth every 6 (six) hours as needed. For pain.    Marland Kitchen lisinopril (PRINIVIL,ZESTRIL) 40 MG tablet TAKE 1 TABLET BY MOUTH DAILY. 30 tablet 5  . metFORMIN (GLUCOPHAGE-XR) 500 MG 24 hr tablet Take 2 tablets (1,000 mg total) by mouth daily after supper. 60 tablet 2  . TRUEPLUS LANCETS  28G MISC 1 each by Does not apply route 3 (three) times daily. 100 each 11  . venlafaxine XR (EFFEXOR-XR) 150 MG 24 hr capsule TAKE 1 CAPSULE BY MOUTH DAILY WITH BREAKFAST. 30 capsule 2  . acetaminophen-codeine (TYLENOL #3) 300-30 MG tablet Take 1 tablet by mouth 2 (two) times daily. (Patient not taking: Reported on 05/13/2016) 60 tablet 0  . busPIRone (BUSPAR) 5 MG tablet Take 1 tablet (5 mg total) by mouth 2 (two) times daily. (Patient not taking: Reported on 05/13/2016) 60 tablet 0   No facility-administered medications prior to visit.     ROS Review of Systems  Constitutional: Negative for chills and fever.  Eyes: Negative for visual disturbance.  Respiratory: Negative for shortness of breath.   Cardiovascular: Negative for chest pain.  Gastrointestinal: Negative for abdominal pain and blood in stool.  Musculoskeletal: Positive for back pain. Negative for arthralgias.  Skin: Negative for rash.  Allergic/Immunologic: Negative for immunocompromised state.  Hematological: Negative for adenopathy. Does not bruise/bleed easily.  Psychiatric/Behavioral: Negative for dysphoric mood and suicidal ideas. The patient is nervous/anxious.     Objective:  BP (!) 162/124 (BP Location: Left Arm, Patient Position: Sitting, Cuff Size: Small)   Pulse 78   Temp 98.6 F (37 C) (Oral)   Ht _0  (1.626 m)   Wt 224 lb 9.6 oz (101.9 kg)   SpO2 97%  BMI 38.55 kg/m   BP/Weight 05/13/2016 03/18/2016 0/67/7034  Systolic BP 035 248 185  Diastolic BP 909 84 91  Wt. (Lbs) 224.6 218 220  BMI 38.55 37.42 37.74   Physical Exam  Constitutional: She is oriented to person, place, and time. She appears well-developed and well-nourished. No distress.  HENT:  Head: Normocephalic and atraumatic.  Cardiovascular: Normal rate, regular rhythm, normal heart sounds and intact distal pulses.   Pulmonary/Chest: Effort normal and breath sounds normal.  Musculoskeletal: She exhibits no edema.  Back Exam: Back:  Normal Curvature, no deformities or CVA tenderness  Paraspinal Tenderness: b/l lumbar   LE Strength 5/5  LE Sensation: in tact  LE Reflexes 2+ and symmetric  Straight leg raise: negative    Neurological: She is alert and oriented to person, place, and time.  Skin: Skin is warm and dry. No rash noted.  Psychiatric: She has a normal mood and affect.   Lab Results  Component Value Date   HGBA1C 7.0 03/18/2016    CBG 161 GAD 7 : Generalized Anxiety Score 05/13/2016 03/18/2016 11/28/2015 10/21/2015  Nervous, Anxious, on Edge _0 Control/stop worrying _1 Worry too much - different things _2 Trouble relaxing _3 Restless _4 Easily annoyed or irritable _5 Afraid - awful might happen _6 Total GAD 7 Score _7 Depression screen [facility] 2/9 05/13/2016 03/18/2016 11/28/2015  Decreased Interest _8 Down, Depressed, Hopeless _9 PHQ - 2 Score _10 Altered sleeping _11 Tired, decreased energy _12 Change in appetite 0 2 1  Feeling bad or failure about yourself  _13 Trouble concentrating _14 Moving slowly or fidgety/restless 0 0 0  Suicidal thoughts 0 1 0  PHQ-9 Score _15 Assessment & Plan:   Courtney Dixon was seen today for back pain.  Diagnoses and all orders for this visit:  Controlled type 2 diabetes mellitus without complication, without long-term current use of insulin (HCC) -     POCT glucose (manual entry)  Essential hypertension -     cloNIDine (CATAPRES) tablet 0.2 mg; Take 2 tablets (0.2 mg total) by mouth once.  Anxiety and depression -     venlafaxine XR (EFFEXOR-XR) 75 MG 24 hr capsule; Take 3 capsules (225 mg total) by mouth daily with breakfast. -     busPIRone (BUSPAR) 10 MG tablet; Take 1 tablet (10 mg total) by mouth 2 (two) times daily.  Pain of left heel -     gabapentin (NEURONTIN) 300 MG capsule; Take 1 capsule (300 mg total) by mouth 2 (two) times daily. -     diclofenac (VOLTAREN) 75 MG EC  tablet; Take 1 tablet (75 mg total) by mouth 2 (two) times daily.  Left wrist pain -     gabapentin (NEURONTIN) 300 MG capsule; Take 1 capsule (300 mg total) by mouth 2 (two) times daily. -     diclofenac (VOLTAREN) 75 MG EC tablet; Take 1 tablet (75 mg total) by mouth 2 (two) times daily.  Other orders -     Tdap vaccine greater than or equal to 7yo IM   Meds ordered this encounter  Medications  . cloNIDine (CATAPRES) tablet 0.2 mg    Follow-up: Return in  about 4 weeks (around 06/10/2016) for HTN.   Boykin Nearing MD

## 2016-05-14 DIAGNOSIS — M25532 Pain in left wrist: Secondary | ICD-10-CM | POA: Insufficient documentation

## 2016-05-14 DIAGNOSIS — M79672 Pain in left foot: Secondary | ICD-10-CM | POA: Insufficient documentation

## 2016-05-14 NOTE — Assessment & Plan Note (Signed)
Suspect carpal tunnel Add gabapentin Start NSAID-voltaren

## 2016-05-14 NOTE — Assessment & Plan Note (Signed)
Slightly improved Increase Effexor Increase Buspar Advised patient go to mental health

## 2016-05-14 NOTE — Assessment & Plan Note (Signed)
Suspect Achilles tendinopathy  Add gabapentin Start NSAID-voltaren

## 2016-05-14 NOTE — Assessment & Plan Note (Addendum)
Elevated today Treated with clonidine Restart lisinopril  Close f.u for recheck, she made need a second agent

## 2016-05-18 ENCOUNTER — Emergency Department (HOSPITAL_BASED_OUTPATIENT_CLINIC_OR_DEPARTMENT_OTHER)
Admission: EM | Admit: 2016-05-18 | Discharge: 2016-05-18 | Disposition: A | Discharge status: Home / Self Care | Payer: Self-pay | Attending: Emergency Medicine | Admitting: Emergency Medicine

## 2016-05-18 ENCOUNTER — Encounter (HOSPITAL_BASED_OUTPATIENT_CLINIC_OR_DEPARTMENT_OTHER): Payer: Self-pay | Admitting: *Deleted

## 2016-05-18 ENCOUNTER — Emergency Department (HOSPITAL_BASED_OUTPATIENT_CLINIC_OR_DEPARTMENT_OTHER): Payer: Self-pay

## 2016-05-18 DIAGNOSIS — E119 Type 2 diabetes mellitus without complications: Secondary | ICD-10-CM | POA: Insufficient documentation

## 2016-05-18 DIAGNOSIS — Y929 Unspecified place or not applicable: Secondary | ICD-10-CM | POA: Insufficient documentation

## 2016-05-18 DIAGNOSIS — Y9389 Activity, other specified: Secondary | ICD-10-CM | POA: Insufficient documentation

## 2016-05-18 DIAGNOSIS — F17210 Nicotine dependence, cigarettes, uncomplicated: Secondary | ICD-10-CM | POA: Insufficient documentation

## 2016-05-18 DIAGNOSIS — Z79899 Other long term (current) drug therapy: Secondary | ICD-10-CM | POA: Insufficient documentation

## 2016-05-18 DIAGNOSIS — Y999 Unspecified external cause status: Secondary | ICD-10-CM | POA: Insufficient documentation

## 2016-05-18 DIAGNOSIS — W268XXA Contact with other sharp object(s), not elsewhere classified, initial encounter: Secondary | ICD-10-CM | POA: Insufficient documentation

## 2016-05-18 DIAGNOSIS — Z7984 Long term (current) use of oral hypoglycemic drugs: Secondary | ICD-10-CM | POA: Insufficient documentation

## 2016-05-18 DIAGNOSIS — I10 Essential (primary) hypertension: Secondary | ICD-10-CM | POA: Insufficient documentation

## 2016-05-18 DIAGNOSIS — S61011A Laceration without foreign body of right thumb without damage to nail, initial encounter: Secondary | ICD-10-CM

## 2016-05-18 HISTORY — DX: Anxiety disorder, unspecified: F41.9

## 2016-05-18 HISTORY — DX: Depression, unspecified: F32.A

## 2016-05-18 HISTORY — DX: Major depressive disorder, single episode, unspecified: F32.9

## 2016-05-18 MED ORDER — TRAMADOL HCL 50 MG PO TABS
50.0000 mg | ORAL_TABLET | Freq: Once | ORAL | Status: AC
  Administered 2016-05-18: 50 mg via ORAL
  Filled 2016-05-18: qty 1

## 2016-05-18 MED ORDER — CEPHALEXIN 250 MG PO CAPS
500.0000 mg | ORAL_CAPSULE | Freq: Once | ORAL | Status: AC
  Administered 2016-05-18: 500 mg via ORAL
  Filled 2016-05-18: qty 2

## 2016-05-18 MED ORDER — CEPHALEXIN 250 MG PO CAPS
500.0000 mg | ORAL_CAPSULE | Freq: Once | ORAL | Status: DC
  Filled 2016-05-18: qty 2

## 2016-05-18 MED ORDER — CEPHALEXIN 500 MG PO CAPS: 500.0000 mg | ORAL_CAPSULE | Freq: Two times a day (BID) | ORAL | 0 refills | Status: AC

## 2016-05-18 MED ORDER — LIDOCAINE HCL (PF) 1 % IJ SOLN
5.0000 mL | Freq: Once | INTRAMUSCULAR | Status: AC
  Administered 2016-05-18: 5 mL
  Filled 2016-05-18: qty 5

## 2016-05-18 NOTE — ED Provider Notes (Signed)
Emergency Department Provider Note   I have reviewed the triage vital signs and the nursing notes.   HISTORY  Chief Complaint Laceration   HPI Courtney Dixon is a 50 y.o. female with PMH of anxiety, depression, HTN presents to the emergent department for evaluation of laceration to the right thumb with some numbness. The patient reports that she was opening a can of tuna and cut herself with the lid. She denies significant bleeding but had some pain. The injury occurred 2-3 hours prior to ED presentation. She is complaining of circumferential numbness at the tip of the thumb. She reports that her last tetanus shot was last week during an annual physical. Pain made worse with movement.    Past Medical History:  Diagnosis Date  . Anxiety   . Depression   . Hypertension   . Migraines     Patient Active Problem List   Diagnosis Date Noted  . Pain of left heel 05/14/2016  . Left wrist pain 05/14/2016  . Obesity (BMI 30-39.9) 10/21/2015  . Diabetes type 2, controlled ([place]) 10/21/2015  . Chronic low back pain 10/21/2015  . Sensation of feeling hot 10/21/2015  . Hyperlipidemia 10/21/2015  . Smoking 10/15/2013  . HTN (hypertension) 10/15/2013  . Anxiety and depression 10/15/2013    Past Surgical History:  Procedure Laterality Date  . CESAREAN SECTION    . TUBAL LIGATION      Current Outpatient Rx  . Order #: [id number] Class: Normal  . Order #: [id number] Class: Normal  . Order #: [id number] Class: Normal  . Order #: [id number] Class: Normal  . Order #: [id number] Class: Normal  . Order #: [id number] Class: Normal  . Order #: [id number] Class: Normal  . Order #: [id number] Class: Normal  . Order #: [id number] Class: Normal  . Order #: [id number] Class: Normal  . Order #: [id number] Class: Normal  . Order #: [id number] Class: Print    Allergies Sulfa antibiotics  Family History  Problem Relation Age of Onset  . Cancer Mother   . Drug abuse Mother   . Heart disease Mother   .  Hypertension Mother   . Diabetes Mother   . Cancer Father   . Diabetes Sister   . Diabetes Sister     Social History Social History  Substance Use Topics  . Smoking status: Current Every Day Smoker    Packs/day: 0.50    Types: Cigarettes  . Smokeless tobacco: Never Used  . Alcohol use Yes    Review of Systems  Constitutional: No fever/chills Eyes: No visual changes. ENT: No sore throat. Cardiovascular: Denies chest pain. Respiratory: Denies shortness of breath. Gastrointestinal: No abdominal pain.  No nausea, no vomiting.  No diarrhea.  No constipation. Genitourinary: Negative for dysuria. Musculoskeletal: Negative for back pain. Skin: Negative for rash. Positive thumb laceration.  Neurological: Negative for headaches, focal weakness or numbness.  10-point ROS otherwise negative.  ____________________________________________   PHYSICAL EXAM:  VITAL SIGNS: ED Triage Vitals [05/18/16 1547]  Enc Vitals Group     BP 144/93     Pulse Rate 104     Resp 20     Temp 98.2 F (36.8 C)     Temp Source Oral     SpO2 93 %     Weight 222 lb (100.7 kg)     Height 5\' 4"  (1.626 m)     Pain Score 6   Constitutional: Alert and oriented. Well appearing and in no acute distress. Eyes: Conjunctivae are normal.  Head: Atraumatic. Nose: No congestion/rhinnorhea. Mouth/Throat: Mucous membranes  are moist.  Oropharynx non-erythematous. Neck: No stridor.   Cardiovascular: Normal rate, regular rhythm. Good peripheral circulation. Grossly normal heart sounds.   Respiratory: Normal respiratory effort.  No retractions. Lungs CTAB. Gastrointestinal: Soft and nontender. No distention.  Musculoskeletal: No lower extremity tenderness nor edema. No gross deformities of extremities. Full ROM of the right thumb.  Neurologic:  Normal speech and language. Subjective numbness at the tip of the thumb. Normal capillary refill.  Skin:  Skin is warm and dry. 3 cm laceration to the medial aspect of  the web space between the 1st and 2nd digits. Visible sub-q and muscle with no obvious muscle injury or laceration.   ____________________________________________  RADIOLOGY  Dg Hand Complete Right  Result Date: 05/18/2016 CLINICAL DATA:  50 year old female with laceration on to new can lead. Pain and numbness. Initial encounter. EXAM: RIGHT HAND - COMPLETE 3+ VIEW COMPARISON:  None. FINDINGS: Bone mineralization is within normal limits. Distal radius and ulna intact. Normal carpal bone alignment. Metacarpals appear intact. Situated near the medial aspect of the first metacarpal is evidence of a soft tissue injury (image 1 arrow). Underlying first MCP joint appears normal. No radiopaque foreign body identified. Joint spaces and alignment elsewhere are within normal limits. Phalanges are intact. IMPRESSION: Soft tissue injury medial to the first metacarpal. No acute osseous abnormality identified. Electronically Signed   By: Genevie Ann M.D.   On: 05/18/2016 16:14    ____________________________________________   PROCEDURES  Procedure(s) performed:   Marland KitchenMarland KitchenLaceration Repair Date/Time: 05/18/2016 10:50 PM Performed by: LONG, JOSHUA G Authorized by: Margette Fast   Consent:    Consent obtained:  Verbal   Consent given by:  Patient   Risks discussed:  Infection, pain, retained foreign body, tendon damage, vascular damage, poor wound healing, poor cosmetic result, need for additional repair and nerve damage   Alternatives discussed:  No treatment Anesthesia (see MAR for exact dosages):    Anesthesia method:  Local infiltration   Local anesthetic:  Lidocaine 1% w/o epi Laceration details:    Location:  Hand   Hand location: base of medial thumb    Length (cm):  3   Depth (mm):  5 Repair type:    Repair type:  Simple Pre-procedure details:    Preparation:  Patient was prepped and draped in usual sterile fashion and imaging obtained to evaluate for foreign bodies Exploration:    Hemostasis  achieved with:  Direct pressure   Wound exploration: wound explored through full range of motion and entire depth of wound probed and visualized     Wound extent: no foreign bodies/material noted, no muscle damage noted, no tendon damage noted, no underlying fracture noted and no vascular damage noted     Wound extent comment:  Pt complaining of numbness at tip of thumb   Contaminated: no   Treatment:    Area cleansed with:  Shur-Clens and saline   Amount of cleaning:  Extensive   Irrigation solution:  Sterile saline   Irrigation volume:  250   Irrigation method:  Pressure wash   Visualized foreign bodies/material removed: no   Skin repair:    Repair method:  Sutures   Suture size:  4-0   Suture material:  Prolene   Suture technique:  Simple interrupted   Number of sutures:  4 Approximation:    Approximation:  Close   V[place] border: well-aligned   Post-procedure details:    Dressing:  Antibiotic ointment and sterile dressing   Patient tolerance of procedure:  Tolerated well, no immediate complications   ____________________________________________   INITIAL IMPRESSION / ASSESSMENT AND PLAN / ED COURSE  Pertinent labs & imaging results that were available during my care of the patient were reviewed by me and considered in my medical decision making (see chart for details).  Patient resents to the emergency department for evaluation of laceration to the thumb. The laceration is through the subcutaneous tissue. Muscle is visible but not obviously damaged. The patient has normal range of motion of the thumb and no tenderness to palpation. Wound is hemostatic. She does have some numbness at the tip of the thumb. Tetanus updated in the last month.   Wound repaired as above. No immediate complication. Plan for abx and hand surgery referral. Patient will return in 5-7 days for suture removal.   At this time, I do not feel there is any life-threatening condition present. I have  reviewed and discussed all results (EKG, imaging, lab, urine as appropriate), exam findings with patient. I have reviewed nursing notes and appropriate previous records.  I feel the patient is safe to be discharged home without further emergent workup. Discussed usual and customary return precautions. Patient and family (if present) verbalize understanding and are comfortable with this plan.  Patient will follow-up with their primary care provider. If they do not have a primary care provider, information for follow-up has been provided to them. All questions have been answered.  ____________________________________________  FINAL CLINICAL IMPRESSION(S) / ED DIAGNOSES  Final diagnoses:  Laceration of right thumb without foreign body, nail damage status unspecified, initial encounter     MEDICATIONS GIVEN DURING THIS VISIT:  Medications  lidocaine (PF) (XYLOCAINE) 1 % injection 5 mL (5 mLs Infiltration Given 05/18/16 1711)  cephALEXin (KEFLEX) capsule 500 mg (500 mg Oral Given 05/18/16 1711)  traMADol (ULTRAM) tablet 50 mg (50 mg Oral Given 05/18/16 1754)     NEW OUTPATIENT MEDICATIONS STARTED DURING THIS VISIT:  Discharge Medication List as of 05/18/2016  5:49 PM    START taking these medications   Details  cephALEXin (KEFLEX) 500 MG capsule Take 1 capsule (500 mg total) by mouth 2 (two) times daily., Starting Tue 05/18/2016, Until Tue 05/25/2016, Print        Note:  This document was prepared using Dragon voice recognition software and may include unintentional dictation errors.  Nanda Quinton, MD Emergency Medicine   Margette Fast, MD 05/18/16 [phone]

## 2016-05-18 NOTE — Discharge Instructions (Signed)
You have been seen in the Emergency Department (ED) today for a laceration (cut).  Please keep the cut clean but do not submerge it in the water.  It has been repaired with staples or sutures that will need to be removed in about 5-7 days. Please follow up with your doctor, an urgent care, or return to the ED for suture removal.    Please take Tylenol (acetaminophen) or Motrin (ibuprofen) as needed for discomfort as written on the box.   Please follow up with your doctor as soon as possible regarding today's emergent visit.   Return to the ED or call your doctor if you notice any signs of infection such as fever, increased pain, increased redness, pus, or other symptoms that concern you.

## 2016-05-18 NOTE — ED Notes (Signed)
ED Provider at bedside. 

## 2016-05-18 NOTE — ED Triage Notes (Signed)
C/o laceration to web of right thumb. States she cut it on a can of tuna. Able to move thumb and forefinger States she can feel thumb when touched but thumb feels numb. NL cap refill and pulses.

## 2016-07-01 MED FILL — LISINOPRIL 40 MG TABLET: 40 | 30 days supply | Qty: 30 | Fill #1

## 2016-07-01 MED FILL — HYDROCHLOROTHIAZIDE 25 MG T: 25 | 30 days supply | Qty: 30 | Fill #1

## 2016-07-06 MED FILL — METFORMIN HCL ER 500 MG TAB: 500 | 30 days supply | Qty: 60 | Fill #1

## 2016-07-06 MED FILL — busPIRone HCL 10 MG TABS: 10 | 30 days supply | Qty: 60 | Fill #1

## 2016-07-09 MED FILL — GABAPENTIN 300 MG CAPSULE: 300 | 30 days supply | Qty: 60 | Fill #1

## 2016-07-20 MED FILL — TRUE METRIX TEST STRIP: 30 days supply | Qty: 100 | Fill #1

## 2016-07-22 ENCOUNTER — Encounter: Payer: Self-pay | Admitting: Family Medicine

## 2016-07-22 ENCOUNTER — Ambulatory Visit: Payer: Self-pay | Attending: Family Medicine | Admitting: Family Medicine

## 2016-07-22 VITALS — BP 142/94 | HR 92 | Temp 98.1°F | Ht 64.0 in | Wt 225.4 lb

## 2016-07-22 DIAGNOSIS — I10 Essential (primary) hypertension: Secondary | ICD-10-CM

## 2016-07-22 DIAGNOSIS — F419 Anxiety disorder, unspecified: Secondary | ICD-10-CM

## 2016-07-22 DIAGNOSIS — G8929 Other chronic pain: Secondary | ICD-10-CM | POA: Insufficient documentation

## 2016-07-22 DIAGNOSIS — E1169 Type 2 diabetes mellitus with other specified complication: Secondary | ICD-10-CM | POA: Insufficient documentation

## 2016-07-22 DIAGNOSIS — Z7984 Long term (current) use of oral hypoglycemic drugs: Secondary | ICD-10-CM | POA: Insufficient documentation

## 2016-07-22 DIAGNOSIS — E1165 Type 2 diabetes mellitus with hyperglycemia: Secondary | ICD-10-CM

## 2016-07-22 DIAGNOSIS — M792 Neuralgia and neuritis, unspecified: Secondary | ICD-10-CM

## 2016-07-22 DIAGNOSIS — F418 Other specified anxiety disorders: Secondary | ICD-10-CM

## 2016-07-22 DIAGNOSIS — Z79899 Other long term (current) drug therapy: Secondary | ICD-10-CM | POA: Insufficient documentation

## 2016-07-22 DIAGNOSIS — IMO0002 Reserved for concepts with insufficient information to code with codable children: Secondary | ICD-10-CM

## 2016-07-22 DIAGNOSIS — E119 Type 2 diabetes mellitus without complications: Secondary | ICD-10-CM

## 2016-07-22 DIAGNOSIS — F329 Major depressive disorder, single episode, unspecified: Secondary | ICD-10-CM | POA: Insufficient documentation

## 2016-07-22 DIAGNOSIS — F17210 Nicotine dependence, cigarettes, uncomplicated: Secondary | ICD-10-CM | POA: Insufficient documentation

## 2016-07-22 DIAGNOSIS — E1142 Type 2 diabetes mellitus with diabetic polyneuropathy: Secondary | ICD-10-CM

## 2016-07-22 LAB — VITAMIN B12: Vitamin B-12: 605 pg/mL (ref 200–1100)

## 2016-07-22 LAB — POCT GLYCOSYLATED HEMOGLOBIN (HGB A1C): Hemoglobin A1C: 8.4

## 2016-07-22 LAB — GLUCOSE, POCT (MANUAL RESULT ENTRY): POC Glucose: 232 mg/dl — AB (ref 70–99)

## 2016-07-22 MED ORDER — PREGABALIN 50 MG PO CAPS: 50.0000 mg | ORAL_CAPSULE | Freq: Three times a day (TID) | ORAL | 3 refills | Status: DC

## 2016-07-22 MED ORDER — TRAMADOL HCL 50 MG PO TABS: 50.0000 mg | ORAL_TABLET | Freq: Three times a day (TID) | ORAL | 0 refills | Status: DC | PRN

## 2016-07-22 MED ORDER — METFORMIN HCL ER 500 MG PO TB24: 1000.0000 mg | ORAL_TABLET | Freq: Every day | ORAL | 2 refills | Status: DC

## 2016-07-22 MED ORDER — GLIMEPIRIDE 2 MG PO TABS: 2.0000 mg | ORAL_TABLET | Freq: Every day | ORAL | 5 refills | Status: DC

## 2016-07-22 MED FILL — GLIMEPIRIDE 2 MG TABLET: 2 | 30 days supply | Qty: 30 | Fill #0

## 2016-07-22 MED FILL — traMADol HCL 50 MG TABS: 50 | 20 days supply | Qty: 60 | Fill #0

## 2016-07-22 NOTE — Patient Instructions (Addendum)
Courtney Dixon was seen today for hypertension.  Diagnoses and all orders for this visit:  Uncontrolled type 2 diabetes mellitus with diabetic polyneuropathy, without long-term current use of insulin (HCC) -     POCT glucose (manual entry) -     POCT glycosylated hemoglobin (Hb A1C) -     Vitamin B12 -     metFORMIN (GLUCOPHAGE-XR) 500 MG 24 hr tablet; Take 2 tablets (1,000 mg total) by mouth daily after supper. -     glimepiride (AMARYL) 2 MG tablet; Take 1 tablet (2 mg total) by mouth daily with breakfast.  Anxiety and depression -     Ambulatory referral to Psychiatry  Nerve pain -     pregabalin (LYRICA) 50 MG capsule; Take 1 capsule (50 mg total) by mouth 3 (three) times daily. For PASS -     traMADol (ULTRAM) 50 MG tablet; Take 1 tablet (50 mg total) by mouth every 8 (eight) hours as needed.   Diabetes blood sugar goals  Fasting (in AM before breakfast, 8 hrs of no eating or drinking (except water or unsweetened coffee or tea): 90-130 2 hrs after meals: < 160,   No low sugars: nothing < 70   F/u in 3 weeks for pain and diabetes   Dr. Adrian Blackwater

## 2016-07-22 NOTE — Assessment & Plan Note (Signed)
Nerve pain  Check vitamin B12 level Increase gabapentin to TID Apply for lyrica via patient assistance program Add tramadol

## 2016-07-22 NOTE — Progress Notes (Signed)
Subjective:  Patient ID: Courtney Dixon, female    DOB: Nov 26, 1965  Age: 51 y.o. MRN: [id number]  CC: Hypertension   HPI Courtney Dixon has HTN, Diabetes, chronic pain, obesity she presents for   1. Body pains: worsening over past 2 months. Cramping and pains in hands. Weakness in both hands now. Started in L hand.  Foot burning and hurt. Feels like toothache in knees and hips. Also tenderness in elbows to touch. No joint swelling or redness. Stopped Effexor went through acute withdrawals. Had swelling with voltaren. Taking gabapentin and buspar. She does not work. She does light house work.   2. Diabetes: she is taking metformin 1000 mg daily. She reports constipation with diabetes.   3. Anxiety and depression: persistent anxiety. Taking Buspar. She stopped taking Effexor after last office visit. She reports depressed mood with excessive crying at times, and other times having elated mood with excessive energy.   4. HTN: compliant with HCTZ and lisinopril. No swelling in legs. No HA, CP or SOB. Admits to anxiety.   Social History  Substance Use Topics  . Smoking status: Current Every Day Smoker    Packs/day: 0.50    Types: Cigarettes  . Smokeless tobacco: Never Used  . Alcohol use Yes    Outpatient Medications Prior to Visit  Medication Sig Dispense Refill  . atorvastatin (LIPITOR) 40 MG tablet Take 1 tablet (40 mg total) by mouth daily. 90 tablet 3  . Blood Glucose Monitoring Suppl (TRUE METRIX METER) w/Device KIT 1 each by Does not apply route as needed. 1 kit 0  . busPIRone (BUSPAR) 10 MG tablet Take 1 tablet (10 mg total) by mouth 2 (two) times daily. 60 tablet 2  . gabapentin (NEURONTIN) 300 MG capsule Take 1 capsule (300 mg total) by mouth 2 (two) times daily. 60 capsule 3  . glucose blood (TRUE METRIX BLOOD GLUCOSE TEST) test strip 1 each by Other route 3 (three) times daily. 100 each 11  . hydrochlorothiazide (HYDRODIURIL) 25 MG tablet TAKE 1 TABLET BY MOUTH DAILY. 30 tablet  5  . lisinopril (PRINIVIL,ZESTRIL) 40 MG tablet TAKE 1 TABLET BY MOUTH DAILY. 30 tablet 5  . metFORMIN (GLUCOPHAGE-XR) 500 MG 24 hr tablet Take 2 tablets (1,000 mg total) by mouth daily after supper. 60 tablet 2  . TRUEPLUS LANCETS 28G MISC 1 each by Does not apply route 3 (three) times daily. 100 each 11  . diclofenac (VOLTAREN) 75 MG EC tablet Take 1 tablet (75 mg total) by mouth 2 (two) times daily. (Patient not taking: Reported on 07/22/2016) 60 tablet 2  . venlafaxine XR (EFFEXOR-XR) 75 MG 24 hr capsule Take 3 capsules (225 mg total) by mouth daily with breakfast. (Patient not taking: Reported on 07/22/2016) 90 capsule 2   No facility-administered medications prior to visit.     ROS Review of Systems  Constitutional: Negative for chills and fever.  Eyes: Negative for visual disturbance.  Respiratory: Negative for shortness of breath.   Cardiovascular: Negative for chest pain.  Gastrointestinal: Negative for abdominal pain and blood in stool.  Musculoskeletal: Positive for arthralgias, back pain and myalgias.  Skin: Negative for rash.  Allergic/Immunologic: Negative for immunocompromised state.  Neurological: Positive for numbness.  Hematological: Negative for adenopathy. Does not bruise/bleed easily.  Psychiatric/Behavioral: Negative for dysphoric mood and suicidal ideas. The patient is nervous/anxious.     Objective:  BP (!) 142/94 (BP Location: Left Arm, Patient Position: Sitting, Cuff Size: Small)   Pulse 92   Temp 98.1  F (36.7 C) (Oral)   Ht 5' 4" (1.626 m)   Wt 225 lb 6.4 oz (102.2 kg)   SpO2 95%   BMI 38.69 kg/m   BP/Weight 07/22/2016 05/18/2016 80/99/8338  Systolic BP 250 539 767  Diastolic BP 94 98 341  Wt. (Lbs) 225.4 222 224.6  BMI 38.69 38.11 38.55   Physical Exam  Constitutional: She is oriented to person, place, and time. She appears well-developed and well-nourished. No distress.  Obese   HENT:  Head: Normocephalic and atraumatic.  Cardiovascular: Normal  rate, regular rhythm, normal heart sounds and intact distal pulses.   Pulmonary/Chest: Effort normal and breath sounds normal.  Musculoskeletal: She exhibits no edema.  Neurological: She is alert and oriented to person, place, and time.  Skin: Skin is warm and dry. No rash noted.  Psychiatric: Her mood appears anxious.   Lab Results  Component Value Date   HGBA1C 8.4 07/22/2016    CBG 232 GAD 7 : Generalized Anxiety Score 07/22/2016 05/13/2016 03/18/2016 11/28/2015  Nervous, Anxious, on Edge _0 Control/stop worrying _1 Worry too much - different things _2 Trouble relaxing _3 Restless _4 Easily annoyed or irritable _5 Afraid - awful might happen _6 Total GAD 7 Score _7 Depression screen [facility] 2/9 07/22/2016 05/13/2016 03/18/2016  Decreased Interest _8 Down, Depressed, Hopeless _9 PHQ - 2 Score _10 Altered sleeping _11 Tired, decreased energy _12 Change in appetite 0 0 2  Feeling bad or failure about yourself  _13 Trouble concentrating _14 Moving slowly or fidgety/restless 1 0 0  Suicidal thoughts 0 0 1  PHQ-9 Score _15 Assessment & Plan:   Courtney Dixon was seen today for hypertension.  Diagnoses and all orders for this visit:  Uncontrolled type 2 diabetes mellitus with diabetic polyneuropathy, without long-term current use of insulin (HCC) -     POCT glucose (manual entry) -     POCT glycosylated hemoglobin (Hb A1C) -     Vitamin B12 -     metFORMIN (GLUCOPHAGE-XR) 500 MG 24 hr tablet; Take 2 tablets (1,000 mg total) by mouth daily after supper. -     glimepiride (AMARYL) 2 MG tablet; Take 1 tablet (2 mg total) by mouth daily with breakfast.  Anxiety and depression -     Ambulatory referral to Psychiatry  Nerve pain -     Discontinue: pregabalin (LYRICA) 50 MG capsule; Take 1 capsule (50 mg total) by mouth 3 (three) times daily. For PASS -     Discontinue: traMADol (ULTRAM) 50 MG tablet; Take  1 tablet (50 mg total) by mouth every 8 (eight) hours as needed. -     traMADol (ULTRAM) 50 MG tablet; Take 1 tablet (50 mg total) by mouth every 8 (eight) hours as needed. -     pregabalin (LYRICA) 50 MG capsule; Take 1 capsule (50 mg total) by mouth 3 (three) times daily. For PASS  Essential hypertension   No orders of the defined types were placed in this encounter.   Follow-up: Return in about 3 weeks (around 08/12/2016) for diabetes and pain .   Boykin Nearing MD

## 2016-07-22 NOTE — Assessment & Plan Note (Signed)
A: HTN with elevated BP today Med: compliant P: Continue current regimen If BP elevated at next OV add norvasc

## 2016-07-22 NOTE — Assessment & Plan Note (Signed)
Uncontrolled diabetes with rise in A1c  Plan: Continue metformin Add amaryl

## 2016-07-22 NOTE — Progress Notes (Signed)
Pt is here today to follow up on HTN. Pt states  That she is having pain all over her body.

## 2016-07-28 MED FILL — HYDROCHLOROTHIAZIDE 25 MG T: 25 | 30 days supply | Qty: 30 | Fill #2

## 2016-07-28 MED FILL — LISINOPRIL 40 MG TABLET: 40 | 30 days supply | Qty: 30 | Fill #2

## 2016-07-30 ENCOUNTER — Ambulatory Visit: Payer: Self-pay | Attending: Family Medicine

## 2016-08-02 ENCOUNTER — Other Ambulatory Visit: Payer: Self-pay

## 2016-08-02 DIAGNOSIS — M792 Neuralgia and neuritis, unspecified: Secondary | ICD-10-CM

## 2016-08-02 MED ORDER — PREGABALIN 50 MG PO CAPS: 50.0000 mg | ORAL_CAPSULE | Freq: Three times a day (TID) | ORAL | 3 refills | Status: DC

## 2016-08-04 ENCOUNTER — Other Ambulatory Visit: Payer: Self-pay | Admitting: Family Medicine

## 2016-08-04 DIAGNOSIS — IMO0002 Reserved for concepts with insufficient information to code with codable children: Secondary | ICD-10-CM

## 2016-08-04 DIAGNOSIS — E1142 Type 2 diabetes mellitus with diabetic polyneuropathy: Secondary | ICD-10-CM

## 2016-08-04 DIAGNOSIS — E1165 Type 2 diabetes mellitus with hyperglycemia: Principal | ICD-10-CM

## 2016-08-06 MED FILL — busPIRone HCL 10 MG TABS: 10 | 30 days supply | Qty: 60 | Fill #2

## 2016-08-06 MED FILL — GABAPENTIN 300 MG CAPSULE: 300 | 30 days supply | Qty: 60 | Fill #2

## 2016-08-09 MED FILL — METFORMIN HCL ER 500 MG TAB: 500 | 30 days supply | Qty: 60 | Fill #0

## 2016-08-13 ENCOUNTER — Ambulatory Visit: Payer: Self-pay | Attending: Family Medicine | Admitting: Family Medicine

## 2016-08-13 ENCOUNTER — Encounter: Payer: Self-pay | Admitting: Family Medicine

## 2016-08-13 VITALS — BP 131/85 | HR 101 | Temp 98.4°F | Ht 64.0 in | Wt 226.0 lb

## 2016-08-13 DIAGNOSIS — Z6838 Body mass index (BMI) 38.0-38.9, adult: Secondary | ICD-10-CM | POA: Insufficient documentation

## 2016-08-13 DIAGNOSIS — F17200 Nicotine dependence, unspecified, uncomplicated: Secondary | ICD-10-CM

## 2016-08-13 DIAGNOSIS — M792 Neuralgia and neuritis, unspecified: Secondary | ICD-10-CM

## 2016-08-13 DIAGNOSIS — Z79899 Other long term (current) drug therapy: Secondary | ICD-10-CM | POA: Insufficient documentation

## 2016-08-13 DIAGNOSIS — E1142 Type 2 diabetes mellitus with diabetic polyneuropathy: Secondary | ICD-10-CM | POA: Insufficient documentation

## 2016-08-13 DIAGNOSIS — G8929 Other chronic pain: Secondary | ICD-10-CM | POA: Insufficient documentation

## 2016-08-13 DIAGNOSIS — F418 Other specified anxiety disorders: Secondary | ICD-10-CM

## 2016-08-13 DIAGNOSIS — Z0001 Encounter for general adult medical examination with abnormal findings: Secondary | ICD-10-CM | POA: Insufficient documentation

## 2016-08-13 DIAGNOSIS — IMO0002 Reserved for concepts with insufficient information to code with codable children: Secondary | ICD-10-CM

## 2016-08-13 DIAGNOSIS — E669 Obesity, unspecified: Secondary | ICD-10-CM | POA: Insufficient documentation

## 2016-08-13 DIAGNOSIS — E1165 Type 2 diabetes mellitus with hyperglycemia: Secondary | ICD-10-CM

## 2016-08-13 DIAGNOSIS — F329 Major depressive disorder, single episode, unspecified: Secondary | ICD-10-CM

## 2016-08-13 DIAGNOSIS — M25571 Pain in right ankle and joints of right foot: Secondary | ICD-10-CM

## 2016-08-13 DIAGNOSIS — F17210 Nicotine dependence, cigarettes, uncomplicated: Secondary | ICD-10-CM | POA: Insufficient documentation

## 2016-08-13 DIAGNOSIS — F419 Anxiety disorder, unspecified: Secondary | ICD-10-CM | POA: Insufficient documentation

## 2016-08-13 DIAGNOSIS — Z7984 Long term (current) use of oral hypoglycemic drugs: Secondary | ICD-10-CM | POA: Insufficient documentation

## 2016-08-13 DIAGNOSIS — I10 Essential (primary) hypertension: Secondary | ICD-10-CM | POA: Insufficient documentation

## 2016-08-13 LAB — GLUCOSE, POCT (MANUAL RESULT ENTRY): POC Glucose: 225 mg/dl — AB (ref 70–99)

## 2016-08-13 MED ORDER — ALBUTEROL SULFATE HFA 108 (90 BASE) MCG/ACT IN AERS: 2.0000 | INHALATION_SPRAY | Freq: Four times a day (QID) | RESPIRATORY_TRACT | 0 refills | Status: DC | PRN

## 2016-08-13 MED ORDER — TRAZODONE HCL 50 MG PO TABS: 25.0000 mg | ORAL_TABLET | Freq: Every evening | ORAL | 3 refills | Status: DC | PRN

## 2016-08-13 MED ORDER — BUSPIRONE HCL 10 MG PO TABS: 10.0000 mg | ORAL_TABLET | Freq: Two times a day (BID) | ORAL | 2 refills | Status: DC

## 2016-08-13 MED ORDER — GLIMEPIRIDE 4 MG PO TABS: 4.0000 mg | ORAL_TABLET | Freq: Every day | ORAL | 3 refills | Status: DC

## 2016-08-13 MED ORDER — LOSARTAN POTASSIUM-HCTZ 100-25 MG PO TABS: 1.0000 | ORAL_TABLET | Freq: Every day | ORAL | 5 refills | Status: DC

## 2016-08-13 MED ORDER — TRAMADOL HCL 50 MG PO TABS: 50.0000 mg | ORAL_TABLET | Freq: Three times a day (TID) | ORAL | 0 refills | Status: DC | PRN

## 2016-08-13 NOTE — Patient Instructions (Addendum)
Courtney Dixon was seen today for diabetes.  Diagnoses and all orders for this visit:  Uncontrolled type 2 diabetes mellitus with diabetic polyneuropathy, without long-term current use of insulin (HCC) -     POCT glucose (manual entry) -     Amb Referral to Nutrition and Diabetic E -     glimepiride (AMARYL) 4 MG tablet; Take 1 tablet (4 mg total) by mouth daily with breakfast.  Anxiety and depression -     Ambulatory referral to Psychiatry -     busPIRone (BUSPAR) 10 MG tablet; Take 1 tablet (10 mg total) by mouth 2 (two) times daily. -     traZODone (DESYREL) 50 MG tablet; Take 0.5-1 tablets (25-50 mg total) by mouth at bedtime as needed for sleep.  Nerve pain -     traMADol (ULTRAM) 50 MG tablet; Take 1 tablet (50 mg total) by mouth every 8 (eight) hours as needed.  Smoking -     albuterol (PROVENTIL HFA;VENTOLIN HFA) 108 (90 Base) MCG/ACT inhaler; Inhale 2 puffs into the lungs every 6 (six) hours as needed for wheezing or shortness of breath.  Essential hypertension -     losartan-hydrochlorothiazide (HYZAAR) 100-25 MG tablet; Take 1 tablet by mouth daily.  Acute right ankle pain   Elastic Bandage and RICE What does an elastic bandage do? Elastic bandages come in different shapes and sizes. They generally provide support to your injury and reduce swelling while you are healing, but they can perform different functions. Your health care provider will help you to decide what is best for your protection, recovery, or rehabilitation following an injury. What are some general tips for using an elastic bandage?  Use the bandage as directed by the maker of the bandage that you are using.  Do not wrap the bandage too tightly. This may cut off the circulation in the arm or leg in the area below the bandage.  If part of your body beyond the bandage becomes blue, numb, cold, swollen, or is more painful, your bandage is most likely too tight. If this occurs, remove your bandage and reapply it  more loosely.  See your health care provider if the bandage seems to be making your problems worse rather than better.  An elastic bandage should be removed and reapplied every 3-4 hours or as directed by your health care provider. What is RICE? The routine care of many injuries includes rest, ice, compression, and elevation (RICE therapy). Rest  Rest is required to allow your body to heal. Generally, you can resume your routine activities when you are comfortable and have been given permission by your health care provider. Ice  Icing your injury helps to keep the swelling down and it reduces pain. Do not apply ice directly to your skin.  Put ice in a plastic bag.  Place a towel between your skin and the bag.  Leave the ice on for 20 minutes, 2-3 times per day. Do this for as long as you are directed by your health care provider. Compression  Compression helps to keep swelling down, gives support, and helps with discomfort. Compression may be done with an elastic bandage. Elevation  Elevation helps to reduce swelling and it decreases pain. If possible, your injured area should be placed at or above the level of your heart or the center of your chest. When should I seek medical care? You should seek medical care if:  You have persistent pain and swelling.  Your symptoms are getting worse rather  than improving. These symptoms may indicate that further evaluation or further X-rays are needed. Sometimes, X-rays may not show a small broken bone (fracture) until a number of days later. Make a follow-up appointment with your health care provider. Ask when your X-ray results will be ready. Make sure that you get your X-ray results. When should I seek immediate medical care? You should seek immediate medical care if:  You have a sudden onset of severe pain at or below the area of your injury.  You develop redness or increased swelling around your injury.  You have tingling or numbness at  or below the area of your injury that does not improve after you remove the elastic bandage. This information is not intended to replace advice given to you by your health care provider. Make sure you discuss any questions you have with your health care provider. Document Released: 11/20/2001 Document Revised: 04/26/2016 Document Reviewed: 01/14/2014 Elsevier Interactive Patient Education  2017 [place].  F/u in 6 weeks for diabetes and HTN  Dr. Adrian Blackwater

## 2016-08-13 NOTE — Progress Notes (Signed)
Subjective:  Patient ID: Courtney Dixon, female    DOB: 03-16-1966  Age: 51 y.o. MRN: [id number]  CC: Diabetes   HPI Courtney Dixon has HTN, Diabetes, chronic pain, obesity she presents for   1. Body pains: worsening over past 2 months. Cramping and pains in hands. Weakness in both hands now. Started in L hand.  Foot burning and hurt. Feels like toothache in knees and hips. Also tenderness in elbows to touch. No joint swelling or redness. Stopped Effexor went through acute withdrawals. Had swelling with voltaren. Taking gabapentin and buspar. She does not work. She does light house work.   2. Diabetes: she is taking metformin 1000 mg daily. She reports constipation with diabetes.   3. Anxiety and depression: persistent anxiety. Taking Buspar. She stopped taking Effexor after last office visit. She reports depressed mood with excessive crying at times, and other times having elated mood with excessive energy.   4. HTN: compliant with HCTZ and lisinopril. No swelling in legs. No HA, CP or SOB. Admits to anxiety.   Social History  Substance Use Topics  . Smoking status: Current Every Day Smoker    Packs/day: 0.50    Types: Cigarettes  . Smokeless tobacco: Never Used  . Alcohol use Yes    Outpatient Medications Prior to Visit  Medication Sig Dispense Refill  . atorvastatin (LIPITOR) 40 MG tablet Take 1 tablet (40 mg total) by mouth daily. 90 tablet 3  . Blood Glucose Monitoring Suppl (TRUE METRIX METER) w/Device KIT 1 each by Does not apply route as needed. 1 kit 0  . busPIRone (BUSPAR) 10 MG tablet Take 1 tablet (10 mg total) by mouth 2 (two) times daily. 60 tablet 2  . gabapentin (NEURONTIN) 300 MG capsule Take 1 capsule (300 mg total) by mouth 2 (two) times daily. 60 capsule 3  . glimepiride (AMARYL) 2 MG tablet Take 1 tablet (2 mg total) by mouth daily with breakfast. 30 tablet 5  . glucose blood (TRUE METRIX BLOOD GLUCOSE TEST) test strip 1 each by Other route 3 (three) times daily.  100 each 11  . hydrochlorothiazide (HYDRODIURIL) 25 MG tablet TAKE 1 TABLET BY MOUTH DAILY. 30 tablet 5  . lisinopril (PRINIVIL,ZESTRIL) 40 MG tablet TAKE 1 TABLET BY MOUTH DAILY. 30 tablet 5  . metFORMIN (GLUCOPHAGE-XR) 500 MG 24 hr tablet Take 2 tablets (1,000 mg total) by mouth daily after supper. 60 tablet 2  . pregabalin (LYRICA) 50 MG capsule Take 1 capsule (50 mg total) by mouth 3 (three) times daily. For PASS 270 capsule 3  . traMADol (ULTRAM) 50 MG tablet Take 1 tablet (50 mg total) by mouth every 8 (eight) hours as needed. 60 tablet 0  . TRUEPLUS LANCETS 28G MISC 1 each by Does not apply route 3 (three) times daily. 100 each 11   No facility-administered medications prior to visit.     ROS Review of Systems  Constitutional: Negative for chills and fever.  Eyes: Negative for visual disturbance.  Respiratory: Negative for shortness of breath.   Cardiovascular: Negative for chest pain.  Gastrointestinal: Negative for abdominal pain and blood in stool.  Musculoskeletal: Positive for arthralgias, back pain and myalgias.  Skin: Negative for rash.  Allergic/Immunologic: Negative for immunocompromised state.  Neurological: Positive for numbness.  Hematological: Negative for adenopathy. Does not bruise/bleed easily.  Psychiatric/Behavioral: Negative for dysphoric mood and suicidal ideas. The patient is nervous/anxious.     Objective:  BP 131/85 (BP Location: Left Arm, Patient Position: Sitting, Cuff Size:  Large)   Pulse (!) 101   Temp 98.4 F (36.9 C) (Oral)   Ht 5' 4"  (1.626 m)   Wt 226 lb (102.5 kg)   SpO2 97%   BMI 38.79 kg/m   BP/Weight 08/13/2016 07/22/2016 15/09/84  Systolic BP 761 950 932  Diastolic BP 85 94 98  Wt. (Lbs) 226 225.4 222  BMI 38.79 38.69 38.11   Physical Exam  Constitutional: She is oriented to person, place, and time. She appears well-developed and well-nourished. No distress.  Obese   HENT:  Head: Normocephalic and atraumatic.  Cardiovascular:  Normal rate, regular rhythm, normal heart sounds and intact distal pulses.   Pulmonary/Chest: Effort normal and breath sounds normal.  Musculoskeletal: She exhibits no edema.  Neurological: She is alert and oriented to person, place, and time.  Skin: Skin is warm and dry. No rash noted.  Psychiatric: Her mood appears anxious.   Lab Results  Component Value Date   HGBA1C 8.4 07/22/2016    CBG 225 GAD 7 : Generalized Anxiety Score 08/13/2016 07/22/2016 05/13/2016 03/18/2016  Nervous, Anxious, on Edge 2 1 2 1   Control/stop worrying 3 2 2 2   Worry too much - different things 3 2 2 2   Trouble relaxing 3 1 2 2   Restless 3 3 2 2   Easily annoyed or irritable 3 2 2 3   Afraid - awful might happen 2 1 1 2   Total GAD 7 Score 19 12 13 14     Depression screen [facility] 2/9 08/13/2016 07/22/2016 05/13/2016  Decreased Interest 2 2 2   Down, Depressed, Hopeless 2 2 1   PHQ - 2 Score 4 4 3   Altered sleeping 1 1 2   Tired, decreased energy 2 1 2   Change in appetite 3 0 0  Feeling bad or failure about yourself  1 2 1   Trouble concentrating 2 2 3   Moving slowly or fidgety/restless 0 1 0  Suicidal thoughts 0 0 0  PHQ-9 Score 13 11 11     Assessment & Plan:   Courtney Dixon was seen today for diabetes.  Diagnoses and all orders for this visit:  Uncontrolled type 2 diabetes mellitus with diabetic polyneuropathy, without long-term current use of insulin (HCC) -     POCT glucose (manual entry)   No orders of the defined types were placed in this encounter.   Follow-up: No Follow-up on file.   Boykin Nearing MD

## 2016-08-16 MED FILL — !VENTOLIN HFA INHALER: 108 (90 BAS | 25 days supply | Qty: 18 | Fill #0

## 2016-08-16 MED FILL — traZODone HCL 50 MG TABS: 50 | 30 days supply | Qty: 30 | Fill #0

## 2016-08-16 MED FILL — GLIMEPIRIDE 4 MG TABLET: 4 | 30 days supply | Qty: 30 | Fill #0

## 2016-08-16 MED FILL — LOSARTAN-HCTZ 100-25 MG TAB: 100-25 | 30 days supply | Qty: 30 | Fill #0

## 2016-08-25 MED FILL — traMADol HCL 50 MG TABS: 50 | 20 days supply | Qty: 60 | Fill #0

## 2016-09-06 ENCOUNTER — Other Ambulatory Visit: Payer: Self-pay | Admitting: Family Medicine

## 2016-09-06 DIAGNOSIS — F329 Major depressive disorder, single episode, unspecified: Secondary | ICD-10-CM

## 2016-09-06 DIAGNOSIS — F419 Anxiety disorder, unspecified: Principal | ICD-10-CM

## 2016-09-13 MED FILL — GLIMEPIRIDE 4 MG TABLET: 4 | 30 days supply | Qty: 30 | Fill #1

## 2016-09-13 MED FILL — LOSARTAN-HCTZ 100-25 MG TAB: 100-25 | 30 days supply | Qty: 30 | Fill #1

## 2016-09-13 MED FILL — TRUE METRIX TEST STRIP: 30 days supply | Qty: 100 | Fill #2

## 2016-09-13 MED FILL — METFORMIN HCL ER 500 MG TAB: 500 | 30 days supply | Qty: 60 | Fill #1

## 2016-09-13 MED FILL — traZODone HCL 50 MG TABS: 50 | 30 days supply | Qty: 30 | Fill #1

## 2016-09-16 MED FILL — GABAPENTIN 300 MG CAPSULE: 300 | 30 days supply | Qty: 60 | Fill #3

## 2016-09-16 MED FILL — busPIRone HCL 10 MG TABS: 10 | 30 days supply | Qty: 60 | Fill #0

## 2016-09-24 ENCOUNTER — Ambulatory Visit: Payer: Self-pay | Admitting: Family Medicine

## 2016-10-11 ENCOUNTER — Ambulatory Visit: Payer: Self-pay | Attending: Family Medicine | Admitting: Family Medicine

## 2016-10-11 ENCOUNTER — Encounter: Payer: Self-pay | Admitting: Family Medicine

## 2016-10-11 VITALS — BP 125/78 | HR 95 | Temp 98.9°F | Ht 64.0 in | Wt 234.4 lb

## 2016-10-11 DIAGNOSIS — Z79891 Long term (current) use of opiate analgesic: Secondary | ICD-10-CM | POA: Insufficient documentation

## 2016-10-11 DIAGNOSIS — IMO0002 Reserved for concepts with insufficient information to code with codable children: Secondary | ICD-10-CM

## 2016-10-11 DIAGNOSIS — F419 Anxiety disorder, unspecified: Secondary | ICD-10-CM | POA: Insufficient documentation

## 2016-10-11 DIAGNOSIS — I10 Essential (primary) hypertension: Secondary | ICD-10-CM

## 2016-10-11 DIAGNOSIS — F32A Depression, unspecified: Secondary | ICD-10-CM

## 2016-10-11 DIAGNOSIS — E669 Obesity, unspecified: Secondary | ICD-10-CM

## 2016-10-11 DIAGNOSIS — E1142 Type 2 diabetes mellitus with diabetic polyneuropathy: Secondary | ICD-10-CM | POA: Insufficient documentation

## 2016-10-11 DIAGNOSIS — F17210 Nicotine dependence, cigarettes, uncomplicated: Secondary | ICD-10-CM | POA: Insufficient documentation

## 2016-10-11 DIAGNOSIS — G894 Chronic pain syndrome: Secondary | ICD-10-CM

## 2016-10-11 DIAGNOSIS — F329 Major depressive disorder, single episode, unspecified: Secondary | ICD-10-CM | POA: Insufficient documentation

## 2016-10-11 DIAGNOSIS — Z7984 Long term (current) use of oral hypoglycemic drugs: Secondary | ICD-10-CM | POA: Insufficient documentation

## 2016-10-11 DIAGNOSIS — E1165 Type 2 diabetes mellitus with hyperglycemia: Secondary | ICD-10-CM

## 2016-10-11 DIAGNOSIS — M792 Neuralgia and neuritis, unspecified: Secondary | ICD-10-CM

## 2016-10-11 DIAGNOSIS — Z79899 Other long term (current) drug therapy: Secondary | ICD-10-CM | POA: Insufficient documentation

## 2016-10-11 LAB — GLUCOSE, POCT (MANUAL RESULT ENTRY): POC Glucose: 171 mg/dl — AB (ref 70–99)

## 2016-10-11 LAB — POCT GLYCOSYLATED HEMOGLOBIN (HGB A1C): HEMOGLOBIN A1C: 9.1

## 2016-10-11 MED ORDER — INSULIN PEN NEEDLE 31G X 8 MM MISC: 1.0000 "application " | Freq: Every day | 3 refills | Status: DC

## 2016-10-11 MED ORDER — LIRAGLUTIDE 18 MG/3ML ~~LOC~~ SOPN: PEN_INJECTOR | SUBCUTANEOUS | 2 refills | Status: DC

## 2016-10-11 MED ORDER — PREGABALIN 100 MG PO CAPS: 100.0000 mg | ORAL_CAPSULE | Freq: Three times a day (TID) | ORAL | 3 refills | Status: DC

## 2016-10-11 MED ORDER — ALPRAZOLAM 1 MG PO TABS: 0.5000 mg | ORAL_TABLET | Freq: Two times a day (BID) | ORAL | 0 refills | Status: DC | PRN

## 2016-10-11 MED ORDER — METFORMIN HCL ER 500 MG PO TB24: 1000.0000 mg | ORAL_TABLET | Freq: Every day | ORAL | 5 refills | Status: DC

## 2016-10-11 MED ORDER — GLIMEPIRIDE 4 MG PO TABS: 8.0000 mg | ORAL_TABLET | Freq: Every day | ORAL | 5 refills | Status: DC

## 2016-10-11 MED ORDER — PREGABALIN 50 MG PO CAPS: 50.0000 mg | ORAL_CAPSULE | Freq: Three times a day (TID) | ORAL | 3 refills | Status: DC

## 2016-10-11 MED ORDER — GABAPENTIN 100 MG PO CAPS: ORAL_CAPSULE | ORAL | 0 refills | Status: DC

## 2016-10-11 NOTE — Assessment & Plan Note (Signed)
A: uncontrolled diabetes in obese patient Overeating  P: Increase Amaryl to 8 mg daily Add vicotza starting at 0.6 > 1.2 mg daily to help reduce appetite and sugar

## 2016-10-11 NOTE — Progress Notes (Signed)
Subjective:  Patient ID: Courtney Dixon, female    DOB: 07-21-1965  Age: 51 y.o. MRN: [id number]  CC: Diabetes; Depression; and Anxiety   HPI Courtney Dixon has HTN, Diabetes, chronic pain, obesity she presents for   1. Body pains: worsening over past 4 Cramping and pains in hands. Weakness in both hands now. Started in L hand.  Foot burning and hurt. Feels like toothache in knees and hips. Also tenderness in elbows to touch. No joint swelling or redness. Stopped Effexor went through acute withdrawals. Had swelling with voltaren. Taking gabapentin, lyrica  and buspar. Tramadol did not help with pain. She does not work. She does light house work. She   2. Diabetes: she is taking metformin 1000 mg daily and Amaryl 4 mg daily. She denies hypoglycemia. She admits to binging at night. Eating a second dinner.   3. Anxiety and depression: persistent anxiety. Taking Buspar 10 mg BID. Reports improvement in hot flashes but still very anxious. She has tried and failed effexor and lexapro in the past. She reports depressed mood with excessive crying at times, and other times having elated mood with excessive energy. She reports anxiety at night, excessive hunger and overeating. She endorses brain fog, forgetfulness and lack of focus. She has an appointment for mental health assessment on 11/15/16.   4. HTN: compliant with hyzaar. No swelling in legs. No HA, CP or SOB. Admits to anxiety.   Social History  Substance Use Topics  . Smoking status: Current Every Day Smoker    Packs/day: 0.50    Types: Cigarettes  . Smokeless tobacco: Never Used  . Alcohol use Yes    Outpatient Medications Prior to Visit  Medication Sig Dispense Refill  . albuterol (PROVENTIL HFA;VENTOLIN HFA) 108 (90 Base) MCG/ACT inhaler Inhale 2 puffs into the lungs every 6 (six) hours as needed for wheezing or shortness of breath. 1 Inhaler 0  . atorvastatin (LIPITOR) 40 MG tablet Take 1 tablet (40 mg total) by mouth daily. 90 tablet 3   . Blood Glucose Monitoring Suppl (TRUE METRIX METER) w/Device KIT 1 each by Does not apply route as needed. 1 kit 0  . busPIRone (BUSPAR) 10 MG tablet Take 1 tablet (10 mg total) by mouth 2 (two) times daily. 60 tablet 2  . gabapentin (NEURONTIN) 300 MG capsule Take 1 capsule (300 mg total) by mouth 2 (two) times daily. 60 capsule 3  . glimepiride (AMARYL) 4 MG tablet Take 1 tablet (4 mg total) by mouth daily with breakfast. 30 tablet 3  . glucose blood (TRUE METRIX BLOOD GLUCOSE TEST) test strip 1 each by Other route 3 (three) times daily. 100 each 11  . losartan-hydrochlorothiazide (HYZAAR) 100-25 MG tablet Take 1 tablet by mouth daily. 30 tablet 5  . metFORMIN (GLUCOPHAGE-XR) 500 MG 24 hr tablet Take 2 tablets (1,000 mg total) by mouth daily after supper. 60 tablet 2  . pregabalin (LYRICA) 50 MG capsule Take 1 capsule (50 mg total) by mouth 3 (three) times daily. For PASS 270 capsule 3  . traMADol (ULTRAM) 50 MG tablet Take 1 tablet (50 mg total) by mouth every 8 (eight) hours as needed. 60 tablet 0  . traZODone (DESYREL) 50 MG tablet Take 0.5-1 tablets (25-50 mg total) by mouth at bedtime as needed for sleep. 30 tablet 3  . TRUEPLUS LANCETS 28G MISC 1 each by Does not apply route 3 (three) times daily. 100 each 11   No facility-administered medications prior to visit.  ROS Review of Systems  Constitutional: Negative for chills and fever.  Eyes: Negative for visual disturbance.  Respiratory: Negative for shortness of breath.   Cardiovascular: Negative for chest pain.  Gastrointestinal: Negative for abdominal pain and blood in stool.  Musculoskeletal: Positive for arthralgias, back pain and myalgias.  Skin: Negative for rash.  Allergic/Immunologic: Negative for immunocompromised state.  Neurological: Positive for numbness.  Hematological: Negative for adenopathy. Does not bruise/bleed easily.  Psychiatric/Behavioral: Negative for dysphoric mood and suicidal ideas. The patient is  nervous/anxious.     Objective:  BP 125/78   Pulse 95   Temp 98.9 F (37.2 C) (Oral)   Ht 5' 4"  (1.626 m)   Wt 234 lb 6.4 oz (106.3 kg)   SpO2 95%   BMI 40.23 kg/m   BP/Weight 10/11/2016 02/14/7341 01/18/6810  Systolic BP 572 620 355  Diastolic BP 78 85 94  Wt. (Lbs) 234.4 226 225.4  BMI 40.23 38.79 38.69   Physical Exam  Constitutional: She is oriented to person, place, and time. She appears well-developed and well-nourished. No distress.  Obese   HENT:  Head: Normocephalic and atraumatic.  Cardiovascular: Normal rate, regular rhythm, normal heart sounds and intact distal pulses.   Pulmonary/Chest: Effort normal and breath sounds normal.  Musculoskeletal: She exhibits no edema.  Neurological: She is alert and oriented to person, place, and time.  Skin: Skin is warm and dry. No rash noted.  Psychiatric: Her mood appears anxious.   Lab Results  Component Value Date   HGBA1C 8.4 07/22/2016   Lab Results  Component Value Date   HGBA1C 9.1 10/11/2016    CBG 171 GAD 7 : Generalized Anxiety Score 10/11/2016 08/13/2016 07/22/2016 05/13/2016  Nervous, Anxious, on Edge 2 2 1 2   Control/stop worrying 3 3 2 2   Worry too much - different things 3 3 2 2   Trouble relaxing 3 3 1 2   Restless 2 3 3 2   Easily annoyed or irritable 3 3 2 2   Afraid - awful might happen 2 2 1 1   Total GAD 7 Score 18 19 12 13     Depression screen [facility] 2/9 10/11/2016 08/13/2016 07/22/2016  Decreased Interest 3 2 2   Down, Depressed, Hopeless 2 2 2   PHQ - 2 Score 5 4 4   Altered sleeping 1 1 1   Tired, decreased energy 2 2 1   Change in appetite 3 3 0  Feeling bad or failure about yourself  2 1 2   Trouble concentrating 2 2 2   Moving slowly or fidgety/restless 3 0 1  Suicidal thoughts 1 0 0  PHQ-9 Score 19 13 11     Assessment & Plan:   Courtney Dixon was seen today for diabetes, depression and anxiety.  Diagnoses and all orders for this visit:  Uncontrolled type 2 diabetes mellitus with diabetic polyneuropathy,  without long-term current use of insulin (HCC) -     POCT glucose (manual entry) -     POCT glycosylated hemoglobin (Hb A1C) -     glimepiride (AMARYL) 4 MG tablet; Take 2 tablets (8 mg total) by mouth daily with breakfast. -     metFORMIN (GLUCOPHAGE-XR) 500 MG 24 hr tablet; Take 2 tablets (1,000 mg total) by mouth daily after supper. -     liraglutide (VICTOZA) 18 MG/3ML SOPN; Inject into skin  0.6 mg once daily for 1 week; then increase to 1.2 mg once daily -     Insulin Pen Needle (B-D ULTRAFINE III SHORT PEN) 31G X 8 MM MISC; 1 application  by Does not apply route daily.  Nerve pain -     Discontinue: pregabalin (LYRICA) 50 MG capsule; Take 1 capsule (50 mg total) by mouth 3 (three) times daily. For PASS -     gabapentin (NEURONTIN) 100 MG capsule; Taper by to 200 mg twice daily for for 4 days, then 100 mg twice daily for 3 days, then 100 mg in evening for 3 days then stop -     pregabalin (LYRICA) 100 MG capsule; Take 1 capsule (100 mg total) by mouth 3 (three) times daily. For PASS dose change  Anxiety and depression -     Discontinue: ALPRAZolam (XANAX) 1 MG tablet; Take 0.5-1 tablets (0.5-1 mg total) by mouth 2 (two) times daily as needed for anxiety. -     ALPRAZolam (XANAX) 1 MG tablet; Take 0.5-1 tablets (0.5-1 mg total) by mouth 2 (two) times daily as needed for anxiety.   No orders of the defined types were placed in this encounter.   Follow-up: Return in about 6 weeks (around 11/22/2016) for diabetes and neuropathic pain.   Boykin Nearing MD

## 2016-10-11 NOTE — Patient Instructions (Addendum)
Courtney Dixon was seen today for diabetes, depression and anxiety.  Diagnoses and all orders for this visit:  Uncontrolled type 2 diabetes mellitus with diabetic polyneuropathy, without long-term current use of insulin (HCC) -     POCT glucose (manual entry) -     POCT glycosylated hemoglobin (Hb A1C) -     glimepiride (AMARYL) 4 MG tablet; Take 2 tablets (8 mg total) by mouth daily with breakfast. -     metFORMIN (GLUCOPHAGE-XR) 500 MG 24 hr tablet; Take 2 tablets (1,000 mg total) by mouth daily after supper. -     liraglutide (VICTOZA) 18 MG/3ML SOPN; Inject into skin  0.6 mg once daily for 1 week; then increase to 1.2 mg once daily  Nerve pain -     Discontinue: pregabalin (LYRICA) 50 MG capsule; Take 1 capsule (50 mg total) by mouth 3 (three) times daily. For PASS -     gabapentin (NEURONTIN) 100 MG capsule; Taper by to 200 mg twice daily for for 4 days, then 100 mg twice daily for 3 days, then 100 mg in evening for 3 days then stop -     pregabalin (LYRICA) 100 MG capsule; Take 1 capsule (100 mg total) by mouth 3 (three) times daily. For PASS dose change  Anxiety and depression -     ALPRAZolam (XANAX) 1 MG tablet; Take 0.5-1 tablets (0.5-1 mg total) by mouth 2 (two) times daily as needed for anxiety.  Diabetes blood sugar goals  Fasting (in AM before breakfast, 8 hrs of no eating or drinking (except water or unsweetened coffee or tea): 90-130 2 hrs after meals: < 160,   No low sugars: nothing < 70   Smoking cessation support: smoking cessation hotline: 1-800-QUIT-NOW.  Smoking cessation classes are available through [facility]. Call [phone] or visit our website at [email or web address]   f/u in 6 weeks for diabetes and neuropathic pain   Dr. Adrian Blackwater

## 2016-10-11 NOTE — Assessment & Plan Note (Signed)
victoza

## 2016-10-11 NOTE — Assessment & Plan Note (Signed)
Diabetic neuropathy, diabetic myalgia, possible fibromyalgia Increase lyrica to 100 mg TID Taper down gabapentin to none

## 2016-10-11 NOTE — Assessment & Plan Note (Signed)
A: chronic with labile mood concerning for possible bipolar depression P: Continue buspar 10 mg BID Continue trazodone 50 mg nightly  Add xanax 0.5-1 mg TID prn, 30 pills, no refills Mental health assessment as I suspect patient will benefit from mood stabilizer

## 2016-10-11 NOTE — Assessment & Plan Note (Signed)
Well controlled Continue hyzaar

## 2016-10-12 MED FILL — !VICTOZA 18MG/3ML INJECT: 18 | 9 days supply | Qty: 1 | Fill #0

## 2016-10-13 MED FILL — METFORMIN HCL ER 500 MG TAB: 500 | 30 days supply | Qty: 60 | Fill #0

## 2016-10-13 MED FILL — BD PEN NDL MINI 31GX5MM: 31G X 5 MM | 30 days supply | Qty: 90 | Fill #0

## 2016-10-13 MED FILL — traZODone HCL 50 MG TABS: 50 | 30 days supply | Qty: 30 | Fill #2

## 2016-10-13 MED FILL — LOSARTAN-HCTZ 100-25 MG TAB: 100-25 | 30 days supply | Qty: 30 | Fill #2

## 2016-10-13 MED FILL — busPIRone HCL 10 MG TABS: 10 | 30 days supply | Qty: 60 | Fill #1

## 2016-10-13 MED FILL — ?GLIMEPIRIDE 4 MG TABLET: 4 | 3 days supply | Qty: 60 | Fill #0

## 2016-10-13 MED FILL — GABAPENTIN 100 MG CAPSULE: 100 | 10 days supply | Qty: 25 | Fill #0

## 2016-10-27 ENCOUNTER — Encounter: Payer: Self-pay | Admitting: Family Medicine

## 2016-11-09 MED FILL — !VICTOZA 18MG/3ML INJECT: 18 | 15 days supply | Qty: 3 | Fill #1

## 2016-11-15 MED FILL — LOSARTAN-HCTZ 100-25 MG TAB: 100-25 | 30 days supply | Qty: 30 | Fill #3

## 2016-11-15 MED FILL — ?GLIMEPIRIDE 4 MG TABLET: 4 | 3 days supply | Qty: 60 | Fill #1

## 2016-11-15 MED FILL — ?TRAZODONE 50 MG TABLET: 50 | 30 days supply | Qty: 30 | Fill #3

## 2016-11-15 MED FILL — METFORMIN HCL ER 500 MG TAB: 500 | 30 days supply | Qty: 60 | Fill #1

## 2016-11-16 ENCOUNTER — Encounter (HOSPITAL_COMMUNITY): Payer: Self-pay | Admitting: Psychiatry

## 2016-11-16 ENCOUNTER — Ambulatory Visit (INDEPENDENT_AMBULATORY_CARE_PROVIDER_SITE_OTHER): Payer: Self-pay | Admitting: Psychiatry

## 2016-11-16 VITALS — BP 142/80 | HR 94 | Ht 64.0 in | Wt 234.8 lb

## 2016-11-16 DIAGNOSIS — F331 Major depressive disorder, recurrent, moderate: Secondary | ICD-10-CM

## 2016-11-16 DIAGNOSIS — Z813 Family history of other psychoactive substance abuse and dependence: Secondary | ICD-10-CM

## 2016-11-16 DIAGNOSIS — F4312 Post-traumatic stress disorder, chronic: Secondary | ICD-10-CM

## 2016-11-16 DIAGNOSIS — F17210 Nicotine dependence, cigarettes, uncomplicated: Secondary | ICD-10-CM

## 2016-11-16 MED ORDER — LAMOTRIGINE 25 MG PO TABS: ORAL_TABLET | ORAL | 1 refills | Status: DC

## 2016-11-16 MED ORDER — PRAZOSIN HCL 2 MG PO CAPS: 2.0000 mg | ORAL_CAPSULE | Freq: Every day | ORAL | 0 refills | Status: DC

## 2016-11-16 MED FILL — lamoTRIgine 25 MG TABS: 25 | 30 days supply | Qty: 90 | Fill #0

## 2016-11-16 MED FILL — PRAZOSIN 2 MG CAPSULE: 2 | 30 days supply | Qty: 30 | Fill #0
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
## 2016-11-17 ENCOUNTER — Other Ambulatory Visit: Payer: Self-pay

## 2016-11-17 DIAGNOSIS — M792 Neuralgia and neuritis, unspecified: Secondary | ICD-10-CM

## 2016-11-17 MED ORDER — PREGABALIN 100 MG PO CAPS: 100.0000 mg | ORAL_CAPSULE | Freq: Three times a day (TID) | ORAL | 3 refills | Status: DC

## 2016-11-18 MED FILL — TRUE METRIX TEST STRIP: 33 days supply | Qty: 100 | Fill #3

## 2016-12-08 MED FILL — !VICTOZA 18MG/3ML INJECT: 18 | 15 days supply | Qty: 3 | Fill #2

## 2016-12-13 ENCOUNTER — Other Ambulatory Visit: Payer: Self-pay | Admitting: Family Medicine

## 2016-12-13 DIAGNOSIS — F329 Major depressive disorder, single episode, unspecified: Secondary | ICD-10-CM

## 2016-12-13 DIAGNOSIS — F419 Anxiety disorder, unspecified: Principal | ICD-10-CM

## 2016-12-13 MED FILL — GLIMEPIRIDE 4 MG TABLET: 4 | 3 days supply | Qty: 60 | Fill #2

## 2016-12-13 MED FILL — LOSARTAN-HCTZ 100-25 MG TAB: 100-25 | 30 days supply | Qty: 30 | Fill #4

## 2016-12-14 MED FILL — lamoTRIgine 25 MG TABS: 25 | 22 days supply | Qty: 90 | Fill #1

## 2016-12-17 ENCOUNTER — Other Ambulatory Visit: Payer: Self-pay | Admitting: Family Medicine

## 2016-12-17 DIAGNOSIS — F329 Major depressive disorder, single episode, unspecified: Secondary | ICD-10-CM

## 2016-12-17 DIAGNOSIS — F419 Anxiety disorder, unspecified: Principal | ICD-10-CM

## 2016-12-17 DIAGNOSIS — F32A Depression, unspecified: Secondary | ICD-10-CM

## 2016-12-22 ENCOUNTER — Other Ambulatory Visit: Payer: Self-pay

## 2016-12-22 DIAGNOSIS — E1165 Type 2 diabetes mellitus with hyperglycemia: Principal | ICD-10-CM

## 2016-12-22 DIAGNOSIS — E1142 Type 2 diabetes mellitus with diabetic polyneuropathy: Secondary | ICD-10-CM

## 2016-12-22 DIAGNOSIS — IMO0002 Reserved for concepts with insufficient information to code with codable children: Secondary | ICD-10-CM

## 2016-12-22 MED ORDER — LIRAGLUTIDE 18 MG/3ML ~~LOC~~ SOPN: PEN_INJECTOR | SUBCUTANEOUS | 3 refills | Status: DC

## 2017-01-03 ENCOUNTER — Other Ambulatory Visit: Payer: Self-pay | Admitting: Family Medicine

## 2017-01-03 DIAGNOSIS — IMO0002 Reserved for concepts with insufficient information to code with codable children: Secondary | ICD-10-CM

## 2017-01-03 DIAGNOSIS — E1165 Type 2 diabetes mellitus with hyperglycemia: Principal | ICD-10-CM

## 2017-01-03 DIAGNOSIS — E1142 Type 2 diabetes mellitus with diabetic polyneuropathy: Secondary | ICD-10-CM

## 2017-01-03 MED ORDER — LIRAGLUTIDE 18 MG/3ML ~~LOC~~ SOPN: PEN_INJECTOR | SUBCUTANEOUS | 3 refills | Status: DC

## 2017-01-04 ENCOUNTER — Ambulatory Visit (INDEPENDENT_AMBULATORY_CARE_PROVIDER_SITE_OTHER): Payer: No Typology Code available for payment source | Admitting: Psychiatry

## 2017-01-04 ENCOUNTER — Encounter (HOSPITAL_COMMUNITY): Payer: Self-pay | Admitting: Psychiatry

## 2017-01-04 VITALS — BP 124/86 | HR 89 | Ht 64.0 in | Wt 235.0 lb

## 2017-01-04 DIAGNOSIS — Z813 Family history of other psychoactive substance abuse and dependence: Secondary | ICD-10-CM

## 2017-01-04 DIAGNOSIS — F331 Major depressive disorder, recurrent, moderate: Secondary | ICD-10-CM

## 2017-01-04 DIAGNOSIS — F17210 Nicotine dependence, cigarettes, uncomplicated: Secondary | ICD-10-CM

## 2017-01-04 DIAGNOSIS — F4312 Post-traumatic stress disorder, chronic: Secondary | ICD-10-CM

## 2017-01-04 MED ORDER — BUPROPION HCL ER (XL) 150 MG PO TB24: 150.0000 mg | ORAL_TABLET | ORAL | 0 refills | Status: DC

## 2017-01-04 MED ORDER — TRAZODONE HCL 100 MG PO TABS: 200.0000 mg | ORAL_TABLET | Freq: Every day | ORAL | 1 refills | Status: DC

## 2017-01-04 MED ORDER — LAMOTRIGINE 200 MG PO TABS: 200.0000 mg | ORAL_TABLET | Freq: Every day | ORAL | 1 refills | Status: DC

## 2017-01-04 MED FILL — !VICTOZA 18MG/3ML INJECT: 18 | 30 days supply | Qty: 6 | Fill #0

## 2017-01-04 MED FILL — ?BUPROPION HCL XL 150 MG TA: 150 | 30 days supply | Qty: 30 | Fill #0

## 2017-01-04 MED FILL — lamoTRIgine 200 MG TABS: 200 | 30 days supply | Qty: 30 | Fill #0

## 2017-01-04 MED FILL — traZODone HCL 100 MG TABS: 100 | 30 days supply | Qty: 60 | Fill #0

## 2017-01-04 NOTE — Progress Notes (Signed)
BH MD/PA/NP OP Progress Note  01/04/2017 4:21 PM Courtney Dixon  MRN:  [id number]  Chief Complaint:  Chief Complaint    Follow-up     Subjective:  Courtney Dixon presents for med management follow-up. She reports that Lamictal has been tolerated without any issue, but she has not yet noticed any benefit.  She continues to struggle with anxiety and racing thoughts.  She is opposed to anything that would cause her to have weight gain, as she admits that she tends to binge eat when she is anxious. She has not had any skin rash or intolerance. We agreed increase to 200 mg daily. She reports that she has continued to have trouble sleeping so she has been using trazodone which she was previously prescribed. The prazosin was not effective, so she would like to restart trazodone 200 mg nightly.  We discussed adding Wellbutrin 150 mg XL to help with some of the appetite increase and binge eating that she seems to struggle with, and addition to augmenting some of her low energy and depressed mood.  She continues to take care of her cousin who is living with her. She reports that they have a good relationship and this tends to be a source of positive support in her life. She reports that her boyfriend continues to be a good support. We spent time discussing therapy, but she is anxious at the prospects of starting therapy so would like to hold off for now.  She denies any acute safety issues and agrees to follow-up in 3 months.  Visit Diagnosis:    ICD-10-CM   1. Chronic post-traumatic stress disorder (PTSD) F43.12 traZODone (DESYREL) 100 MG tablet    lamoTRIgine (LAMICTAL) 200 MG tablet  2. Moderate episode of recurrent major depressive disorder (HCC) F33.1 traZODone (DESYREL) 100 MG tablet    Past Psychiatric History: See intake H&P for full details. Reviewed, with no updates at this time.  Past Medical History:  Past Medical History:  Diagnosis Date  . Anxiety   . Depression   . Diabetes mellitus,  type II ([place])   . Hypertension   . Migraines   . Neuropathy     Past Surgical History:  Procedure Laterality Date  . CESAREAN SECTION    . TUBAL LIGATION      Family Psychiatric History: See intake H&P for full details. Reviewed, with no updates at this time.   Family History:  Family History  Problem Relation Age of Onset  . Cancer Mother   . Drug abuse Mother   . Heart disease Mother   . Hypertension Mother   . Diabetes Mother   . Cancer Father   . Diabetes Sister   . Diabetes Sister     Social History:  Social History   Social History  . Marital status: Married    Spouse name: N/A  . Number of children: N/A  . Years of education: N/A   Social History Main Topics  . Smoking status: Current Every Day Smoker    Packs/day: 1.00    Years: 36.00    Types: Cigarettes  . Smokeless tobacco: Never Used  . Alcohol use No     Comment: Rarely uses   . Drug use: No  . Sexual activity: Yes    Partners: Male    Birth control/ protection: Surgical   Other Topics Concern  . None   Social History Narrative  . None    Allergies:  Allergies  Allergen Reactions  . Sulfa Antibiotics Itching  Metabolic Disorder Labs: Lab Results  Component Value Date   HGBA1C 9.1 10/11/2016   MPG 137 (H) 05/03/2014   MPG 137 (H) 11/07/2012   No results found for: PROLACTIN Lab Results  Component Value Date   CHOL 231 (H) 10/21/2015   TRIG 289 (H) 10/21/2015   HDL 49 10/21/2015   CHOLHDL 4.7 10/21/2015   VLDL 58 (H) 10/21/2015   LDLCALC 124 10/21/2015   LDLCALC 172 (H) 10/15/2013     Current Medications: Current Outpatient Prescriptions  Medication Sig Dispense Refill  . albuterol (PROVENTIL HFA;VENTOLIN HFA) 108 (90 Base) MCG/ACT inhaler Inhale 2 puffs into the lungs every 6 (six) hours as needed for wheezing or shortness of breath. 1 Inhaler 0  . Blood Glucose Monitoring Suppl (TRUE METRIX METER) w/Device KIT 1 each by Does not apply route as needed. 1 kit 0  .  glimepiride (AMARYL) 4 MG tablet Take 2 tablets (8 mg total) by mouth daily with breakfast. 60 tablet 5  . glucose blood (TRUE METRIX BLOOD GLUCOSE TEST) test strip 1 each by Other route 3 (three) times daily. 100 each 11  . ibuprofen (ADVIL,MOTRIN) 200 MG tablet Take 600 mg by mouth 2 (two) times daily.    . Insulin Pen Needle (B-D ULTRAFINE III SHORT PEN) 31G X 8 MM MISC 1 application by Does not apply route daily. 90 each 3  . lamoTRIgine (LAMICTAL) 200 MG tablet Take 1 tablet (200 mg total) by mouth daily. 90 tablet 1  . liraglutide (VICTOZA) 18 MG/3ML SOPN Inject into skin  0.6 mg once daily for 1 week; then increase to 1.2 mg once daily 6 pen 3  . losartan-hydrochlorothiazide (HYZAAR) 100-25 MG tablet Take 1 tablet by mouth daily. 30 tablet 5  . metFORMIN (GLUCOPHAGE-XR) 500 MG 24 hr tablet Take 2 tablets (1,000 mg total) by mouth daily after supper. 60 tablet 5  . pregabalin (LYRICA) 100 MG capsule Take 1 capsule (100 mg total) by mouth 3 (three) times daily. For PASS dose change 270 capsule 3  . TRUEPLUS LANCETS 28G MISC 1 each by Does not apply route 3 (three) times daily. 100 each 11  . atorvastatin (LIPITOR) 40 MG tablet Take 1 tablet (40 mg total) by mouth daily. (Patient not taking: Reported on 01/04/2017) 90 tablet 3  . buPROPion (WELLBUTRIN XL) 150 MG 24 hr tablet Take 1 tablet (150 mg total) by mouth every morning. 90 tablet 0  . traZODone (DESYREL) 100 MG tablet Take 2 tablets (200 mg total) by mouth at bedtime. 180 tablet 1   No current facility-administered medications for this visit.     Neurologic: Headache: Negative Seizure: Negative Paresthesias: Negative  Musculoskeletal: Strength & Muscle Tone: within normal limits Gait & Station: normal Patient leans: N/A  Psychiatric Specialty Exam: ROS  Blood pressure 124/86, pulse 89, height 5' 4"  (1.626 m), weight 235 lb (106.6 kg), SpO2 95 %.Body mass index is 40.34 kg/m.  General Appearance: Casual and Fairly Groomed   Eye Contact:  Good  Speech:  Clear and Coherent  Volume:  Normal  Mood:  Anxious and Depressed  Affect:  Appropriate and Congruent  Thought Process:  Goal Directed  Orientation:  Full (Time, Place, and Person)  Thought Content: Logical   Suicidal Thoughts:  No  Homicidal Thoughts:  No  Memory:  Immediate;   Fair  Judgement:  Fair  Insight:  Good and Fair  Psychomotor Activity:  Normal  Concentration:  Concentration: Fair  Recall:  [employer] of [place]:  Good  Language: Good  Akathisia:  Negative  Handed:  Right  AIMS (if indicated):  0  Assets:  Communication Skills Desire for Improvement Housing Social Support Transportation  ADL's:  Intact  Cognition: WNL  Sleep:  5-8 hours    Treatment Plan Summary: Courtney Dixon is a 51 year old female with chronic PTSD, who presents today for med management follow-up. We are titrating her medications as below to affect, to target racing thoughts and poor frustration tolerance. She is previously been tried on multiple SSRIs without benefit. We will also initiate a low-dose of Wellbutrin to help reduce some of her binge eating symptoms.  1. Chronic post-traumatic stress disorder (PTSD)   2. Moderate episode of recurrent major depressive disorder (HCC)    Increase Lamictal to 200 mg daily Trazodone 200 mg nightly for sleep Initiate Wellbutrin 150 mg XL daily Return to clinic in 3 months  Aundra Dubin, MD 01/04/2017, 4:21 PM

## 2017-01-18 MED FILL — LOSARTAN-HCTZ 100-25 MG TAB: 100-25 | 30 days supply | Qty: 30 | Fill #5

## 2017-01-18 MED FILL — ?GLIMEPIRIDE 4 MG TABLET: 4 | 30 days supply | Qty: 60 | Fill #3

## 2017-01-18 MED FILL — TRUE METRIX TEST STRIP: 33 days supply | Qty: 100 | Fill #4

## 2017-01-31 ENCOUNTER — Ambulatory Visit: Payer: Self-pay | Admitting: Internal Medicine

## 2017-02-14 ENCOUNTER — Other Ambulatory Visit: Payer: Self-pay | Admitting: Family Medicine

## 2017-02-14 DIAGNOSIS — I10 Essential (primary) hypertension: Secondary | ICD-10-CM

## 2017-02-15 MED FILL — traZODone HCL 100 MG TABS: 100 | 30 days supply | Qty: 60 | Fill #1

## 2017-02-15 MED FILL — ?GLIMEPIRIDE 4 MG TABLET: 4 | 30 days supply | Qty: 60 | Fill #4

## 2017-02-15 MED FILL — LOSARTAN-HCTZ 100-25 MG TAB: 100-25 | 30 days supply | Qty: 30 | Fill #0

## 2017-02-15 MED FILL — lamoTRIgine 200 MG TABS: 200 | 30 days supply | Qty: 30 | Fill #1

## 2017-03-15 ENCOUNTER — Telehealth: Payer: Self-pay | Admitting: Family Medicine

## 2017-03-15 DIAGNOSIS — I10 Essential (primary) hypertension: Secondary | ICD-10-CM

## 2017-03-15 MED ORDER — LOSARTAN POTASSIUM-HCTZ 100-25 MG PO TABS: 1.0000 | ORAL_TABLET | Freq: Every day | ORAL | 0 refills | Status: DC

## 2017-03-15 MED FILL — LOSARTAN-HCTZ 100-25 MG TAB: 100-25 | 30 days supply | Qty: 30 | Fill #0

## 2017-03-15 NOTE — Telephone Encounter (Signed)
Pt need a refill for losartan-hydrochlorothiazide (HYZAAR) 100-25 MG tablet  She has an appt for 04/01/17 she need at least med until that day, please follow

## 2017-03-15 NOTE — Telephone Encounter (Signed)
Refilled

## 2017-03-16 ENCOUNTER — Other Ambulatory Visit: Payer: Self-pay | Admitting: Internal Medicine

## 2017-03-16 DIAGNOSIS — I10 Essential (primary) hypertension: Secondary | ICD-10-CM

## 2017-03-16 MED FILL — traZODone HCL 100 MG TABS: 100 | 30 days supply | Qty: 60 | Fill #2

## 2017-03-16 MED FILL — ?BUPROPION HCL XL 150 MG TA: 150 | 30 days supply | Qty: 30 | Fill #1

## 2017-03-16 MED FILL — lamoTRIgine 200 MG TABS: 200 | 30 days supply | Qty: 30 | Fill #2

## 2017-03-16 MED FILL — ?GLIMEPIRIDE 4 MG TABLET: 4 | 30 days supply | Qty: 60 | Fill #5

## 2017-03-16 MED FILL — TRUE METRIX TEST STRIP: 33 days supply | Qty: 100 | Fill #5

## 2017-03-31 ENCOUNTER — Ambulatory Visit (HOSPITAL_COMMUNITY): Payer: No Typology Code available for payment source | Admitting: Psychiatry

## 2017-04-01 ENCOUNTER — Encounter: Payer: Self-pay | Admitting: Family Medicine

## 2017-04-01 ENCOUNTER — Ambulatory Visit: Payer: Self-pay | Attending: Family Medicine | Admitting: Family Medicine

## 2017-04-01 VITALS — BP 138/88 | HR 97 | Temp 98.0°F | Ht 64.0 in | Wt 233.6 lb

## 2017-04-01 DIAGNOSIS — F4310 Post-traumatic stress disorder, unspecified: Secondary | ICD-10-CM | POA: Insufficient documentation

## 2017-04-01 DIAGNOSIS — Z794 Long term (current) use of insulin: Secondary | ICD-10-CM | POA: Insufficient documentation

## 2017-04-01 DIAGNOSIS — E7800 Pure hypercholesterolemia, unspecified: Secondary | ICD-10-CM | POA: Insufficient documentation

## 2017-04-01 DIAGNOSIS — F419 Anxiety disorder, unspecified: Secondary | ICD-10-CM

## 2017-04-01 DIAGNOSIS — M25552 Pain in left hip: Secondary | ICD-10-CM | POA: Insufficient documentation

## 2017-04-01 DIAGNOSIS — IMO0002 Reserved for concepts with insufficient information to code with codable children: Secondary | ICD-10-CM

## 2017-04-01 DIAGNOSIS — Z79899 Other long term (current) drug therapy: Secondary | ICD-10-CM | POA: Insufficient documentation

## 2017-04-01 DIAGNOSIS — G43909 Migraine, unspecified, not intractable, without status migrainosus: Secondary | ICD-10-CM | POA: Insufficient documentation

## 2017-04-01 DIAGNOSIS — E1142 Type 2 diabetes mellitus with diabetic polyneuropathy: Secondary | ICD-10-CM | POA: Insufficient documentation

## 2017-04-01 DIAGNOSIS — E1165 Type 2 diabetes mellitus with hyperglycemia: Secondary | ICD-10-CM | POA: Insufficient documentation

## 2017-04-01 DIAGNOSIS — Z9851 Tubal ligation status: Secondary | ICD-10-CM | POA: Insufficient documentation

## 2017-04-01 DIAGNOSIS — E119 Type 2 diabetes mellitus without complications: Secondary | ICD-10-CM

## 2017-04-01 DIAGNOSIS — I10 Essential (primary) hypertension: Secondary | ICD-10-CM | POA: Insufficient documentation

## 2017-04-01 DIAGNOSIS — F329 Major depressive disorder, single episode, unspecified: Secondary | ICD-10-CM | POA: Insufficient documentation

## 2017-04-01 DIAGNOSIS — Z882 Allergy status to sulfonamides status: Secondary | ICD-10-CM | POA: Insufficient documentation

## 2017-04-01 DIAGNOSIS — Z9889 Other specified postprocedural states: Secondary | ICD-10-CM | POA: Insufficient documentation

## 2017-04-01 LAB — GLUCOSE, POCT (MANUAL RESULT ENTRY): POC GLUCOSE: 153 mg/dL — AB (ref 70–99)

## 2017-04-01 LAB — POCT GLYCOSYLATED HEMOGLOBIN (HGB A1C): HEMOGLOBIN A1C: 8.4

## 2017-04-01 MED ORDER — GLIMEPIRIDE 4 MG PO TABS: 8.0000 mg | ORAL_TABLET | Freq: Every day | ORAL | 5 refills | Status: DC

## 2017-04-01 MED ORDER — HYDROXYZINE HCL 25 MG PO TABS: 25.0000 mg | ORAL_TABLET | Freq: Three times a day (TID) | ORAL | 0 refills | Status: DC | PRN

## 2017-04-01 MED ORDER — METFORMIN HCL ER 500 MG PO TB24: 1000.0000 mg | ORAL_TABLET | Freq: Two times a day (BID) | ORAL | 5 refills | Status: DC

## 2017-04-01 MED ORDER — ATORVASTATIN CALCIUM 40 MG PO TABS: 40.0000 mg | ORAL_TABLET | Freq: Every day | ORAL | 5 refills | Status: DC

## 2017-04-01 MED ORDER — LOSARTAN POTASSIUM-HCTZ 100-25 MG PO TABS: 1.0000 | ORAL_TABLET | Freq: Every day | ORAL | 5 refills | Status: DC

## 2017-04-01 MED FILL — METFORMIN HCL ER 500 MG TAB: 500 | 30 days supply | Qty: 120 | Fill #0

## 2017-04-01 MED FILL — ?ATORVASTATIN 40MG TABLET: 40 | 30 days supply | Qty: 30 | Fill #0

## 2017-04-01 MED FILL — hydrOXYzine HCL 25 MG TABS: 25 | 20 days supply | Qty: 60 | Fill #0

## 2017-04-01 NOTE — Progress Notes (Signed)
Subjective:  Patient ID: Courtney Dixon, female    DOB: 1965-07-07  Age: 51 y.o. MRN: [id number]  CC: Diabetes  HPI Courtney Dixon is a 51 year old female with a history of type 2 diabetes mellitus (A1c 8.4), diabetic neuropathy, hypertension, anxiety and depression, PTSD, hyperlipidemia who presents today to get established with me. She was previously followed by Dr. Adrian Blackwater.  Today she complains of anxiety which she describes as chest heaviness but informs me her anxiety usually feels this way and she is unable to see her psychiatrist until next month. Her last visit with her psychiatrist Dr.Eksir was on 12/2016 and she remains on trazodone, Lamictal and Wellbutrin. She is unable to identify any precipitating factors but denies suicidal ideation or intents  For her diabetes she has been taking metformin and glimepiride but has been unable to tolerate Victoza due to  reflux and so she discontinued it. For diabetic neuropathy she takes gabapentin but does experience some numbness in her extremities and is wondering if this is responsible for her memory loss which she has noticed in the last 5 months but then is unable to state how she arrived at [facility]. He tried gabapentin in the past with no relief of neuropathy.  During our discussion she is tangential and jumps from one topic to the next; I have advised her to discuss her memory issues with her psychiatrist as well as there could be some underlying psychological disorder responsible for this.  Past Medical History:  Diagnosis Date  . Anxiety   . Depression   . Diabetes mellitus, type II ([place])   . Hypertension   . Migraines   . Neuropathy     Past Surgical History:  Procedure Laterality Date  . CESAREAN SECTION    . TUBAL LIGATION      Allergies  Allergen Reactions  . Sulfa Antibiotics Itching     Outpatient Medications Prior to Visit  Medication Sig Dispense Refill  . albuterol (PROVENTIL HFA;VENTOLIN HFA) 108 (90 Base)  MCG/ACT inhaler Inhale 2 puffs into the lungs every 6 (six) hours as needed for wheezing or shortness of breath. 1 Inhaler 0  . Blood Glucose Monitoring Suppl (TRUE METRIX METER) w/Device KIT 1 each by Does not apply route as needed. 1 kit 0  . buPROPion (WELLBUTRIN XL) 150 MG 24 hr tablet Take 1 tablet (150 mg total) by mouth every morning. 90 tablet 0  . glucose blood (TRUE METRIX BLOOD GLUCOSE TEST) test strip 1 each by Other route 3 (three) times daily. 100 each 11  . ibuprofen (ADVIL,MOTRIN) 200 MG tablet Take 600 mg by mouth 2 (two) times daily.    . Insulin Pen Needle (B-D ULTRAFINE III SHORT PEN) 31G X 8 MM MISC 1 application by Does not apply route daily. 90 each 3  . lamoTRIgine (LAMICTAL) 200 MG tablet Take 1 tablet (200 mg total) by mouth daily. 90 tablet 1  . pregabalin (LYRICA) 100 MG capsule Take 1 capsule (100 mg total) by mouth 3 (three) times daily. For PASS dose change 270 capsule 3  . traZODone (DESYREL) 100 MG tablet Take 2 tablets (200 mg total) by mouth at bedtime. 180 tablet 1  . TRUEPLUS LANCETS 28G MISC 1 each by Does not apply route 3 (three) times daily. 100 each 11  . atorvastatin (LIPITOR) 40 MG tablet Take 1 tablet (40 mg total) by mouth daily. (Patient not taking: Reported on 01/04/2017) 90 tablet 3  . glimepiride (AMARYL) 4 MG tablet Take 2 tablets (8  mg total) by mouth daily with breakfast. 60 tablet 5  . liraglutide (VICTOZA) 18 MG/3ML SOPN Inject into skin  0.6 mg once daily for 1 week; then increase to 1.2 mg once daily 6 pen 3  . losartan-hydrochlorothiazide (HYZAAR) 100-25 MG tablet Take 1 tablet by mouth daily. 30 tablet 0  . metFORMIN (GLUCOPHAGE-XR) 500 MG 24 hr tablet Take 2 tablets (1,000 mg total) by mouth daily after supper. 60 tablet 5   No facility-administered medications prior to visit.     ROS Review of Systems  Constitutional: Negative for activity change, appetite change and fatigue.  HENT: Negative for congestion, sinus pressure and sore  throat.   Eyes: Negative for visual disturbance.  Respiratory: Negative for cough, chest tightness, shortness of breath and wheezing.   Cardiovascular: Negative for chest pain and palpitations.  Gastrointestinal: Negative for abdominal distention, abdominal pain and constipation.  Endocrine: Negative for polydipsia.  Genitourinary: Negative for dysuria and frequency.  Musculoskeletal: Negative for arthralgias and back pain.  Skin: Negative for rash.  Neurological: Negative for tremors, light-headedness and numbness.  Hematological: Does not bruise/bleed easily.  Psychiatric/Behavioral: Negative for agitation and behavioral problems.    Objective:  BP 138/88   Pulse 97   Temp 98 F (36.7 C) (Oral)   Ht 5' 4"  (1.626 m)   Wt 233 lb 9.6 oz (106 kg)   SpO2 96%   BMI 40.10 kg/m   BP/Weight 04/01/2017 6/72/0919 8/0/2217  Systolic BP 981 025 486  Diastolic BP 88 86 80  Wt. (Lbs) 233.6 235 234.8  BMI 40.1 40.34 40.3      Physical Exam  Constitutional: She is oriented to person, place, and time. She appears well-developed and well-nourished.  Cardiovascular: Normal rate, normal heart sounds and intact distal pulses.   No murmur heard. Pulmonary/Chest: Effort normal and breath sounds normal. She has no wheezes. She has no rales. She exhibits no tenderness.  Abdominal: Soft. Bowel sounds are normal. She exhibits no distension and no mass. There is no tenderness.  Musculoskeletal: Normal range of motion.  Neurological: She is alert and oriented to person, place, and time.  Psychiatric: Her mood appears anxious. Her speech is tangential.    CMP Latest Ref Rng & Units 10/21/2015 02/19/2015 05/03/2014  Glucose 65 - 99 mg/dL 91 101(H) 116(H)  BUN 7 - 25 mg/dL 15 15 12   Creatinine 0.50 - 1.10 mg/dL 0.64 0.77 0.79  Sodium 135 - 146 mmol/L 137 141 142  Potassium 3.5 - 5.3 mmol/L 4.4 4.8 5.0  Chloride 98 - 110 mmol/L 98 103 103  CO2 20 - 31 mmol/L 28 27 30   Calcium 8.6 - 10.2 mg/dL 9.8  9.7 9.2  Total Protein 6.0 - 8.3 g/dL - - 6.5  Total Bilirubin 0.2 - 1.2 mg/dL - - 0.3  Alkaline Phos 39 - 117 U/L - - 59  AST 0 - 37 U/L - - 22  ALT 0 - 35 U/L - - 24    Lipid Panel     Component Value Date/Time   CHOL 231 (H) 10/21/2015 1538   TRIG 289 (H) 10/21/2015 1538   HDL 49 10/21/2015 1538   CHOLHDL 4.7 10/21/2015 1538   VLDL 58 (H) 10/21/2015 1538   LDLCALC 124 10/21/2015 1538      Lab Results  Component Value Date   HGBA1C 8.4 04/01/2017    Assessment & Plan:   1. Uncontrolled type 2 diabetes mellitus with diabetic polyneuropathy, without long-term current use of insulin ([place]) Not  at goal with A1c of 8.4 Discontinue Victoza due to intolerance Increase metformin dose Diabetic diet, lifestyle modifications - POCT glucose (manual entry) - POCT glycosylated hemoglobin (Hb A1C) - glimepiride (AMARYL) 4 MG tablet; Take 2 tablets (8 mg total) by mouth daily with breakfast.  Dispense: 60 tablet; Refill: 5 - metFORMIN (GLUCOPHAGE-XR) 500 MG 24 hr tablet; Take 2 tablets (1,000 mg total) by mouth 2 (two) times daily.  Dispense: 120 tablet; Refill: 5 - CMP14+EGFR; Future - Lipid panel; Future - Microalbumin/Creatinine Ratio, Urine; Future  2. Pure hypercholesterolemia No recent lipid panel She has not been compliant with Lipitor hence no changes will be made if lipids are elevated - atorvastatin (LIPITOR) 40 MG tablet; Take 1 tablet (40 mg total) by mouth daily.  Dispense: 40 tablet; Refill: 5  3. Essential hypertension Controlled Low-sodium, DASH diet - losartan-hydrochlorothiazide (HYZAAR) 100-25 MG tablet; Take 1 tablet by mouth daily.  Dispense: 30 tablet; Refill: 5  4. Pain of left hip joint Will need to exclude underlying osteoarthritis Use OTC NSAIDs - XR HIPS BILAT W OR W/O PELVIS 3-4 VIEWS  5. Anxiety and depression Uncontrolled Continue trazodone, Wellbutrin, Lamictal Hydroxyzine added when necessary due to ongoing anxiety until visit with  psychiatry next month - hydrOXYzine (ATARAX/VISTARIL) 25 MG tablet; Take 1 tablet (25 mg total) by mouth 3 (three) times daily as needed.  Dispense: 60 tablet; Refill: 0   Meds ordered this encounter  Medications  . glimepiride (AMARYL) 4 MG tablet    Sig: Take 2 tablets (8 mg total) by mouth daily with breakfast.    Dispense:  60 tablet    Refill:  5  . atorvastatin (LIPITOR) 40 MG tablet    Sig: Take 1 tablet (40 mg total) by mouth daily.    Dispense:  40 tablet    Refill:  5  . metFORMIN (GLUCOPHAGE-XR) 500 MG 24 hr tablet    Sig: Take 2 tablets (1,000 mg total) by mouth 2 (two) times daily.    Dispense:  120 tablet    Refill:  5  . losartan-hydrochlorothiazide (HYZAAR) 100-25 MG tablet    Sig: Take 1 tablet by mouth daily.    Dispense:  30 tablet    Refill:  5    Must have office visit for refills  . hydrOXYzine (ATARAX/VISTARIL) 25 MG tablet    Sig: Take 1 tablet (25 mg total) by mouth 3 (three) times daily as needed.    Dispense:  60 tablet    Refill:  0    Follow-up: Return in about 3 months (around 07/02/2017) for follow up on diabetes mellitus.   Arnoldo Morale MD

## 2017-04-01 NOTE — Patient Instructions (Signed)
Diabetes Mellitus and Food It is important for you to manage your blood sugar (glucose) level. Your blood glucose level can be greatly affected by what you eat. Eating healthier foods in the appropriate amounts throughout the day at about the same time each day will help you control your blood glucose level. It can also help slow or prevent worsening of your diabetes mellitus. Healthy eating may even help you improve the level of your blood pressure and reach or maintain a healthy weight. General recommendations for healthful eating and cooking habits include:  Eating meals and snacks regularly. Avoid going long periods of time without eating to lose weight.  Eating a diet that consists mainly of plant-based foods, such as fruits, vegetables, nuts, legumes, and whole grains.  Using low-heat cooking methods, such as baking, instead of high-heat cooking methods, such as deep frying.  Work with your dietitian to make sure you understand how to use the Nutrition Facts information on food labels. How can food affect me? Carbohydrates Carbohydrates affect your blood glucose level more than any other type of food. Your dietitian will help you determine how many carbohydrates to eat at each meal and teach you how to count carbohydrates. Counting carbohydrates is important to keep your blood glucose at a healthy level, especially if you are using insulin or taking certain medicines for diabetes mellitus. Alcohol Alcohol can cause sudden decreases in blood glucose (hypoglycemia), especially if you use insulin or take certain medicines for diabetes mellitus. Hypoglycemia can be a life-threatening condition. Symptoms of hypoglycemia (sleepiness, dizziness, and disorientation) are similar to symptoms of having too much alcohol. If your health care provider has given you approval to drink alcohol, do so in moderation and use the following guidelines:  Women should not have more than one drink per day, and men  should not have more than two drinks per day. One drink is equal to: ? 12 oz of beer. ? 5 oz of wine. ? 1 oz of hard liquor.  Do not drink on an empty stomach.  Keep yourself hydrated. Have water, diet soda, or unsweetened iced tea.  Regular soda, juice, and other mixers might contain a lot of carbohydrates and should be counted.  What foods are not recommended? As you make food choices, it is important to remember that all foods are not the same. Some foods have fewer nutrients per serving than other foods, even though they might have the same number of calories or carbohydrates. It is difficult to get your body what it needs when you eat foods with fewer nutrients. Examples of foods that you should avoid that are high in calories and carbohydrates but low in nutrients include:  Trans fats (most processed foods list trans fats on the Nutrition Facts label).  Regular soda.  Juice.  Candy.  Sweets, such as cake, pie, doughnuts, and cookies.  Fried foods.  What foods can I eat? Eat nutrient-rich foods, which will nourish your body and keep you healthy. The food you should eat also will depend on several factors, including:  The calories you need.  The medicines you take.  Your weight.  Your blood glucose level.  Your blood pressure level.  Your cholesterol level.  You should eat a variety of foods, including:  Protein. ? Lean cuts of meat. ? Proteins low in saturated fats, such as fish, egg whites, and beans. Avoid processed meats.  Fruits and vegetables. ? Fruits and vegetables that may help control blood glucose levels, such as apples,   mangoes, and yams.  Dairy products. ? Choose fat-free or low-fat dairy products, such as milk, yogurt, and cheese.  Grains, bread, pasta, and rice. ? Choose whole grain products, such as multigrain bread, whole oats, and brown rice. These foods may help control blood pressure.  Fats. ? Foods containing healthful fats, such as  nuts, avocado, olive oil, canola oil, and fish.  Does everyone with diabetes mellitus have the same meal plan? Because every person with diabetes mellitus is different, there is not one meal plan that works for everyone. It is very important that you meet with a dietitian who will help you create a meal plan that is just right for you. This information is not intended to replace advice given to you by your health care provider. Make sure you discuss any questions you have with your health care provider. Document Released: 02/25/2005 Document Revised: 11/06/2015 Document Reviewed: 04/27/2013 Elsevier Interactive Patient Education  2017 Elsevier Inc.  

## 2017-04-07 ENCOUNTER — Ambulatory Visit: Payer: Self-pay | Attending: Family Medicine

## 2017-04-07 DIAGNOSIS — E1165 Type 2 diabetes mellitus with hyperglycemia: Secondary | ICD-10-CM | POA: Insufficient documentation

## 2017-04-07 DIAGNOSIS — IMO0002 Reserved for concepts with insufficient information to code with codable children: Secondary | ICD-10-CM

## 2017-04-07 DIAGNOSIS — E1142 Type 2 diabetes mellitus with diabetic polyneuropathy: Secondary | ICD-10-CM | POA: Insufficient documentation

## 2017-04-07 NOTE — Progress Notes (Signed)
Patient here for lab visit only 

## 2017-04-08 LAB — CMP14+EGFR
ALT: 46 IU/L — ABNORMAL HIGH (ref 0–32)
AST: 48 IU/L — ABNORMAL HIGH (ref 0–40)
Albumin/Globulin Ratio: 1.4 (ref 1.2–2.2)
Albumin: 4 g/dL (ref 3.5–5.5)
Alkaline Phosphatase: 83 IU/L (ref 39–117)
BUN/Creatinine Ratio: 14 (ref 9–23)
BUN: 10 mg/dL (ref 6–24)
Bilirubin Total: 0.2 mg/dL (ref 0.0–1.2)
CO2: 25 mmol/L (ref 20–29)
Calcium: 9.5 mg/dL (ref 8.7–10.2)
Chloride: 99 mmol/L (ref 96–106)
Creatinine, Ser: 0.72 mg/dL (ref 0.57–1.00)
GFR calc Af Amer: 112 mL/min/{1.73_m2} (ref >59)
GFR calc non Af Amer: 97 mL/min/{1.73_m2} (ref >59)
Globulin, Total: 2.8 g/dL (ref 1.5–4.5)
Glucose: 137 mg/dL — ABNORMAL HIGH (ref 65–99)
Potassium: 4.3 mmol/L (ref 3.5–5.2)
Sodium: 141 mmol/L (ref 134–144)
Total Protein: 6.8 g/dL (ref 6.0–8.5)

## 2017-04-08 LAB — LIPID PANEL
CHOLESTEROL TOTAL: 168 mg/dL (ref 100–199)
Chol/HDL Ratio: 4.2 ratio (ref 0.0–4.4)
HDL: 40 mg/dL (ref >39)
LDL Calculated: 97 mg/dL (ref 0–99)
Triglycerides: 154 mg/dL — ABNORMAL HIGH (ref 0–149)
VLDL Cholesterol Cal: 31 mg/dL (ref 5–40)

## 2017-04-12 ENCOUNTER — Telehealth: Payer: Self-pay | Admitting: Family Medicine

## 2017-04-12 NOTE — Telephone Encounter (Signed)
Pt called to request the lab result , please follow up

## 2017-04-19 MED FILL — BUPROPION HCL XL 150 MG TAB: 150 | 30 days supply | Qty: 30 | Fill #2

## 2017-04-19 MED FILL — ?GLIMEPIRIDE 4 MG TABLET: 4 | 30 days supply | Qty: 60 | Fill #0

## 2017-04-19 MED FILL — traZODone HCL 100 MG TABS: 100 | 30 days supply | Qty: 60 | Fill #3

## 2017-04-19 MED FILL — lamoTRIgine 200 MG TABS: 200 | 30 days supply | Qty: 30 | Fill #3

## 2017-04-19 MED FILL — LOSARTAN-HCTZ 100-25 MG TAB: 100-25 | 30 days supply | Qty: 30 | Fill #0

## 2017-04-26 ENCOUNTER — Ambulatory Visit (INDEPENDENT_AMBULATORY_CARE_PROVIDER_SITE_OTHER): Payer: No Typology Code available for payment source | Admitting: Psychiatry

## 2017-04-26 ENCOUNTER — Encounter (HOSPITAL_COMMUNITY): Payer: Self-pay | Admitting: Psychiatry

## 2017-04-26 VITALS — BP 122/78 | HR 78 | Ht 64.0 in | Wt 230.0 lb

## 2017-04-26 DIAGNOSIS — R454 Irritability and anger: Secondary | ICD-10-CM

## 2017-04-26 DIAGNOSIS — R45851 Suicidal ideations: Secondary | ICD-10-CM

## 2017-04-26 DIAGNOSIS — F3181 Bipolar II disorder: Secondary | ICD-10-CM

## 2017-04-26 DIAGNOSIS — F331 Major depressive disorder, recurrent, moderate: Secondary | ICD-10-CM

## 2017-04-26 DIAGNOSIS — F17210 Nicotine dependence, cigarettes, uncomplicated: Secondary | ICD-10-CM

## 2017-04-26 DIAGNOSIS — F4312 Post-traumatic stress disorder, chronic: Secondary | ICD-10-CM

## 2017-04-26 DIAGNOSIS — Z813 Family history of other psychoactive substance abuse and dependence: Secondary | ICD-10-CM

## 2017-04-26 MED ORDER — LAMOTRIGINE 200 MG PO TABS: 200.0000 mg | ORAL_TABLET | Freq: Every day | ORAL | 1 refills | Status: DC

## 2017-04-26 MED ORDER — TRAZODONE HCL 100 MG PO TABS: 200.0000 mg | ORAL_TABLET | Freq: Every day | ORAL | 1 refills | Status: DC

## 2017-04-26 MED ORDER — GUANFACINE HCL 1 MG PO TABS: 1.0000 mg | ORAL_TABLET | Freq: Two times a day (BID) | ORAL | 1 refills | Status: DC

## 2017-04-26 MED ORDER — LURASIDONE HCL 20 MG PO TABS: 20.0000 mg | ORAL_TABLET | Freq: Every day | ORAL | 1 refills | Status: DC

## 2017-04-26 MED FILL — !LATUDA 20 MG TABLET: 20 | 30 days supply | Qty: 30 | Fill #0

## 2017-04-26 MED FILL — guanFACINE HCL 1 MG TABS: 1 | 30 days supply | Qty: 60 | Fill #0

## 2017-04-26 NOTE — Patient Instructions (Signed)
STOP wellbutrin

## 2017-04-26 NOTE — Progress Notes (Signed)
[facility] MD/PA/NP OP Progress Note  04/26/2017 2:06 PM Courtney Dixon  MRN:  [id number]  Chief Complaint:  Chief Complaint    Follow-up    Depressed, anxious, angry, irritable HPI: Patient reports that her mood has been down, irritable, angry, depressed.  She continues to be fatigued, does NOT feel that Wellbutrin has been helpful.  She has passive suicidal thoughts but no intention to harm herself.  She reports that she continues to be easily frustrated by external factors, ruminates about past events in the days stressors.  She reports that she has trouble sleeping due to weird dreams, and rumination.  She presents today as hyperverbal, irritable, distractible.  I spent time educating the patient about possible bipolar 2 disorder, and cycles of mood changes.  She agrees and tends to feel worse during the winter and fall months.  We agreed to continue Lamictal and add Latuda 20 mg daily.  We also agreed to discontinue Wellbutrin given lack of benefit.  For distractibility and restlessness during the day, I suggested we start Tenex 1 mg twice a day, and I educated her that this may lower her blood pressure.  She is not using Vistaril which was sent by her primary care provider.  I continue to encourage her to consider individual therapy, and she reports that she has tried it in the past and it made her feel worse.  She continues with very rigid thinking, and I spent time exploring some of her black and white thinking, and how this is affected her interpersonal relationships and sense of self.  Visit Diagnosis:    ICD-10-CM   1. Bipolar 2 disorder, major depressive episode (HCC) F31.81 lurasidone (LATUDA) 20 MG TABS tablet    guanFACINE (TENEX) 1 MG tablet  2. Chronic post-traumatic stress disorder (PTSD) F43.12 lamoTRIgine (LAMICTAL) 200 MG tablet    traZODone (DESYREL) 100 MG tablet    guanFACINE (TENEX) 1 MG tablet  3. Moderate episode of recurrent major depressive disorder (HCC) F33.1 traZODone  (DESYREL) 100 MG tablet    guanFACINE (TENEX) 1 MG tablet    Past Psychiatric History: See intake H&P for full details. Reviewed, with no updates at this time.   Past Medical History:  Past Medical History:  Diagnosis Date  . Anxiety   . Depression   . Diabetes mellitus, type II ([place])   . Hypertension   . Migraines   . Neuropathy     Past Surgical History:  Procedure Laterality Date  . CESAREAN SECTION    . TUBAL LIGATION      Family Psychiatric History: See intake H&P for full details. Reviewed, with no updates at this time.   Family History:  Family History  Problem Relation Age of Onset  . Cancer Mother   . Drug abuse Mother   . Heart disease Mother   . Hypertension Mother   . Diabetes Mother   . Cancer Father   . Diabetes Sister   . Diabetes Sister     Social History:  Social History   Socioeconomic History  . Marital status: Married    Spouse name: None  . Number of children: None  . Years of education: None  . Highest education level: None  Social Needs  . Financial resource strain: None  . Food insecurity - worry: None  . Food insecurity - inability: None  . Transportation needs - medical: None  . Transportation needs - non-medical: None  Occupational History  . None  Tobacco Use  . Smoking status:  Current Every Day Smoker    Packs/day: 1.00    Years: 36.00    Pack years: 36.00    Types: Cigarettes  . Smokeless tobacco: Never Used  Substance and Sexual Activity  . Alcohol use: No    Comment: Rarely uses   . Drug use: No  . Sexual activity: Yes    Partners: Male    Birth control/protection: Surgical  Other Topics Concern  . None  Social History Narrative  . None    Allergies:  Allergies  Allergen Reactions  . Sulfa Antibiotics Itching    Metabolic Disorder Labs: Lab Results  Component Value Date   HGBA1C 8.4 04/01/2017   MPG 137 (H) 05/03/2014   MPG 137 (H) 11/07/2012   No results found for: PROLACTIN Lab Results   Component Value Date   CHOL 168 04/07/2017   TRIG 154 (H) 04/07/2017   HDL 40 04/07/2017   CHOLHDL 4.2 04/07/2017   VLDL 58 (H) 10/21/2015   LDLCALC 97 04/07/2017   LDLCALC 124 10/21/2015   Lab Results  Component Value Date   TSH 1.87 10/21/2015   TSH 2.823 11/07/2012    Therapeutic Level Labs: No results found for: LITHIUM No results found for: VALPROATE No components found for:  CBMZ  Current Medications: Current Outpatient Medications  Medication Sig Dispense Refill  . albuterol (PROVENTIL HFA;VENTOLIN HFA) 108 (90 Base) MCG/ACT inhaler Inhale 2 puffs into the lungs every 6 (six) hours as needed for wheezing or shortness of breath. 1 Inhaler 0  . atorvastatin (LIPITOR) 40 MG tablet Take 1 tablet (40 mg total) by mouth daily. 40 tablet 5  . Blood Glucose Monitoring Suppl (TRUE METRIX METER) w/Device KIT 1 each by Does not apply route as needed. 1 kit 0  . glimepiride (AMARYL) 4 MG tablet Take 2 tablets (8 mg total) by mouth daily with breakfast. 60 tablet 5  . glucose blood (TRUE METRIX BLOOD GLUCOSE TEST) test strip 1 each by Other route 3 (three) times daily. 100 each 11  . guanFACINE (TENEX) 1 MG tablet Take 1 tablet (1 mg total) 2 (two) times daily by mouth. 180 tablet 1  . ibuprofen (ADVIL,MOTRIN) 200 MG tablet Take 600 mg by mouth 2 (two) times daily.    . Insulin Pen Needle (B-D ULTRAFINE III SHORT PEN) 31G X 8 MM MISC 1 application by Does not apply route daily. 90 each 3  . lamoTRIgine (LAMICTAL) 200 MG tablet Take 1 tablet (200 mg total) daily by mouth. 90 tablet 1  . losartan-hydrochlorothiazide (HYZAAR) 100-25 MG tablet Take 1 tablet by mouth daily. 30 tablet 5  . lurasidone (LATUDA) 20 MG TABS tablet Take 1 tablet (20 mg total) daily by mouth. 90 tablet 1  . metFORMIN (GLUCOPHAGE-XR) 500 MG 24 hr tablet Take 2 tablets (1,000 mg total) by mouth 2 (two) times daily. 120 tablet 5  . pregabalin (LYRICA) 100 MG capsule Take 1 capsule (100 mg total) by mouth 3 (three)  times daily. For PASS dose change 270 capsule 3  . traZODone (DESYREL) 100 MG tablet Take 2 tablets (200 mg total) at bedtime by mouth. 180 tablet 1  . TRUEPLUS LANCETS 28G MISC 1 each by Does not apply route 3 (three) times daily. 100 each 11   No current facility-administered medications for this visit.      Musculoskeletal: Strength & Muscle Tone: within normal limits Gait & Station: normal Patient leans: N/A  Psychiatric Specialty Exam: ROS  Blood pressure 122/78, pulse 78, height 5'  4" (1.626 m), weight 230 lb (104.3 kg), SpO2 95 %.Body mass index is 39.48 kg/m.  General Appearance: Casual and Fairly Groomed  Eye Contact:  Fair  Speech:  Clear and Coherent  Volume:  Normal  Mood:  Anxious, Depressed, Dysphoric and Irritable  Affect:  Congruent and Labile  Thought Process:  Goal Directed and Descriptions of Associations: Intact  Orientation:  Full (Time, Place, and Person)  Thought Content: Rumination   Suicidal Thoughts:  Yes.  without intent/plan  Homicidal Thoughts:  No  Memory:  Immediate;   Fair  Judgement:  Fair  Insight:  Fair  Psychomotor Activity:  Normal  Concentration:  Attention Span: Poor  Recall:  [employer] of Knowledge: Fair  Language: Good  Akathisia:  Negative  Handed:  Right  AIMS (if indicated): not done  Assets:  Communication Skills Desire for Improvement Housing Transportation  ADL's:  Intact  Cognition: WNL  Sleep:  Fair   Screenings: GAD-7     Office Visit from 10/11/2016 in [facility] Office Visit from 08/13/2016 in [facility] Office Visit from 07/22/2016 in [facility] Office Visit from 05/13/2016 in [facility] Office Visit from 03/18/2016 in [facility]  Total GAD-7 Score  18  19  12  13  14     PHQ2-9     Office Visit from 10/11/2016 in [facility] Office  Visit from 08/13/2016 in [facility] Office Visit from 07/22/2016 in [facility] Office Visit from 05/13/2016 in [facility] Office Visit from 03/18/2016 in [facility]  PHQ-2 Total Score  5  4  4  3  3   PHQ-9 Total Score  19  13  11  11  16        Assessment and Plan:  Jamiyla Ishee is a 51 year old female with chronic PTSD and cyclical mood symptoms suggestive of possible bipolar 2 disorder.  She has typically responded well to mood stabilizers, but has had concerns about weight gain in the past on Seroquel.  We continue on Lamictal, and will initiate Latuda given a more favorable metabolic profile.  She endorses chronic passive suicidal thoughts, and chronic external locus of control.  We will proceed as below and continue to recommend she engage in individual therapy which she has currently declined.  I spent time with the patient today educating her on the pharmacology of the medication changes as made below, and educating her on the utility of individual therapy.  1. Bipolar 2 disorder, major depressive episode ([place])   2. Chronic post-traumatic stress disorder (PTSD)   3. Moderate episode of recurrent major depressive disorder (HCC)     Status of current problems: gradually worsening  Labs Ordered: No orders of the defined types were placed in this encounter.   Labs Reviewed: n/a  Collateral Obtained/Records Reviewed: n/a  Plan:  Initiate Latuda 20 mg daily for bipolar depression Continue Lamictal 200 mg daily for bipolar depression Discontinue Wellbutrin Tenex 1 mg twice daily for anxiety, hyperactivity, irritability Return to clinic in 10-12 weeks Continue to encourage individual therapy  I spent 30 minutes with the patient in direct face-to-face clinical care.  Greater than 50% of this time was spent in counseling and coordination of care with the  patient.    Aundra Dubin, MD  04/26/2017, 2:06 PM

## 2017-05-17 MED FILL — ATORVASTATIN 40 MG TABLET: 40 | 30 days supply | Qty: 30 | Fill #1

## 2017-05-17 MED FILL — LOSARTAN-HCTZ 100-25 MG TAB: 100-25 | 30 days supply | Qty: 30 | Fill #1

## 2017-05-17 MED FILL — ?GLIMEPIRIDE 4 MG TABLET: 4 | 30 days supply | Qty: 60 | Fill #1

## 2017-05-17 MED FILL — METFORMIN HCL ER 500 MG TAB: 500 | 30 days supply | Qty: 120 | Fill #1

## 2017-05-17 MED FILL — !LATUDA 20 MG TABLET: 20 | 30 days supply | Qty: 30 | Fill #1

## 2017-05-17 MED FILL — lamoTRIgine 200 MG TABS: 200 | 30 days supply | Qty: 30 | Fill #4

## 2017-05-17 MED FILL — traZODone HCL 100 MG TABS: 100 | 30 days supply | Qty: 60 | Fill #4

## 2017-05-18 ENCOUNTER — Other Ambulatory Visit: Payer: Self-pay

## 2017-05-18 DIAGNOSIS — F3181 Bipolar II disorder: Secondary | ICD-10-CM

## 2017-05-18 MED ORDER — LURASIDONE HCL 20 MG PO TABS: 20.0000 mg | ORAL_TABLET | Freq: Every day | ORAL | 3 refills | Status: DC

## 2017-06-14 HISTORY — PX: BREAST BIOPSY: SHX20

## 2017-06-15 ENCOUNTER — Other Ambulatory Visit: Payer: Self-pay

## 2017-06-15 DIAGNOSIS — E119 Type 2 diabetes mellitus without complications: Secondary | ICD-10-CM

## 2017-06-15 DIAGNOSIS — F17200 Nicotine dependence, unspecified, uncomplicated: Secondary | ICD-10-CM

## 2017-06-15 MED ORDER — ALBUTEROL SULFATE HFA 108 (90 BASE) MCG/ACT IN AERS: 2.0000 | INHALATION_SPRAY | Freq: Four times a day (QID) | RESPIRATORY_TRACT | 0 refills | Status: DC | PRN

## 2017-06-15 MED ORDER — GLUCOSE BLOOD VI STRP: 1.0000 | ORAL_STRIP | Freq: Three times a day (TID) | 11 refills | Status: DC

## 2017-06-15 MED FILL — !VENTOLIN HFA INHALER: 108 (90 BAS | 25 days supply | Qty: 18 | Fill #0

## 2017-06-15 MED FILL — ?ATORVASTATIN 40MG TABLET: 40 | 30 days supply | Qty: 30 | Fill #2

## 2017-06-15 MED FILL — LOSARTAN-HCTZ 100-25 MG TAB: 100-25 | 30 days supply | Qty: 30 | Fill #2

## 2017-06-15 MED FILL — METFORMIN HCL ER 500 MG TAB: 500 | 30 days supply | Qty: 120 | Fill #2

## 2017-06-15 MED FILL — LATUDA 20 MG TABLET: 20 | 30 days supply | Qty: 30 | Fill #2

## 2017-06-15 MED FILL — ?GLIMEPIRIDE 4 MG TABLET: 4 | 30 days supply | Qty: 60 | Fill #2

## 2017-06-15 MED FILL — lamoTRIgine 200 MG TABS: 200 | 30 days supply | Qty: 30 | Fill #5

## 2017-06-15 MED FILL — ?GUANFACINE 1 MG TABLET: 1 | 30 days supply | Qty: 60 | Fill #1

## 2017-06-15 MED FILL — TRUE METRIX TEST STRIP: 30 days supply | Qty: 100 | Fill #0

## 2017-06-15 MED FILL — traZODone HCL 100 MG TABS: 100 | 30 days supply | Qty: 60 | Fill #5

## 2017-06-27 ENCOUNTER — Other Ambulatory Visit: Payer: Self-pay

## 2017-06-27 DIAGNOSIS — F3181 Bipolar II disorder: Secondary | ICD-10-CM

## 2017-06-30 ENCOUNTER — Other Ambulatory Visit: Payer: Self-pay

## 2017-06-30 DIAGNOSIS — M792 Neuralgia and neuritis, unspecified: Secondary | ICD-10-CM

## 2017-06-30 MED ORDER — PREGABALIN 100 MG PO CAPS: 100.0000 mg | ORAL_CAPSULE | Freq: Three times a day (TID) | ORAL | 3 refills | Status: DC

## 2017-07-04 ENCOUNTER — Encounter: Payer: Self-pay | Admitting: Family Medicine

## 2017-07-04 ENCOUNTER — Ambulatory Visit: Payer: Self-pay | Attending: Family Medicine | Admitting: Family Medicine

## 2017-07-04 VITALS — BP 124/88 | HR 71 | Temp 98.4°F | Ht 64.0 in | Wt 235.0 lb

## 2017-07-04 DIAGNOSIS — M25552 Pain in left hip: Secondary | ICD-10-CM | POA: Insufficient documentation

## 2017-07-04 DIAGNOSIS — Z1239 Encounter for other screening for malignant neoplasm of breast: Secondary | ICD-10-CM

## 2017-07-04 DIAGNOSIS — Z1211 Encounter for screening for malignant neoplasm of colon: Secondary | ICD-10-CM

## 2017-07-04 DIAGNOSIS — I10 Essential (primary) hypertension: Secondary | ICD-10-CM

## 2017-07-04 DIAGNOSIS — F3175 Bipolar disorder, in partial remission, most recent episode depressed: Secondary | ICD-10-CM

## 2017-07-04 DIAGNOSIS — IMO0002 Reserved for concepts with insufficient information to code with codable children: Secondary | ICD-10-CM

## 2017-07-04 DIAGNOSIS — F4310 Post-traumatic stress disorder, unspecified: Secondary | ICD-10-CM | POA: Insufficient documentation

## 2017-07-04 DIAGNOSIS — E7800 Pure hypercholesterolemia, unspecified: Secondary | ICD-10-CM

## 2017-07-04 DIAGNOSIS — Z1231 Encounter for screening mammogram for malignant neoplasm of breast: Secondary | ICD-10-CM

## 2017-07-04 DIAGNOSIS — R202 Paresthesia of skin: Secondary | ICD-10-CM | POA: Insufficient documentation

## 2017-07-04 DIAGNOSIS — E1142 Type 2 diabetes mellitus with diabetic polyneuropathy: Secondary | ICD-10-CM

## 2017-07-04 DIAGNOSIS — Z794 Long term (current) use of insulin: Secondary | ICD-10-CM | POA: Insufficient documentation

## 2017-07-04 DIAGNOSIS — G5602 Carpal tunnel syndrome, left upper limb: Secondary | ICD-10-CM

## 2017-07-04 DIAGNOSIS — E1165 Type 2 diabetes mellitus with hyperglycemia: Secondary | ICD-10-CM

## 2017-07-04 DIAGNOSIS — F319 Bipolar disorder, unspecified: Secondary | ICD-10-CM | POA: Insufficient documentation

## 2017-07-04 DIAGNOSIS — Z79899 Other long term (current) drug therapy: Secondary | ICD-10-CM | POA: Insufficient documentation

## 2017-07-04 LAB — POCT GLYCOSYLATED HEMOGLOBIN (HGB A1C): Hemoglobin A1C: 7.9

## 2017-07-04 LAB — GLUCOSE, POCT (MANUAL RESULT ENTRY): POC GLUCOSE: 161 mg/dL — AB (ref 70–99)

## 2017-07-04 MED ORDER — ATORVASTATIN CALCIUM 40 MG PO TABS: 40.0000 mg | ORAL_TABLET | Freq: Every day | ORAL | 5 refills | Status: AC

## 2017-07-04 MED ORDER — CANAGLIFLOZIN 100 MG PO TABS: 100.0000 mg | ORAL_TABLET | Freq: Every day | ORAL | 5 refills | Status: DC

## 2017-07-04 MED ORDER — LOSARTAN POTASSIUM-HCTZ 100-25 MG PO TABS
1.0000 | ORAL_TABLET | Freq: Every day | ORAL | 5 refills | Status: AC
Start: 2017-07-04 — End: ?

## 2017-07-04 MED ORDER — METFORMIN HCL ER 500 MG PO TB24: 1000.0000 mg | ORAL_TABLET | Freq: Two times a day (BID) | ORAL | 5 refills | Status: AC

## 2017-07-04 MED ORDER — GLIMEPIRIDE 4 MG PO TABS: 8.0000 mg | ORAL_TABLET | Freq: Every day | ORAL | 5 refills | Status: AC

## 2017-07-04 MED FILL — INVOKANA 100 MG TABLET: 100 | 30 days supply | Qty: 30 | Fill #0

## 2017-07-04 NOTE — Patient Instructions (Signed)
Carpal Tunnel Syndrome Carpal tunnel syndrome is a condition that causes pain in your hand and arm. The carpal tunnel is a narrow area that is on the palm side of your wrist. Repeated wrist motion or certain diseases may cause swelling in the tunnel. This swelling can pinch the main nerve in the wrist (median nerve). Follow these instructions at home: If you have a splint:  Wear it as told by your doctor. Remove it only as told by your doctor.  Loosen the splint if your fingers: ? Become numb and tingle. ? Turn blue and cold.  Keep the splint clean and dry. General instructions  Take over-the-counter and prescription medicines only as told by your doctor.  Rest your wrist from any activity that may be causing your pain. If needed, talk to your employer about changes that can be made in your work, such as getting a wrist pad to use while typing.  If directed, apply ice to the painful area: ? Put ice in a plastic bag. ? Place a towel between your skin and the bag. ? Leave the ice on for 20 minutes, 2-3 times per day.  Keep all follow-up visits as told by your doctor. This is important.  Do any exercises as told by your doctor, physical therapist, or occupational therapist. Contact a doctor if:  You have new symptoms.  Medicine does not help your pain.  Your symptoms get worse. This information is not intended to replace advice given to you by your health care provider. Make sure you discuss any questions you have with your health care provider. Document Released: 05/20/2011 Document Revised: 11/06/2015 Document Reviewed: 10/16/2014 Elsevier Interactive Patient Education  2018 [employer] Inc.  

## 2017-07-04 NOTE — Progress Notes (Signed)
Subjective:  Patient ID: Courtney Dixon, female    DOB: [birth year]  Age: 53 y.o. MRN: [id number]  CC: Diabetes  HPI Courtney Dixon is a 52 year old female with a history of type 2 diabetes mellitus (A1c 7.9), diabetic neuropathy, hypertension, Bipolar disorder, PTSD, hyperlipidemia who presents today for a follow-up visit.  Her A1c is 7.9 which has improved from 8.4 previously and she endorses compliance with her medications but has not been exercising regularly and has not really been compliant with a diabetic diet. Fasting sugars have been in the 140 range and her neuropathy is controlled on Lyrica.  She denies visual concerns.  She has been compliant with her antihypertensive and denies adverse effect from her medications. Her bipolar disorder is managed by mental health at [facility] and her last office visit was 2 months ago at which time Wellbutrin was discontinued and she was commenced on Tenex as well as Lamictal and Latuda and she reports doing well on them.  She complains of tingling in her hands which showed up to the dorsum of her fingers up to her forearm more pronounced when she performs house chores.  She denies reduced strength in her hands or dropping things. She also has a left hip pain which is worse with bending, cleaning and I had referred her for a pelvic x-ray which she never underwent.  Past Medical History:  Diagnosis Date  . Anxiety   . Depression   . Diabetes mellitus, type II ([place])   . Hypertension   . Migraines   . Neuropathy     Past Surgical History:  Procedure Laterality Date  . CESAREAN SECTION    . TUBAL LIGATION      Allergies  Allergen Reactions  . Sulfa Antibiotics Itching     Outpatient Medications Prior to Visit  Medication Sig Dispense Refill  . albuterol (PROVENTIL HFA;VENTOLIN HFA) 108 (90 Base) MCG/ACT inhaler Inhale 2 puffs into the lungs every 6 (six) hours as needed for wheezing or shortness of breath. 1 Inhaler 0  . Blood Glucose  Monitoring Suppl (TRUE METRIX METER) w/Device KIT 1 each by Does not apply route as needed. 1 kit 0  . glucose blood (TRUE METRIX BLOOD GLUCOSE TEST) test strip 1 each by Other route 3 (three) times daily. 100 each 11  . guanFACINE (TENEX) 1 MG tablet Take 1 tablet (1 mg total) 2 (two) times daily by mouth. 180 tablet 1  . ibuprofen (ADVIL,MOTRIN) 200 MG tablet Take 600 mg by mouth 2 (two) times daily.    . Insulin Pen Needle (B-D ULTRAFINE III SHORT PEN) 31G X 8 MM MISC 1 application by Does not apply route daily. 90 each 3  . lamoTRIgine (LAMICTAL) 200 MG tablet Take 1 tablet (200 mg total) daily by mouth. 90 tablet 1  . lurasidone (LATUDA) 20 MG TABS tablet Take 20 mg by mouth daily.    . pregabalin (LYRICA) 100 MG capsule Take 1 capsule (100 mg total) by mouth 3 (three) times daily. For PASS dose change 270 capsule 3  . traZODone (DESYREL) 100 MG tablet Take 2 tablets (200 mg total) at bedtime by mouth. 180 tablet 1  . TRUEPLUS LANCETS 28G MISC 1 each by Does not apply route 3 (three) times daily. 100 each 11  . atorvastatin (LIPITOR) 40 MG tablet Take 1 tablet (40 mg total) by mouth daily. 40 tablet 5  . glimepiride (AMARYL) 4 MG tablet Take 2 tablets (8 mg total) by mouth daily with breakfast. 60 tablet  5  . losartan-hydrochlorothiazide (HYZAAR) 100-25 MG tablet Take 1 tablet by mouth daily. 30 tablet 5  . metFORMIN (GLUCOPHAGE-XR) 500 MG 24 hr tablet Take 2 tablets (1,000 mg total) by mouth 2 (two) times daily. 120 tablet 5   No facility-administered medications prior to visit.     ROS Review of Systems  Constitutional: Negative for activity change, appetite change and fatigue.  HENT: Negative for congestion, sinus pressure and sore throat.   Eyes: Negative for visual disturbance.  Respiratory: Negative for cough, chest tightness, shortness of breath and wheezing.   Cardiovascular: Negative for chest pain and palpitations.  Gastrointestinal: Negative for abdominal distention,  abdominal pain and constipation.  Endocrine: Negative for polydipsia.  Genitourinary: Negative for dysuria and frequency.  Musculoskeletal:       See hpi  Skin: Negative for rash.  Neurological: Positive for numbness. Negative for tremors and light-headedness.  Hematological: Does not bruise/bleed easily.  Psychiatric/Behavioral: Negative for agitation and behavioral problems.    Objective:  BP 124/88   Pulse 71   Temp 98.4 F (36.9 C) (Oral)   Ht 5' 4"  (1.626 m)   Wt 235 lb (106.6 kg)   SpO2 95%   BMI 40.34 kg/m   BP/Weight 07/04/2017 04/26/2017 97/07/6376  Systolic BP 588 502 774  Diastolic BP 88 78 88  Wt. (Lbs) 235 230 233.6  BMI 40.34 39.48 40.1      Physical Exam  Constitutional: She is oriented to person, place, and time. She appears well-developed and well-nourished.  Cardiovascular: Normal rate, normal heart sounds and intact distal pulses.  No murmur heard. Pulmonary/Chest: Effort normal and breath sounds normal. She has no wheezes. She has no rales. She exhibits no tenderness.  Abdominal: Soft. Bowel sounds are normal. She exhibits no distension and no mass. There is no tenderness.  Musculoskeletal: Normal range of motion.  Neurological: She is alert and oriented to person, place, and time.  Skin: Skin is warm and dry.  Psychiatric: She has a normal mood and affect.     CMP Latest Ref Rng & Units 04/07/2017 10/21/2015 02/19/2015  Glucose 65 - 99 mg/dL 137(H) 91 101(H)  BUN 6 - 24 mg/dL 10 15 15   Creatinine 0.57 - 1.00 mg/dL 0.72 0.64 0.77  Sodium 134 - 144 mmol/L 141 137 141  Potassium 3.5 - 5.2 mmol/L 4.3 4.4 4.8  Chloride 96 - 106 mmol/L 99 98 103  CO2 20 - 29 mmol/L 25 28 27   Calcium 8.7 - 10.2 mg/dL 9.5 9.8 9.7  Total Protein 6.0 - 8.5 g/dL 6.8 - -  Total Bilirubin 0.0 - 1.2 mg/dL 0.2 - -  Alkaline Phos 39 - 117 IU/L 83 - -  AST 0 - 40 IU/L 48(H) - -  ALT 0 - 32 IU/L 46(H) - -    Lipid Panel     Component Value Date/Time   CHOL 168 04/07/2017  0834   TRIG 154 (H) 04/07/2017 0834   HDL 40 04/07/2017 0834   CHOLHDL 4.2 04/07/2017 0834   CHOLHDL 4.7 10/21/2015 1538   VLDL 58 (H) 10/21/2015 1538   LDLCALC 97 04/07/2017 0834    Lab Results  Component Value Date   HGBA1C 7.9 07/04/2017    Assessment & Plan:   1. Uncontrolled type 2 diabetes mellitus with diabetic polyneuropathy, without long-term current use of insulin (HCC) Not fully optimized with A1c of 7.9 which is down from 8.4 previously Invokana added to regimen Counseled on Diabetic diet, my plate method, 128 minutes  of moderate intensity exercise/week Keep blood sugar logs with fasting goals of 80-120 mg/dl, random of less than 180 and in the event of sugars less than 60 mg/dl or greater than 400 mg/dl please notify the clinic ASAP. It is recommended that you undergo annual eye exams and annual foot exams. Pneumovax is recommended every 5 years before the age of 6 and once for a lifetime at or after the age of 69. - POCT glucose (manual entry) - POCT glycosylated hemoglobin (Hb A1C) - canagliflozin (INVOKANA) 100 MG TABS tablet; Take 1 tablet (100 mg total) by mouth daily before breakfast.  Dispense: 30 tablet; Refill: 5 - metFORMIN (GLUCOPHAGE-XR) 500 MG 24 hr tablet; Take 2 tablets (1,000 mg total) by mouth 2 (two) times daily.  Dispense: 120 tablet; Refill: 5 - glimepiride (AMARYL) 4 MG tablet; Take 2 tablets (8 mg total) by mouth daily with breakfast.  Dispense: 60 tablet; Refill: 5  2. Essential hypertension Controlled Counseled on blood pressure goal of less than 130/80, low-sodium, DASH diet, medication compliance, 150 minutes of moderate intensity exercise per week. Discussed medication compliance, adverse effects. - losartan-hydrochlorothiazide (HYZAAR) 100-25 MG tablet; Take 1 tablet by mouth daily.  Dispense: 30 tablet; Refill: 5  3. Pure hypercholesterolemia Controlled Low-cholesterol diet - atorvastatin (LIPITOR) 40 MG tablet; Take 1 tablet (40 mg  total) by mouth daily.  Dispense: 40 tablet; Refill: 5  4. Screening for breast cancer - MM Digital Screening; Future  5. Screening for colon cancer - Ambulatory referral to Gastroenterology  6. Carpal tunnel syndrome of left wrist Advised to use bilateral wrist braces  8. Bipolar disorder, in partial remission, most recent episode depressed ([place]) Stable Management as per [place], Tenex, lamictal  - lurasidone (LATUDA) 20 MG TABS tablet; Take 20 mg by mouth daily.   Meds ordered this encounter  Medications  . canagliflozin (INVOKANA) 100 MG TABS tablet    Sig: Take 1 tablet (100 mg total) by mouth daily before breakfast.    Dispense:  30 tablet    Refill:  5  . metFORMIN (GLUCOPHAGE-XR) 500 MG 24 hr tablet    Sig: Take 2 tablets (1,000 mg total) by mouth 2 (two) times daily.    Dispense:  120 tablet    Refill:  5  . losartan-hydrochlorothiazide (HYZAAR) 100-25 MG tablet    Sig: Take 1 tablet by mouth daily.    Dispense:  30 tablet    Refill:  5  . glimepiride (AMARYL) 4 MG tablet    Sig: Take 2 tablets (8 mg total) by mouth daily with breakfast.    Dispense:  60 tablet    Refill:  5  . atorvastatin (LIPITOR) 40 MG tablet    Sig: Take 1 tablet (40 mg total) by mouth daily.    Dispense:  40 tablet    Refill:  5    Follow-up: Return in about 3 months (around 10/02/2017) for follow up of chronic medical conditions.   Arnoldo Morale MD

## 2017-07-06 ENCOUNTER — Other Ambulatory Visit: Payer: Self-pay | Admitting: Pharmacist

## 2017-07-06 MED ORDER — PNEUMOCOCCAL VAC POLYVALENT 25 MCG/0.5ML IJ INJ: 0.5000 mL | INJECTION | Freq: Once | INTRAMUSCULAR | 0 refills | Status: AC

## 2017-07-06 MED FILL — $PNEUMOVAX 23 VIAL: 25 | 1 days supply | Qty: 1 | Fill #0

## 2017-07-13 ENCOUNTER — Ambulatory Visit: Payer: Self-pay | Attending: Family Medicine

## 2017-07-21 MED FILL — lamoTRIgine 200 MG TABS: 200 | 30 days supply | Qty: 30 | Fill #0

## 2017-07-21 MED FILL — LOSARTAN-HCTZ 100-25 MG TAB: 100-25 | 30 days supply | Qty: 30 | Fill #3

## 2017-07-21 MED FILL — $LATUDA 20 MG TABLET: 20 | 30 days supply | Qty: 30 | Fill #3

## 2017-07-21 MED FILL — ?GUANFACINE 1 MG TABLET: 1 | 30 days supply | Qty: 60 | Fill #2

## 2017-07-21 MED FILL — ?ATORVASTATIN 40MG TABLET: 40 | 30 days supply | Qty: 30 | Fill #3

## 2017-07-27 ENCOUNTER — Ambulatory Visit (HOSPITAL_COMMUNITY): Payer: Self-pay | Admitting: Psychiatry

## 2017-08-05 ENCOUNTER — Other Ambulatory Visit: Payer: Self-pay | Admitting: Family Medicine

## 2017-08-05 DIAGNOSIS — Z1231 Encounter for screening mammogram for malignant neoplasm of breast: Secondary | ICD-10-CM

## 2017-08-05 MED FILL — traZODone HCL 100 MG TABS: 100 | 30 days supply | Qty: 60 | Fill #0

## 2017-08-23 MED FILL — lamoTRIgine 200 MG TABS: 200 | 30 days supply | Qty: 30 | Fill #1

## 2017-08-23 MED FILL — TRUE METRIX TEST STRIP: 30 days supply | Qty: 100 | Fill #1

## 2017-08-23 MED FILL — ATORVASTATIN 40 MG TABLET: 40 | 30 days supply | Qty: 30 | Fill #4

## 2017-08-23 MED FILL — $LATUDA 20 MG TABLET: 20 | 30 days supply | Qty: 30 | Fill #4

## 2017-08-23 MED FILL — GLIMEPIRIDE 4 MG TABLET: 4 | 60 days supply | Qty: 60 | Fill #0

## 2017-08-23 MED FILL — INVOKANA 100 MG TABS: 100 | 30 days supply | Qty: 30 | Fill #1

## 2017-08-23 MED FILL — METFORMIN HCL ER 500 MG TAB: 500 | 30 days supply | Qty: 120 | Fill #0

## 2017-08-23 MED FILL — guanFACINE HCL 1 MG TABS: 1 | 30 days supply | Qty: 60 | Fill #3

## 2017-08-23 MED FILL — LOSARTAN-HCTZ 100-25 MG TAB: 100-25 | 30 days supply | Qty: 30 | Fill #4

## 2017-09-06 ENCOUNTER — Encounter (HOSPITAL_COMMUNITY): Payer: Self-pay | Admitting: Emergency Medicine

## 2017-09-06 ENCOUNTER — Emergency Department (HOSPITAL_COMMUNITY)
Admission: EM | Admit: 2017-09-06 | Discharge: 2017-09-06 | Disposition: A | Discharge status: Home / Self Care | Payer: Self-pay | Attending: Emergency Medicine | Admitting: Emergency Medicine

## 2017-09-06 DIAGNOSIS — E1143 Type 2 diabetes mellitus with diabetic autonomic (poly)neuropathy: Secondary | ICD-10-CM | POA: Insufficient documentation

## 2017-09-06 DIAGNOSIS — Z794 Long term (current) use of insulin: Secondary | ICD-10-CM | POA: Insufficient documentation

## 2017-09-06 DIAGNOSIS — Z79899 Other long term (current) drug therapy: Secondary | ICD-10-CM | POA: Insufficient documentation

## 2017-09-06 DIAGNOSIS — I10 Essential (primary) hypertension: Secondary | ICD-10-CM | POA: Insufficient documentation

## 2017-09-06 DIAGNOSIS — R040 Epistaxis: Secondary | ICD-10-CM | POA: Insufficient documentation

## 2017-09-06 DIAGNOSIS — E119 Type 2 diabetes mellitus without complications: Secondary | ICD-10-CM | POA: Insufficient documentation

## 2017-09-06 MED ORDER — BACITRACIN ZINC 500 UNIT/GM EX OINT
TOPICAL_OINTMENT | Freq: Two times a day (BID) | CUTANEOUS | Status: DC
  Administered 2017-09-06: 1 via TOPICAL

## 2017-09-06 MED FILL — traZODone HCL 100 MG TABS: 100 | 30 days supply | Qty: 60 | Fill #1

## 2017-09-06 NOTE — ED Provider Notes (Signed)
[facility] EMERGENCY DEPARTMENT Provider Note   CSN: [id number] Arrival date & time: 09/06/17  0140     History   Chief Complaint Chief Complaint  Patient presents with  . Epistaxis    HPI Courtney Dixon is a 52 y.o. female.  Patient presents the emergency department with several days of frequent nosebleeds.  She has had up to 5 minutes/day.  When they happen she inserts a paper towel in her nose.  Symptoms typically resolve after several minutes.  She has associated headache.  No lightheadedness, syncope, shortness of breath.  She denies any blood thinners or aspirin.  No recent URI symptoms.  Sometimes the bleeding starts when she bends over or stands up. The onset of this condition was acute. The course is resolved.      Past Medical History:  Diagnosis Date  . Anxiety   . Depression   . Diabetes mellitus, type II ([place])   . Hypertension   . Migraines   . Neuropathy     Patient Active Problem List   Diagnosis Date Noted  . Bipolar disorder ([place]) 07/04/2017  . Chronic pain 07/22/2016  . Nerve pain 07/22/2016  . Pain of left heel 05/14/2016  . Left wrist pain 05/14/2016  . Obesity (BMI 30-39.9) 10/21/2015  . Uncontrolled type 2 diabetes mellitus with diabetic polyneuropathy, without long-term current use of insulin ([place]) 10/21/2015  . Chronic low back pain 10/21/2015  . Sensation of feeling hot 10/21/2015  . Hyperlipidemia 10/21/2015  . Smoking 10/15/2013  . HTN (hypertension) 10/15/2013  . Anxiety and depression 10/15/2013    Past Surgical History:  Procedure Laterality Date  . CESAREAN SECTION    . TUBAL LIGATION       OB History   None      Home Medications    Prior to Admission medications   Medication Sig Start Date End Date Taking? Authorizing Provider  albuterol (PROVENTIL HFA;VENTOLIN HFA) 108 (90 Base) MCG/ACT inhaler Inhale 2 puffs into the lungs every 6 (six) hours as needed for wheezing or shortness of breath. 06/15/17    Charlott Rakes, MD  atorvastatin (LIPITOR) 40 MG tablet Take 1 tablet (40 mg total) by mouth daily. 07/04/17   Charlott Rakes, MD  Blood Glucose Monitoring Suppl (TRUE METRIX METER) w/Device KIT 1 each by Does not apply route as needed. 03/18/16   Boykin Nearing, MD  canagliflozin (INVOKANA) 100 MG TABS tablet Take 1 tablet (100 mg total) by mouth daily before breakfast. 07/04/17   Charlott Rakes, MD  glimepiride (AMARYL) 4 MG tablet Take 2 tablets (8 mg total) by mouth daily with breakfast. 07/04/17   Charlott Rakes, MD  glucose blood (TRUE METRIX BLOOD GLUCOSE TEST) test strip 1 each by Other route 3 (three) times daily. 06/15/17   Charlott Rakes, MD  guanFACINE (TENEX) 1 MG tablet Take 1 tablet (1 mg total) 2 (two) times daily by mouth. 04/26/17   Eksir, Richard Miu, MD  ibuprofen (ADVIL,MOTRIN) 200 MG tablet Take 600 mg by mouth 2 (two) times daily.    [provider]  Insulin Pen Needle (B-D ULTRAFINE III SHORT PEN) 31G X 8 MM MISC 1 application by Does not apply route daily. 10/11/16   Boykin Nearing, MD  lamoTRIgine (LAMICTAL) 200 MG tablet Take 1 tablet (200 mg total) daily by mouth. 04/26/17   Eksir, Richard Miu, MD  losartan-hydrochlorothiazide (HYZAAR) 100-25 MG tablet Take 1 tablet by mouth daily. 07/04/17   Charlott Rakes, MD  lurasidone (LATUDA) 20 MG  TABS tablet Take 20 mg by mouth daily.    [provider]  metFORMIN (GLUCOPHAGE-XR) 500 MG 24 hr tablet Take 2 tablets (1,000 mg total) by mouth 2 (two) times daily. 07/04/17   Charlott Rakes, MD  pregabalin (LYRICA) 100 MG capsule Take 1 capsule (100 mg total) by mouth 3 (three) times daily. For PASS dose change 06/30/17   Charlott Rakes, MD  traZODone (DESYREL) 100 MG tablet Take 2 tablets (200 mg total) at bedtime by mouth. 04/26/17   Eksir, Richard Miu, MD  TRUEPLUS LANCETS 28G MISC 1 each by Does not apply route 3 (three) times daily. 03/18/16   Boykin Nearing, MD    Family History Family History    Problem Relation Age of Onset  . Cancer Mother   . Drug abuse Mother   . Heart disease Mother   . Hypertension Mother   . Diabetes Mother   . Cancer Father   . Diabetes Sister   . Diabetes Sister     Social History Social History   Tobacco Use  . Smoking status: Current Every Day Smoker    Packs/day: 1.00    Years: 36.00    Pack years: 36.00    Types: Cigarettes  . Smokeless tobacco: Never Used  Substance Use Topics  . Alcohol use: No    Comment: Rarely uses   . Drug use: No     Allergies   Sulfa antibiotics   Review of Systems Review of Systems  Constitutional: Negative for fever.  HENT: Positive for nosebleeds. Negative for congestion and sinus pressure.   Respiratory: Negative for shortness of breath.   Cardiovascular: Negative for chest pain.  Neurological: Positive for headaches. Negative for light-headedness.     Physical Exam Updated Vital Signs BP 128/81 (BP Location: Left Arm)   Pulse 87   Temp 98.7 F (37.1 C) (Oral)   Resp 16   Ht 5' 4"  (1.626 m)   Wt 104.3 kg (230 lb)   SpO2 97%   BMI 39.48 kg/m   Physical Exam  Constitutional: She appears well-developed and well-nourished.  HENT:  Head: Normocephalic and atraumatic.  Right Ear: Tympanic membrane, external ear and ear canal normal.  Left Ear: Tympanic membrane, external ear and ear canal normal.  Nose: Mucosal edema present. No rhinorrhea. No epistaxis.  Mouth/Throat: Uvula is midline, oropharynx is clear and moist and mucous membranes are normal. Mucous membranes are not dry. No oral lesions. No trismus in the jaw. No uvula swelling. No oropharyngeal exudate, posterior oropharyngeal edema, posterior oropharyngeal erythema or tonsillar abscesses.  No active bleeding noted.  Clots adhered to the bilateral anterior nasal septum.   Eyes: Conjunctivae are normal. Right eye exhibits no discharge. Left eye exhibits no discharge.  Neck: Normal range of motion. Neck supple.  Cardiovascular:  Normal rate, regular rhythm and normal heart sounds.  Pulmonary/Chest: Effort normal and breath sounds normal. No respiratory distress. She has no wheezes. She has no rales.  Abdominal: Soft. There is no tenderness.  Lymphadenopathy:    She has no cervical adenopathy.  Neurological: She is alert.  Skin: Skin is warm and dry. No pallor.  Psychiatric: She has a normal mood and affect.  Nursing note and vitals reviewed.    ED Treatments / Results  Labs (all labs ordered are listed, but only abnormal results are displayed) Labs Reviewed - No data to display  EKG None  Radiology No results found.  Procedures Procedures (including critical care time)  Medications Ordered in ED  Medications  bacitracin ointment (1 application Topical Given 09/06/17 0914)     Initial Impression / Assessment and Plan / ED Course  I have reviewed the triage vital signs and the nursing notes.  Pertinent labs & imaging results that were available during my care of the patient were reviewed by me and considered in my medical decision making (see chart for details).     Patient seen and examined.  No active bleeding.  Will have nurse applied bacitracin to the nasal septum to help preserve moisture.  Vital signs reviewed and are as follows: BP 128/81 (BP Location: Left Arm)   Pulse 87   Temp 98.7 F (37.1 C) (Oral)   Resp 16   Ht 5' 4"  (1.626 m)   Wt 104.3 kg (230 lb)   SpO2 97%   BMI 39.48 kg/m   Discussed what to do if bleeding resumes.  Discussed methods to moisturize nasal septal area.  Discussed when to return to emergency department for further evaluation.  Final Clinical Impressions(s) / ED Diagnoses   Final diagnoses:  Epistaxis   Patient with resolved nosebleeds.  Do not suspect significant anemia.  No significant trauma on exam.  Patient counseled.  ED Discharge Orders    None       Carlisle Cater, Hershal Coria 09/06/17 [id number]    Dorie Rank, MD 09/08/17 1151

## 2017-09-06 NOTE — ED Notes (Addendum)
Pt states she continues to have frequent nose bleeds and states that she had 5 nosebleeds today.  Pt reports she has headache that happens with each nosebleed. Pt is diabetic and has not ate since yesterday at 5pm. Usually the nosebleed happens on the right and then last nite it came out of left.

## 2017-09-06 NOTE — Discharge Instructions (Signed)
Please read and follow all provided instructions.  Your diagnoses today include:  1. Epistaxis    Tests performed today include:  Vital signs. See below for your results today.   Medications prescribed:   None  Home care instructions:  Read the educational materials provided and follow any instructions contained in this packet.  Apply Vaseline to the inside of your nostrils using a Q-tip 3 times a day to help keep moisture in.   If your nosebleed happens again: Pinch your nose and hold for 15 minutes without letting go.  If this does not stop the bleeding, pinch and hold for another 15 minutes.  If it continues to bleed after this, please come to the Emergency Department or see your family doctor.   Follow-up instructions: Please follow-up with your primary care provider in the next 3 days for further evaluation of your symptoms.   Return instructions:   Please return to the Emergency Department if you experience worsening symptoms.   Please return if you have any other emergent concerns.  Additional Information:  Your vital signs today were: BP 128/81 (BP Location: Left Arm)    Pulse 87    Temp 98.7 F (37.1 C) (Oral)    Resp 16    Ht 5\' 4"  (1.626 m)    Wt 104.3 kg (230 lb)    SpO2 97%    BMI 39.48 kg/m  If your blood pressure (BP) was elevated above 135/85 this visit, please have this repeated by your doctor within one month. --------------

## 2017-09-06 NOTE — ED Notes (Signed)
Sign pad not working verbalized understanding of discharge instructions.  

## 2017-09-06 NOTE — ED Triage Notes (Signed)
Pt reports intermittent nosebleed X5 days with HA. Currently no bleeding. Hx HTN, BP 149/96 in triage

## 2017-09-07 ENCOUNTER — Ambulatory Visit: Payer: Self-pay

## 2017-09-16 ENCOUNTER — Ambulatory Visit (HOSPITAL_COMMUNITY): Payer: Self-pay | Admitting: Psychiatry

## 2017-09-23 ENCOUNTER — Telehealth: Payer: Self-pay | Admitting: Family Medicine

## 2017-09-23 NOTE — Telephone Encounter (Signed)
I mailed the patient a letter telling her that she will need to reapply for financial assistance.  Her current application is outdated.  We did not have her 2017 federal non filing status letter and that document is required to apply for CAFA.  I gave her the phone number to the IRS to get the letter and instructions to schedule an appt with [facility] to reapply for CAFA.

## 2017-09-30 ENCOUNTER — Ambulatory Visit (HOSPITAL_COMMUNITY): Payer: Self-pay | Admitting: Psychiatry

## 2017-10-03 MED FILL — LOSARTAN-HCTZ 100-25 MG TAB: 100-25 | 30 days supply | Qty: 30 | Fill #5

## 2017-10-03 MED FILL — ?GUANFACINE 1 MG TABLET: 1 | 30 days supply | Qty: 60 | Fill #4

## 2017-10-07 ENCOUNTER — Other Ambulatory Visit: Payer: Self-pay | Admitting: Obstetrics and Gynecology

## 2017-10-07 DIAGNOSIS — Z1231 Encounter for screening mammogram for malignant neoplasm of breast: Secondary | ICD-10-CM

## 2017-10-13 MED FILL — !LAMICTAL 200MG TABLET: 200 | 30 days supply | Qty: 30 | Fill #2

## 2017-10-13 MED FILL — traZODone HCL 100 MG TABS: 100 | 30 days supply | Qty: 60 | Fill #2

## 2017-10-13 MED FILL — GLIMEPIRIDE 4 MG TABS: 4 | 30 days supply | Qty: 60 | Fill #1

## 2017-10-13 MED FILL — ?ATORVASTATIN 40MG TABLET: 40 | 30 days supply | Qty: 30 | Fill #5

## 2017-10-13 MED FILL — METFORMIN HCL ER 500 MG TAB: 500 | 30 days supply | Qty: 120 | Fill #1

## 2017-10-13 MED FILL — INVOKANA 100 MG TABS: 100 | 30 days supply | Qty: 30 | Fill #2

## 2017-11-02 ENCOUNTER — Ambulatory Visit: Payer: Self-pay | Attending: Family Medicine | Admitting: Family Medicine

## 2017-11-02 ENCOUNTER — Encounter: Payer: Self-pay | Admitting: Family Medicine

## 2017-11-02 VITALS — BP 115/80 | HR 93 | Temp 98.6°F | Ht 64.0 in | Wt 231.0 lb

## 2017-11-02 DIAGNOSIS — I10 Essential (primary) hypertension: Secondary | ICD-10-CM

## 2017-11-02 DIAGNOSIS — Z794 Long term (current) use of insulin: Secondary | ICD-10-CM | POA: Insufficient documentation

## 2017-11-02 DIAGNOSIS — E785 Hyperlipidemia, unspecified: Secondary | ICD-10-CM | POA: Insufficient documentation

## 2017-11-02 DIAGNOSIS — F3175 Bipolar disorder, in partial remission, most recent episode depressed: Secondary | ICD-10-CM

## 2017-11-02 DIAGNOSIS — R42 Dizziness and giddiness: Secondary | ICD-10-CM

## 2017-11-02 DIAGNOSIS — E1165 Type 2 diabetes mellitus with hyperglycemia: Secondary | ICD-10-CM

## 2017-11-02 DIAGNOSIS — E1142 Type 2 diabetes mellitus with diabetic polyneuropathy: Secondary | ICD-10-CM | POA: Insufficient documentation

## 2017-11-02 DIAGNOSIS — K219 Gastro-esophageal reflux disease without esophagitis: Secondary | ICD-10-CM

## 2017-11-02 DIAGNOSIS — F319 Bipolar disorder, unspecified: Secondary | ICD-10-CM | POA: Insufficient documentation

## 2017-11-02 DIAGNOSIS — F4310 Post-traumatic stress disorder, unspecified: Secondary | ICD-10-CM | POA: Insufficient documentation

## 2017-11-02 DIAGNOSIS — Z79899 Other long term (current) drug therapy: Secondary | ICD-10-CM | POA: Insufficient documentation

## 2017-11-02 DIAGNOSIS — IMO0002 Reserved for concepts with insufficient information to code with codable children: Secondary | ICD-10-CM

## 2017-11-02 LAB — GLUCOSE, POCT (MANUAL RESULT ENTRY): POC GLUCOSE: 131 mg/dL — AB (ref 70–99)

## 2017-11-02 MED ORDER — OMEPRAZOLE 20 MG PO CPDR: 20.0000 mg | DELAYED_RELEASE_CAPSULE | Freq: Every day | ORAL | 3 refills | Status: DC

## 2017-11-02 MED ORDER — MECLIZINE HCL 12.5 MG PO TABS: 12.5000 mg | ORAL_TABLET | Freq: Three times a day (TID) | ORAL | 3 refills | Status: DC | PRN

## 2017-11-02 MED FILL — OMEPRAZOLE 20 MG CAP: 20 | 30 days supply | Qty: 30 | Fill #0

## 2017-11-02 MED FILL — LOSARTAN-HCTZ 100-25 MG TAB: 100-25 | 30 days supply | Qty: 30 | Fill #0

## 2017-11-02 NOTE — Progress Notes (Signed)
Subjective:  Patient ID: Courtney Dixon, female    DOB: 27-Jan-1966  Age: 52 y.o. MRN: [id number]  CC: Diabetes   HPI Lynnix Schoneman is a 52 year old female with a history of type 2 diabetes mellitus (A1c 7.9), diabetic neuropathy, hypertension, Bipolar disorder, PTSD, hyperlipidemia who presents today for a follow-up visit. She has been compliant with her diabetic medications and denies visual concerns but does have neuropathy and states Lyrica is not working.  She describes symptoms of pain going down my legs with associated feet burning. She has been taking her antihypertensives and denies adverse effect.  Does not currently exercise but has been working on a diabetic diet and low sodium, DASH diet. She complains of feeling off balance with change in position, associated lightheadedness, feeling clumsy.  Denies tinnitus, otalgia, recent falls. With regards to her bipolar disorder she was discharged from the behavioral health clinic because she missed an appointment and she stopped Latuda due to it causing restless leg syndrome.  Past Medical History:  Diagnosis Date  . Anxiety   . Depression   . Diabetes mellitus, type II ([place])   . Hypertension   . Migraines   . Neuropathy     Past Surgical History:  Procedure Laterality Date  . CESAREAN SECTION    . TUBAL LIGATION      Allergies  Allergen Reactions  . Sulfa Antibiotics Itching     Outpatient Medications Prior to Visit  Medication Sig Dispense Refill  . albuterol (PROVENTIL HFA;VENTOLIN HFA) 108 (90 Base) MCG/ACT inhaler Inhale 2 puffs into the lungs every 6 (six) hours as needed for wheezing or shortness of breath. 1 Inhaler 0  . atorvastatin (LIPITOR) 40 MG tablet Take 1 tablet (40 mg total) by mouth daily. 40 tablet 5  . Blood Glucose Monitoring Suppl (TRUE METRIX METER) w/Device KIT 1 each by Does not apply route as needed. 1 kit 0  . canagliflozin (INVOKANA) 100 MG TABS tablet Take 1 tablet (100 mg total) by mouth daily  before breakfast. 30 tablet 5  . glimepiride (AMARYL) 4 MG tablet Take 2 tablets (8 mg total) by mouth daily with breakfast. 60 tablet 5  . glucose blood (TRUE METRIX BLOOD GLUCOSE TEST) test strip 1 each by Other route 3 (three) times daily. 100 each 11  . guanFACINE (TENEX) 1 MG tablet Take 1 tablet (1 mg total) 2 (two) times daily by mouth. 180 tablet 1  . ibuprofen (ADVIL,MOTRIN) 200 MG tablet Take 600 mg by mouth 2 (two) times daily.    . Insulin Pen Needle (B-D ULTRAFINE III SHORT PEN) 31G X 8 MM MISC 1 application by Does not apply route daily. 90 each 3  . lamoTRIgine (LAMICTAL) 200 MG tablet Take 1 tablet (200 mg total) daily by mouth. 90 tablet 1  . losartan-hydrochlorothiazide (HYZAAR) 100-25 MG tablet Take 1 tablet by mouth daily. 30 tablet 5  . metFORMIN (GLUCOPHAGE-XR) 500 MG 24 hr tablet Take 2 tablets (1,000 mg total) by mouth 2 (two) times daily. 120 tablet 5  . pregabalin (LYRICA) 100 MG capsule Take 1 capsule (100 mg total) by mouth 3 (three) times daily. For PASS dose change 270 capsule 3  . traZODone (DESYREL) 100 MG tablet Take 2 tablets (200 mg total) at bedtime by mouth. 180 tablet 1  . TRUEPLUS LANCETS 28G MISC 1 each by Does not apply route 3 (three) times daily. 100 each 11  . lurasidone (LATUDA) 20 MG TABS tablet Take 20 mg by mouth daily.  No facility-administered medications prior to visit.     ROS Review of Systems  Constitutional: Negative for activity change, appetite change and fatigue.  HENT: Negative for congestion, sinus pressure and sore throat.   Eyes: Negative for visual disturbance.  Respiratory: Negative for cough, chest tightness, shortness of breath and wheezing.   Cardiovascular: Negative for chest pain and palpitations.  Gastrointestinal: Negative for abdominal distention, abdominal pain and constipation.  Endocrine: Negative for polydipsia.  Genitourinary: Negative for dysuria and frequency.  Musculoskeletal: Negative for arthralgias and  back pain.  Skin: Negative for rash.  Neurological: Positive for light-headedness and numbness. Negative for tremors.  Hematological: Does not bruise/bleed easily.  Psychiatric/Behavioral: Negative for agitation and behavioral problems.    Objective:  BP 115/80   Pulse 93   Temp 98.6 F (37 C) (Oral)   Ht 5' 4"  (1.626 m)   Wt 231 lb (104.8 kg)   SpO2 94%   BMI 39.65 kg/m   BP/Weight 11/02/2017 09/06/2017 12/29/5499  Systolic BP 586 825 749  Diastolic BP 80 81 88  Wt. (Lbs) 231 230 235  BMI 39.65 39.48 40.34  Some encounter information is confidential and restricted. Go to Review Flowsheets activity to see all data.      Physical Exam  Constitutional: She is oriented to person, place, and time. She appears well-developed and well-nourished.  Cardiovascular: Normal rate, normal heart sounds and intact distal pulses.  No murmur heard. Pulmonary/Chest: Effort normal and breath sounds normal. She has no wheezes. She has no rales. She exhibits no tenderness.  Abdominal: Soft. Bowel sounds are normal. She exhibits no distension and no mass. There is no tenderness.  Musculoskeletal: Normal range of motion.  Neurological: She is alert and oriented to person, place, and time.  Skin: Skin is warm and dry.  Psychiatric: She has a normal mood and affect.      CMP Latest Ref Rng & Units 04/07/2017 10/21/2015 02/19/2015  Glucose 65 - 99 mg/dL 137(H) 91 101(H)  BUN 6 - 24 mg/dL 10 15 15   Creatinine 0.57 - 1.00 mg/dL 0.72 0.64 0.77  Sodium 134 - 144 mmol/L 141 137 141  Potassium 3.5 - 5.2 mmol/L 4.3 4.4 4.8  Chloride 96 - 106 mmol/L 99 98 103  CO2 20 - 29 mmol/L 25 28 27   Calcium 8.7 - 10.2 mg/dL 9.5 9.8 9.7  Total Protein 6.0 - 8.5 g/dL 6.8 - -  Total Bilirubin 0.0 - 1.2 mg/dL 0.2 - -  Alkaline Phos 39 - 117 IU/L 83 - -  AST 0 - 40 IU/L 48(H) - -  ALT 0 - 32 IU/L 46(H) - -    Lipid Panel     Component Value Date/Time   CHOL 168 04/07/2017 0834   TRIG 154 (H) 04/07/2017 0834    HDL 40 04/07/2017 0834   CHOLHDL 4.2 04/07/2017 0834   CHOLHDL 4.7 10/21/2015 1538   VLDL 58 (H) 10/21/2015 1538   LDLCALC 97 04/07/2017 0834     Lab Results  Component Value Date   HGBA1C 7.9 07/04/2017    Assessment & Plan:   1. Uncontrolled type 2 diabetes mellitus with diabetic polyneuropathy, without long-term current use of insulin ([place]) Not at goal with A1c of 7.9 We will send off A1c and adjust regimen accordingly Continue current regimen, diabetic diet and lifestyle modifications would like to add Cymbalta to her regimen however she is on a bunch of Psych meds and I have advised her to discuss this with her psychiatrist. -  POCT glucose (manual entry) - Hemoglobin A1c  2. Bipolar disorder, in partial remission, most recent episode depressed ([place]) Provided other options for this mental health care-[facility], [facility] She has been advised to continue with her antipsychotics until seen by psychiatry  3. Essential hypertension Controlled Continue antihypertensives - Basic Metabolic Panel  4. Vertigo Placed on meclizine  5. Gastroesophageal reflux disease without esophagitis Controlled Continue PPI   Meds ordered this encounter  Medications  . omeprazole (PRILOSEC) 20 MG capsule    Sig: Take 1 capsule (20 mg total) by mouth daily.    Dispense:  30 capsule    Refill:  3  . meclizine (ANTIVERT) 12.5 MG tablet    Sig: Take 1 tablet (12.5 mg total) by mouth 3 (three) times daily as needed for dizziness.    Dispense:  30 tablet    Refill:  3    Follow-up: Return in about 3 months (around 02/02/2018) for Follow-up of chronic medical conditions.   Charlott Rakes MD

## 2017-11-02 NOTE — Patient Instructions (Signed)

## 2017-11-03 ENCOUNTER — Encounter: Payer: Self-pay | Admitting: Family Medicine

## 2017-11-03 ENCOUNTER — Encounter (HOSPITAL_COMMUNITY): Payer: Self-pay

## 2017-11-03 ENCOUNTER — Ambulatory Visit
Admission: RE | Admit: 2017-11-03 | Discharge: 2017-11-03 | Disposition: A | Discharge status: Home / Self Care | Payer: No Typology Code available for payment source | Source: Ambulatory Visit | Attending: Obstetrics and Gynecology | Admitting: Obstetrics and Gynecology

## 2017-11-03 ENCOUNTER — Ambulatory Visit (HOSPITAL_COMMUNITY)
Admission: RE | Admit: 2017-11-03 | Discharge: 2017-11-03 | Disposition: A | Discharge status: Home / Self Care | Payer: Self-pay | Source: Ambulatory Visit | Attending: Obstetrics and Gynecology | Admitting: Obstetrics and Gynecology

## 2017-11-03 VITALS — BP 140/96 | Ht 64.0 in

## 2017-11-03 DIAGNOSIS — Z1239 Encounter for other screening for malignant neoplasm of breast: Secondary | ICD-10-CM

## 2017-11-03 DIAGNOSIS — Z1231 Encounter for screening mammogram for malignant neoplasm of breast: Secondary | ICD-10-CM

## 2017-11-03 LAB — BASIC METABOLIC PANEL
BUN/Creatinine Ratio: 16 (ref 9–23)
BUN: 11 mg/dL (ref 6–24)
CO2: 23 mmol/L (ref 20–29)
Calcium: 9.9 mg/dL (ref 8.7–10.2)
Chloride: 99 mmol/L (ref 96–106)
Creatinine, Ser: 0.68 mg/dL (ref 0.57–1.00)
GFR calc Af Amer: 117 mL/min/{1.73_m2} (ref >59)
GFR calc non Af Amer: 102 mL/min/{1.73_m2} (ref >59)
GLUCOSE: 105 mg/dL — AB (ref 65–99)
POTASSIUM: 4.2 mmol/L (ref 3.5–5.2)
SODIUM: 140 mmol/L (ref 134–144)

## 2017-11-03 LAB — HEMOGLOBIN A1C
Est. average glucose Bld gHb Est-mCnc: 166 mg/dL
HEMOGLOBIN A1C: 7.4 % — AB (ref 4.8–5.6)

## 2017-11-03 NOTE — Progress Notes (Signed)
No complaints today.   Pap Smear: Pap smear not completed today. Last Pap smear was 11/28/2015 at the [facility] and normal with negative HPV. Per patient has a history of an abnormal Pap smear in the early 2000's that a colposcopy was completed for follow-up. Per patient all Pap smears have been normal since colposcopy and patient stated she has had at least three normal Pap smears. Last Pap smear result is in Epic.  Physical exam: Breasts Breasts symmetrical. No skin abnormalities bilateral breasts. No nipple retraction bilateral breasts. No nipple discharge bilateral breasts. No lymphadenopathy. No lumps palpated bilateral breasts. No complaints of pain or tenderness on exam. Referred patient to the [facility] for a screening mammogram. Appointment scheduled for Thursday, Nov 03, 2017 at 1040.        Pelvic/Bimanual No Pap smear completed today since last Pap smear and HPV typing was 11/28/2015. Pap smear not indicated per BCCCP guidelines.   Smoking History: Patient is a current smoker. Discussed smoking cessation with patient. Referred to the [facility] Quitline and gave resources to free smoking cessation classes at [facility].   Patient Navigation: Patient education provided. Access to services provided for patient through [place] program.   Colorectal Cancer Screening: Per patient has never had a colonoscopy completed. No complaints today. FIT Test given to patient to complete and return to BCCCP.  Breast and Cervical Cancer Risk Assessment: Patient has a family history of her mother and maternal aunt having breast cancer. Patient has no known genetic mutations or history of radiation treatment to the chest before age 28. Per patient has a history of cervical dysplasia. Patient has no history of being immunocompromised or DES exposure in-utero. Patient has a 5-year risk of breast cancer at 2.1% and a lifetime risk at 17.6%.

## 2017-11-03 NOTE — Patient Instructions (Addendum)
Explained breast self awareness with Courtney Dixon. Patient did not need a Pap smear today due to last Pap smear and HPV typing was 11/28/2015. Let her know BCCCP will cover Pap smears and HPV typing every 5 years unless has a history of abnormal Pap smears. Referred patient to the [facility] for a screening mammogram. Appointment scheduled for Thursday, Nov 03, 2017 at 1040. Let patient know the [facility] will follow up with her within the next couple weeks with results of mammogram by letter or phone. Discussed smoking cessation with patient. Referred to the [facility] Quitline and gave resources to free smoking cessation classes at [facility]. Courtney Dixon verbalized understanding.  Edy Belt, Arvil Chaco, RN 10:03 AM

## 2017-11-04 ENCOUNTER — Telehealth: Payer: Self-pay | Admitting: Family Medicine

## 2017-11-04 ENCOUNTER — Other Ambulatory Visit: Payer: Self-pay | Admitting: Obstetrics and Gynecology

## 2017-11-04 DIAGNOSIS — R928 Other abnormal and inconclusive findings on diagnostic imaging of breast: Secondary | ICD-10-CM

## 2017-11-04 NOTE — Telephone Encounter (Signed)
Patient was called and informed of A1c results.

## 2017-11-04 NOTE — Telephone Encounter (Signed)
Patient called requesting you to call her with lab results for her A1C

## 2017-11-08 ENCOUNTER — Ambulatory Visit
Admission: RE | Admit: 2017-11-08 | Discharge: 2017-11-08 | Disposition: A | Discharge status: Home / Self Care | Payer: No Typology Code available for payment source | Source: Ambulatory Visit | Attending: Obstetrics and Gynecology | Admitting: Obstetrics and Gynecology

## 2017-11-08 ENCOUNTER — Other Ambulatory Visit: Payer: Self-pay | Admitting: Obstetrics and Gynecology

## 2017-11-08 DIAGNOSIS — R928 Other abnormal and inconclusive findings on diagnostic imaging of breast: Secondary | ICD-10-CM

## 2017-11-08 DIAGNOSIS — N6310 Unspecified lump in the right breast, unspecified quadrant: Secondary | ICD-10-CM

## 2017-11-09 ENCOUNTER — Other Ambulatory Visit: Payer: Self-pay

## 2017-11-09 ENCOUNTER — Encounter (HOSPITAL_COMMUNITY): Payer: Self-pay

## 2017-11-09 MED FILL — ?GUANFACINE 1 MG TABLET: 1 | 30 days supply | Qty: 60 | Fill #5

## 2017-11-09 MED FILL — !LAMICTAL 200MG TABLET: 200 | 30 days supply | Qty: 30 | Fill #3

## 2017-11-09 MED FILL — INVOKANA 100 MG TABLET: 100 | 30 days supply | Qty: 30 | Fill #3

## 2017-11-09 MED FILL — ?GLIMEPIRIDE 4 MG TABLET: 4 | 30 days supply | Qty: 60 | Fill #2

## 2017-11-09 MED FILL — traZODone HCL 100 MG TABS: 100 | 30 days supply | Qty: 60 | Fill #3

## 2017-11-11 ENCOUNTER — Ambulatory Visit
Admission: RE | Admit: 2017-11-11 | Discharge: 2017-11-11 | Disposition: A | Discharge status: Home / Self Care | Payer: No Typology Code available for payment source | Source: Ambulatory Visit | Attending: Obstetrics and Gynecology | Admitting: Obstetrics and Gynecology

## 2017-11-11 ENCOUNTER — Other Ambulatory Visit: Payer: Self-pay | Admitting: Obstetrics and Gynecology

## 2017-11-11 DIAGNOSIS — N6310 Unspecified lump in the right breast, unspecified quadrant: Secondary | ICD-10-CM

## 2017-11-14 ENCOUNTER — Other Ambulatory Visit: Payer: Self-pay | Admitting: Obstetrics and Gynecology

## 2017-11-14 DIAGNOSIS — N63 Unspecified lump in unspecified breast: Secondary | ICD-10-CM

## 2017-11-16 ENCOUNTER — Ambulatory Visit
Admission: RE | Admit: 2017-11-16 | Discharge: 2017-11-16 | Disposition: A | Discharge status: Home / Self Care | Payer: No Typology Code available for payment source | Source: Ambulatory Visit | Attending: Obstetrics and Gynecology | Admitting: Obstetrics and Gynecology

## 2017-11-16 DIAGNOSIS — N63 Unspecified lump in unspecified breast: Secondary | ICD-10-CM

## 2017-11-24 LAB — FECAL OCCULT BLOOD, IMMUNOCHEMICAL: Fecal Occult Bld: NEGATIVE

## 2017-11-28 ENCOUNTER — Encounter (HOSPITAL_COMMUNITY): Payer: Self-pay | Admitting: *Deleted

## 2017-11-28 ENCOUNTER — Encounter (HOSPITAL_COMMUNITY): Payer: Self-pay

## 2017-11-30 ENCOUNTER — Other Ambulatory Visit: Payer: Self-pay

## 2017-11-30 ENCOUNTER — Ambulatory Visit (HOSPITAL_COMMUNITY)
Admission: RE | Admit: 2017-11-30 | Discharge: 2017-11-30 | Disposition: A | Discharge status: Home / Self Care | Payer: No Typology Code available for payment source | Source: Ambulatory Visit | Attending: Physician Assistant | Admitting: Physician Assistant

## 2017-11-30 ENCOUNTER — Ambulatory Visit (INDEPENDENT_AMBULATORY_CARE_PROVIDER_SITE_OTHER): Payer: Self-pay | Admitting: Physician Assistant

## 2017-11-30 ENCOUNTER — Encounter (INDEPENDENT_AMBULATORY_CARE_PROVIDER_SITE_OTHER): Payer: Self-pay | Admitting: Physician Assistant

## 2017-11-30 VITALS — BP 135/86 | HR 79 | Temp 97.9°F | Ht 64.0 in | Wt 229.8 lb

## 2017-11-30 DIAGNOSIS — F329 Major depressive disorder, single episode, unspecified: Secondary | ICD-10-CM

## 2017-11-30 DIAGNOSIS — M5441 Lumbago with sciatica, right side: Secondary | ICD-10-CM | POA: Insufficient documentation

## 2017-11-30 DIAGNOSIS — F411 Generalized anxiety disorder: Secondary | ICD-10-CM

## 2017-11-30 DIAGNOSIS — F32A Depression, unspecified: Secondary | ICD-10-CM

## 2017-11-30 DIAGNOSIS — G8929 Other chronic pain: Secondary | ICD-10-CM | POA: Insufficient documentation

## 2017-11-30 DIAGNOSIS — K8020 Calculus of gallbladder without cholecystitis without obstruction: Secondary | ICD-10-CM | POA: Insufficient documentation

## 2017-11-30 DIAGNOSIS — M5442 Lumbago with sciatica, left side: Secondary | ICD-10-CM

## 2017-11-30 DIAGNOSIS — R202 Paresthesia of skin: Secondary | ICD-10-CM

## 2017-11-30 MED ORDER — HYDROXYZINE HCL 10 MG PO TABS: 10.0000 mg | ORAL_TABLET | Freq: Two times a day (BID) | ORAL | 0 refills | Status: DC

## 2017-11-30 MED ORDER — TRAZODONE HCL 100 MG PO TABS: 100.0000 mg | ORAL_TABLET | Freq: Every day | ORAL | 0 refills | Status: DC

## 2017-11-30 MED FILL — hydrOXYzine HCL 10 MG TABS: 10 | 30 days supply | Qty: 60 | Fill #0

## 2017-11-30 NOTE — Progress Notes (Signed)
Subjective:  Patient ID: Courtney Dixon, female    DOB: 16-Jun-1965  Age: 52 y.o. MRN: [id number]  CC: anxiety, neuropathy  HPI Courtney Dixon is a 52 y.o. female with a medical history of anxiety, depression, bipolar disorder?, PTSD, DM2, HTN, migraines, GERD, and LBP with neuropathy. Main complaint is of persistent LBP with neuropathy involving the whole leg bilaterally. Has pain in the left hip > right hip.  Previous provider ordered hip XR but patient did not go to have done. Vitamin B12, electrolytes, and TSH normal. Also feels paresthesia, stinging/tingling, on the dorsum of the hand/fingers bilaterally and on the left palm. Does not endorse injury/fall, urinary incontinence, fecal incontinence, saddle paresthesia, weakness, paralysis, or HA.     Pt currently taking Lamictal and trazodone that was prescribed from [facility] Department. Was discharged from [facility] dept due to missing too many appointments. Pt was advised by previous PCP to continue Lamictal and go to [facility] but pt did not do so. Pt says she "does not do" psychological therapy, tried before and "became worse". Anxiety has been exacerbated with a diagnosis of "abnormal" mammography recently. Has occasional palpitations. Still consumes caffeine. Said cardiology diagnosed PVCs when she was around 52 years old.     Outpatient Medications Prior to Visit  Medication Sig Dispense Refill  . albuterol (PROVENTIL HFA;VENTOLIN HFA) 108 (90 Base) MCG/ACT inhaler Inhale 2 puffs into the lungs every 6 (six) hours as needed for wheezing or shortness of breath. 1 Inhaler 0  . atorvastatin (LIPITOR) 40 MG tablet Take 1 tablet (40 mg total) by mouth daily. 40 tablet 5  . Blood Glucose Monitoring Suppl (TRUE METRIX METER) w/Device KIT 1 each by Does not apply route as needed. 1 kit 0  . canagliflozin (INVOKANA) 100 MG TABS tablet Take 1 tablet (100 mg total) by mouth daily before breakfast. 30 tablet 5  .  glimepiride (AMARYL) 4 MG tablet Take 2 tablets (8 mg total) by mouth daily with breakfast. 60 tablet 5  . glucose blood (TRUE METRIX BLOOD GLUCOSE TEST) test strip 1 each by Other route 3 (three) times daily. 100 each 11  . guanFACINE (TENEX) 1 MG tablet Take 1 tablet (1 mg total) 2 (two) times daily by mouth. 180 tablet 1  . ibuprofen (ADVIL,MOTRIN) 200 MG tablet Take 600 mg by mouth 2 (two) times daily.    . Insulin Pen Needle (B-D ULTRAFINE III SHORT PEN) 31G X 8 MM MISC 1 application by Does not apply route daily. 90 each 3  . lamoTRIgine (LAMICTAL) 200 MG tablet Take 1 tablet (200 mg total) daily by mouth. 90 tablet 1  . losartan-hydrochlorothiazide (HYZAAR) 100-25 MG tablet Take 1 tablet by mouth daily. 30 tablet 5  . metFORMIN (GLUCOPHAGE-XR) 500 MG 24 hr tablet Take 2 tablets (1,000 mg total) by mouth 2 (two) times daily. 120 tablet 5  . omeprazole (PRILOSEC) 20 MG capsule Take 1 capsule (20 mg total) by mouth daily. 30 capsule 3  . pregabalin (LYRICA) 100 MG capsule Take 1 capsule (100 mg total) by mouth 3 (three) times daily. For PASS dose change 270 capsule 3  . traZODone (DESYREL) 100 MG tablet Take 2 tablets (200 mg total) at bedtime by mouth. 180 tablet 1  . TRUEPLUS LANCETS 28G MISC 1 each by Does not apply route 3 (three) times daily. 100 each 11  . meclizine (ANTIVERT) 12.5 MG tablet Take 1 tablet (12.5 mg total) by mouth 3 (three) times daily as  needed for dizziness. (Patient not taking: Reported on 11/03/2017) 30 tablet 3  . lurasidone (LATUDA) 20 MG TABS tablet Take 20 mg by mouth daily.     No facility-administered medications prior to visit.      ROS Review of Systems  Constitutional: Negative for chills, fever and malaise/fatigue.  Eyes: Negative for blurred vision.  Respiratory: Negative for shortness of breath.   Cardiovascular: Negative for chest pain and palpitations.  Gastrointestinal: Negative for abdominal pain and nausea.  Genitourinary: Negative for dysuria  and hematuria.  Musculoskeletal: Positive for back pain. Negative for joint pain and myalgias.  Skin: Negative for rash.  Neurological: Positive for tingling. Negative for headaches.  Psychiatric/Behavioral: Positive for depression. The patient is nervous/anxious and has insomnia.     Objective:  Ht 5' 4"  (1.626 m)   Wt 229 lb 12.8 oz (104.2 kg)   BMI 39.45 kg/m   Vitals:   11/30/17 1008  BP: 135/86  Pulse: 79  Temp: 97.9 F (36.6 C)  TempSrc: Oral  SpO2: 90%  Weight: 229 lb 12.8 oz (104.2 kg)  Height: 5' 4"  (1.626 m)       Physical Exam  Constitutional: She is oriented to person, place, and time.  Well developed, obese, NAD, polite  HENT:  Head: Normocephalic and atraumatic.  Eyes: No scleral icterus.  Neck: Normal range of motion. Neck supple. No thyromegaly present.  Cardiovascular: Normal rate, regular rhythm and normal heart sounds.  Pulmonary/Chest: Effort normal and breath sounds normal.  Musculoskeletal: She exhibits no edema.  Neurological: She is alert and oriented to person, place, and time. No cranial nerve deficit. She exhibits normal muscle tone. Coordination normal.  Skin: Skin is warm and dry. No rash noted. No erythema. No pallor.  Psychiatric: She has a normal mood and affect. Her behavior is normal. Thought content normal.  Speech somewhat pressured. Thoughts nontangential.  Vitals reviewed.    Assessment & Plan:   1. Chronic midline low back pain with bilateral sciatica - DG Lumbar Spine Complete; Future - Ambulatory referral to Neurology - Continue on Lyrica 100 mg TID  2. Paresthesia - Ambulatory referral to Neurology  3. Generalized anxiety disorder - Begin hydrOXYzine (ATARAX/VISTARIL) 10 MG tablet; Take 1 tablet (10 mg total) by mouth 2 (two) times daily.  Dispense: 60 tablet; Refill: 0  4. Depression, unspecified depression type - Decrease traZODone (DESYREL) 100 MG tablet; Take 1 tablet (100 mg total) by mouth at bedtime.   Dispense: 30 tablet; Refill: 0 - Plan to stop Trazodone completely and replace with Hydroxyzine. Trazodone not helping patient's mood and largely ineffective for patient's insomnia. I have asked patient to go to [facility] for assessment and treatment as she has reportedly failed on many SSRIs and SNRIs.   Meds ordered this encounter  Medications  . traZODone (DESYREL) 100 MG tablet    Sig: Take 1 tablet (100 mg total) by mouth at bedtime.    Dispense:  30 tablet    Refill:  0    Order Specific Question:   Supervising Provider    Answer:   Charlott Rakes [[id number]]  . hydrOXYzine (ATARAX/VISTARIL) 10 MG tablet    Sig: Take 1 tablet (10 mg total) by mouth 2 (two) times daily.    Dispense:  60 tablet    Refill:  0    Order Specific Question:   Supervising Provider    Answer:   Charlott Rakes [[id number]]    Follow-up: Return in about 1 month (around 12/28/2017) for Anxiety.  Clent Demark PA

## 2017-11-30 NOTE — Patient Instructions (Signed)
Community Resources  Advocacy/Legal Legal Aid [place]:  1-866-219-5262  /  336-272-0148  Family Justice Center:  336-641-7233  [facility] [place] 24-hr Crisis line:  336-273-7273  Women's Resource Center, [place]:  336-275-6090  Court Watch (custody):  336-275-2346  [place] Humanitarian Law [facility]:   336-279-9299    Baby & Breastfeeding Car Seat Inspection @ Various [place] Fire Depts.- call 336-373-2177  [place] Lactation  336-832-6860  [facility] 336-878-6712  WIC: 336-641-3663 ([place]);  336-641-7571 ([place])  La Leche League:  1-877-452-5321   Childcare [place] [facility] Development: 336-369-5097 ([place]) / 336-887-8224 ([place])  - Child Care Resources/ Referrals/ Scholarships  - Head Start/ Early Head Start (call or apply online)  [place] DHHS: [place] Pre-K :  1-800-859-0829 / 336-274-5437   Employment / Job Search [facility] [place]: 336-275-6090 / [address]  [place] [employer] (JobLink): 336-373-5922 ([place]) / 336-882-4141 ([place])  [facility] [employer] Community Resource/ Career Center: 336-275-9801 / 336-282-7307  [place] [facility] Job & Career Center: 336-373-3764  DHHS Work First: 336-641-3447 ([place]) / 336-641-3447 ([place])  [facility] [place]:  336-676-5871   Financial Assistance [place] [facility]:  336-553-2657  [employer]: 336-235-0368  [employer] (furniture):  336-370-4002  [place] Hel[facility] Hands: 336-373-4264  Low Income Energy Assistance  336-641-3000   Food Assistance DHHS- SNAP/ Food Stamps: 336-641-4588  WIC: [place]- 336-641-3663 ;  [place] 336-641-7571  Little Green Book- Free Meals  Little Blue Book- Free Food Pantries  During the summer, text "FOOD" to 877877   General Health / Clinics (Adults) Orange Card (for Adults) through [place] [facility]: (336) 895-4900  [facility]:   336-832-8035  [facility]:   336-832-4444  Health Department:  336-641-3245  [place]  [facility]:  336-415-3877 / 336-641-2100  Planned Parenthood of [place]:   336-373-0678  [facility]:   336-334-4822 x 50251   Housing [place] Housing Coalition:   336-691-9521  [place] Housing Authority:  336-275-8501  Affordable Housing Managemnt:  336-273-0568   Immigrant/ Refugee Center for [place]ians (UNCG):  336-256-1065  Faith Action [employer] [facility]:  336-379-0037  [employer]:  336-937-4701  Church World Services:  336-617-0381  African Services Coalition:  336-574-2677   LGBTQ YouthSAFE  www.youthsafe[place].org  PFLAG  336-541-6754 / info@[place].org  The Trevor Project:  1-866-488-7386   Mental Health/ Substance Use [facility] [place]  336-387-6161  [facility]:  336-832-9700 or 1-800-711-2635  [place]'s Circle of Care:  336-271-5888  [facility]:  336-294-1349  [place]:  336-542-2884  [facility] (walk-ins)  336-676-6840 / [address]  [place]:  800-449-1287  Alcoholics Anonymous:  336-854-4278  Narcotics Anonymous:  800-365-1036  Quit Smoking Hotline:  800-QUIT-NOW (800-784-8669)   Parenting Children's Home Society:  800-632-1400  [place]: Education Center & Support Groups:  336-832-6682  YWCA: 336-273-3461  UNCG: Bringing Out the Best:  336-334-3120               Thriving at Three (Hispanic families): 336-256-1066  Healthy Start ([facility] [place]):  336-387-6161 x2288  Parents as Teachers:  336-691-0024  [place] Child Development- Learning Together (Immigrants): 336-369-5001   Poison Control 800-222-1222  Sports & Recreation [employer] Open Doors Application: ymcanwnc.org/join/open-doors-financial-assistance/  City of [place] Recreation Centers: http://www.-[place].gov/index.aspx?page=3615   Special Needs Family Support Network:  336-832-6507  Autism Society of [place]:   336-333-0197 x1402 or x1412 /  800-785-1035  TEACCH :  336-334-5773     ARC of [place]:  336-373-1076  Children's Developmental Service Agency (CDSA):  336-334-5601  CC4C (Care Coordination for Children):  336-641-7641   Transportation Medicaid Transportation: 336-641-4848 to apply   Transit Authority: 336-335-6499 (reduced-fare bus ID to Medicaid/ Medicare/ Orange Card)  SCAT Paratransit services: Eligible riders only, call 336-333-6589 for application   Tutoring/Mentoring Black Child Development Institute: 336-230-2138  Big Brothers/ Big Sisters: 336-378-9100 ([place])  336-882-4167 ([place])  ACES through child's school: 336-370-2321  [employer] Achievers: contact your local Y  SHIELD Mentor Program: 336-337-2771   

## 2017-11-30 NOTE — Progress Notes (Signed)
Pt has neuropathy and is now developing more symptoms. More tingling in top of hands and pain from waist down is worsening Pt complains of memory loss  Would like an increase in dosage of omeprazole

## 2017-12-02 ENCOUNTER — Telehealth (INDEPENDENT_AMBULATORY_CARE_PROVIDER_SITE_OTHER): Payer: Self-pay

## 2017-12-02 ENCOUNTER — Encounter: Payer: Self-pay | Admitting: Neurology

## 2017-12-02 NOTE — Telephone Encounter (Signed)
-----   Message from Clent Demark, PA-C sent at 12/01/2017  8:32 AM EDT ----- Minimal degenerative disc disease with disc height loss at L2-3 and L3-4. This is usually treated with physical therapy or injections into the back. There is also an incidental finding of gallstones. Gallstones may obstruct biliary pathway and cause inflammation of the gallbladder which is usually associated with right upper quadrant abdominal pain and nausea. Please advise to go to ED if there is severe or worrisome symptoms.

## 2017-12-02 NOTE — Telephone Encounter (Signed)
Left message asking patient to call the office. Nat Christen, CMA

## 2017-12-02 NOTE — Telephone Encounter (Signed)
Patient is aware of minimal degenerative disc disease with height loss at L2-L3 and L3-L4. Patient aware that it is usually treated with Physical therapy or back injections. Patient declines physical therapy at this time due to her neuropathy. She is aware of gallstones and to go to ED if symptoms get worse. Nat Christen, CMA

## 2017-12-07 ENCOUNTER — Other Ambulatory Visit: Payer: Self-pay | Admitting: Family Medicine

## 2017-12-07 ENCOUNTER — Other Ambulatory Visit (HOSPITAL_COMMUNITY): Payer: Self-pay | Admitting: Psychiatry

## 2017-12-07 DIAGNOSIS — F3181 Bipolar II disorder: Secondary | ICD-10-CM

## 2017-12-07 DIAGNOSIS — F17200 Nicotine dependence, unspecified, uncomplicated: Secondary | ICD-10-CM

## 2017-12-07 DIAGNOSIS — F4312 Post-traumatic stress disorder, chronic: Secondary | ICD-10-CM

## 2017-12-07 DIAGNOSIS — F331 Major depressive disorder, recurrent, moderate: Secondary | ICD-10-CM

## 2017-12-07 MED FILL — LOSARTAN-HCTZ 100-25 MG TAB: 100-25 | 30 days supply | Qty: 30 | Fill #1

## 2017-12-07 MED FILL — ?OMEPRazole 20mg CPDR: 20 | 30 days supply | Qty: 30 | Fill #1

## 2017-12-07 MED FILL — ?ATORVASTATIN 40MG TABLET: 40 | 30 days supply | Qty: 30 | Fill #6

## 2017-12-07 MED FILL — traZODone HCL 100 MG TABS: 100 | 30 days supply | Qty: 60 | Fill #4

## 2017-12-08 MED FILL — !VENTOLIN HFA INHALER: 108 (90 BAS | 25 days supply | Qty: 18 | Fill #0

## 2017-12-12 DIAGNOSIS — C50911 Malignant neoplasm of unspecified site of right female breast: Secondary | ICD-10-CM

## 2017-12-12 HISTORY — DX: Malignant neoplasm of unspecified site of right female breast: C50.911

## 2017-12-20 ENCOUNTER — Other Ambulatory Visit (HOSPITAL_COMMUNITY): Payer: Self-pay | Admitting: Psychiatry

## 2017-12-20 DIAGNOSIS — F3181 Bipolar II disorder: Secondary | ICD-10-CM

## 2017-12-20 DIAGNOSIS — F331 Major depressive disorder, recurrent, moderate: Secondary | ICD-10-CM

## 2017-12-20 DIAGNOSIS — F4312 Post-traumatic stress disorder, chronic: Secondary | ICD-10-CM

## 2017-12-20 MED FILL — TRUE METRIX TEST STRIP: 30 days supply | Qty: 100 | Fill #2

## 2017-12-23 ENCOUNTER — Other Ambulatory Visit: Payer: Self-pay | Admitting: Surgery

## 2017-12-23 DIAGNOSIS — N6021 Fibroadenosis of right breast: Secondary | ICD-10-CM

## 2017-12-27 ENCOUNTER — Other Ambulatory Visit: Payer: Self-pay

## 2017-12-27 ENCOUNTER — Telehealth: Payer: Self-pay | Admitting: Pharmacy Technician

## 2017-12-27 DIAGNOSIS — M792 Neuralgia and neuritis, unspecified: Secondary | ICD-10-CM

## 2017-12-27 MED ORDER — PREGABALIN 100 MG PO CAPS: 100.0000 mg | ORAL_CAPSULE | Freq: Three times a day (TID) | ORAL | 1 refills | Status: DC

## 2017-12-27 NOTE — Telephone Encounter (Signed)
Roger,  This patient receives Lyrica 50 at her home through patient assistance at no cost. At this time I am processing a refill request through the program. Ms. Eisinger contacted me yesterday about her Lyrica refill and I told her it should be shipping in the next few days. She is concerned about having to go a few days without Lyrica while she waits for her shipment. She told me she has appointment with you tomorrow (12/27/2017) and I suggested she ask you for a temporary prescription for Gabapentin for her to take until her Lyrica arrives. I let her know that it is up to you to make that decision and she expressed understanding.  Please let me know if you have questions.   Baldo Ash Patient Advocate [phone]

## 2017-12-27 NOTE — Telephone Encounter (Signed)
Hello [name], thanks for your help with our patients. I am sure they appreciate you. I will be seeing Courtney Dixon tomorrow 12/28/17 and will discuss meds with her. Have a great day.

## 2017-12-28 ENCOUNTER — Encounter (INDEPENDENT_AMBULATORY_CARE_PROVIDER_SITE_OTHER): Payer: Self-pay | Admitting: Physician Assistant

## 2017-12-28 ENCOUNTER — Ambulatory Visit (INDEPENDENT_AMBULATORY_CARE_PROVIDER_SITE_OTHER): Payer: Medicaid Other | Admitting: Physician Assistant

## 2017-12-28 ENCOUNTER — Other Ambulatory Visit: Payer: Self-pay

## 2017-12-28 VITALS — BP 132/88 | HR 83 | Temp 98.0°F | Ht 64.0 in | Wt 228.8 lb

## 2017-12-28 DIAGNOSIS — F418 Other specified anxiety disorders: Secondary | ICD-10-CM | POA: Diagnosis not present

## 2017-12-28 DIAGNOSIS — M5442 Lumbago with sciatica, left side: Secondary | ICD-10-CM

## 2017-12-28 DIAGNOSIS — G4700 Insomnia, unspecified: Secondary | ICD-10-CM | POA: Diagnosis not present

## 2017-12-28 DIAGNOSIS — M5441 Lumbago with sciatica, right side: Secondary | ICD-10-CM

## 2017-12-28 DIAGNOSIS — G8929 Other chronic pain: Secondary | ICD-10-CM

## 2017-12-28 DIAGNOSIS — M5136 Other intervertebral disc degeneration, lumbar region: Secondary | ICD-10-CM | POA: Diagnosis not present

## 2017-12-28 MED ORDER — TRAZODONE HCL 50 MG PO TABS: 25.0000 mg | ORAL_TABLET | Freq: Every evening | ORAL | 5 refills | Status: DC | PRN

## 2017-12-28 MED ORDER — PREGABALIN 100 MG PO CAPS: 100.0000 mg | ORAL_CAPSULE | Freq: Three times a day (TID) | ORAL | 1 refills | Status: AC

## 2017-12-28 MED ORDER — HYDROXYZINE HCL 25 MG PO TABS: 25.0000 mg | ORAL_TABLET | Freq: Three times a day (TID) | ORAL | 0 refills | Status: DC | PRN

## 2017-12-28 MED ORDER — ESCITALOPRAM OXALATE 10 MG PO TABS: 10.0000 mg | ORAL_TABLET | Freq: Every day | ORAL | 2 refills | Status: AC

## 2017-12-28 MED ORDER — HYDROXYZINE HCL 25 MG PO TABS: 25.0000 mg | ORAL_TABLET | Freq: Two times a day (BID) | ORAL | 1 refills | Status: DC

## 2017-12-28 MED FILL — ESCITALOPRAM 10 MG TABLET: 10 | 30 days supply | Qty: 30 | Fill #0

## 2017-12-28 MED FILL — hydrOXYzine HCL 25 MG TABS: 25 | 30 days supply | Qty: 60 | Fill #0

## 2017-12-28 NOTE — Patient Instructions (Signed)

## 2017-12-28 NOTE — Progress Notes (Signed)
Subjective:  Patient ID: Courtney Dixon, female    DOB: [birth year]  Age: 52 y.o. MRN: [id number]  CC: anxiety  HPI Raffaela Ladley is a 52 y.o. female with a medical history of anxiety, depression, bipolar disorder?, PTSD, DM2, HTN, migraines, GERD, DDD, , tobacco use disorder, LBP with neuropathy presents for f/u of depression with anxiety. She is reducing trazadone as directed. Using Hydroxyzine 10 mg with mild to moderate effect but has found that she still needs at least some trazadone to help her sleep. PHQ9 12 and GAD7 14 today. Endorses continued LBP with sciatica. XR of lumbar spine ordered at last visit revealed minimal degenerative disc disease with disc height loss at L2-3 and L3-4. Needs a refill of her Lyrica. Does not have an orthopedic specialist managing LBP treatment yet. Would like a referral. Does not endorse any other symptoms or complaints.      Outpatient Medications Prior to Visit  Medication Sig Dispense Refill  . atorvastatin (LIPITOR) 40 MG tablet Take 1 tablet (40 mg total) by mouth daily. 40 tablet 5  . canagliflozin (INVOKANA) 100 MG TABS tablet Take 1 tablet (100 mg total) by mouth daily before breakfast. 30 tablet 5  . glimepiride (AMARYL) 4 MG tablet Take 2 tablets (8 mg total) by mouth daily with breakfast. 60 tablet 5  . glucose blood (TRUE METRIX BLOOD GLUCOSE TEST) test strip 1 each by Other route 3 (three) times daily. 100 each 11  . hydrOXYzine (ATARAX/VISTARIL) 10 MG tablet Take 1 tablet (10 mg total) by mouth 2 (two) times daily. 60 tablet 0  . ibuprofen (ADVIL,MOTRIN) 200 MG tablet Take 600 mg by mouth 2 (two) times daily.    . Insulin Pen Needle (B-D ULTRAFINE III SHORT PEN) 31G X 8 MM MISC 1 application by Does not apply route daily. 90 each 3  . lamoTRIgine (LAMICTAL) 200 MG tablet Take 1 tablet (200 mg total) daily by mouth. 90 tablet 1  . losartan-hydrochlorothiazide (HYZAAR) 100-25 MG tablet Take 1 tablet by mouth daily. 30 tablet 5  .  metFORMIN (GLUCOPHAGE-XR) 500 MG 24 hr tablet Take 2 tablets (1,000 mg total) by mouth 2 (two) times daily. 120 tablet 5  . omeprazole (PRILOSEC) 20 MG capsule Take 1 capsule (20 mg total) by mouth daily. 30 capsule 3  . pregabalin (LYRICA) 100 MG capsule Take 1 capsule (100 mg total) by mouth 3 (three) times daily. For PASS dose change 270 capsule 1  . TRUEPLUS LANCETS 28G MISC 1 each by Does not apply route 3 (three) times daily. 100 each 11  . VENTOLIN HFA 108 (90 Base) MCG/ACT inhaler INHALE 2 PUFFS INTO THE LUNGS EVERY 6 (SIX) HOURS AS NEEDED FOR WHEEZING OR SHORTNESS OF BREATH. 18 g 0  . Blood Glucose Monitoring Suppl (TRUE METRIX METER) w/Device KIT 1 each by Does not apply route as needed. (Patient not taking: Reported on 12/28/2017) 1 kit 0  . guanFACINE (TENEX) 1 MG tablet Take 1 tablet (1 mg total) 2 (two) times daily by mouth. (Patient not taking: Reported on 12/28/2017) 180 tablet 1  . traZODone (DESYREL) 100 MG tablet Take 1 tablet (100 mg total) by mouth at bedtime. (Patient not taking: Reported on 12/28/2017) 30 tablet 0   No facility-administered medications prior to visit.      ROS Review of Systems  Constitutional: Negative for chills, fever and malaise/fatigue.  Eyes: Negative for blurred vision.  Respiratory: Negative for shortness of breath.   Cardiovascular: Negative for chest  pain and palpitations.  Gastrointestinal: Negative for abdominal pain and nausea.  Genitourinary: Negative for dysuria and hematuria.  Musculoskeletal: Positive for back pain. Negative for joint pain and myalgias.  Skin: Negative for rash.  Neurological: Positive for tingling. Negative for headaches.  Psychiatric/Behavioral: Positive for depression. The patient is nervous/anxious and has insomnia.     Objective:  BP 132/88 (BP Location: Right Arm, Patient Position: Sitting, Cuff Size: Normal)   Pulse 83   Temp 98 F (36.7 C) (Oral)   Ht 5' 4"  (1.626 m)   Wt 228 lb 12.8 oz (103.8 kg)   SpO2  90%   BMI 39.27 kg/m   BP/Weight 12/28/2017 11/30/2017 12/31/9468  Systolic BP 962 836 629  Diastolic BP 88 86 96  Wt. (Lbs) 228.8 229.8 -  BMI 39.27 39.45 39.65  Some encounter information is confidential and restricted. Go to Review Flowsheets activity to see all data.      Physical Exam  Constitutional: She is oriented to person, place, and time.  Well developed, obese, NAD, polite  HENT:  Head: Normocephalic and atraumatic.  Eyes: No scleral icterus.  Neck: Normal range of motion. Neck supple. No thyromegaly present.  Cardiovascular: Normal rate, regular rhythm and normal heart sounds.  Pulmonary/Chest: Effort normal and breath sounds normal.  Musculoskeletal: She exhibits no edema.  Neurological: She is alert and oriented to person, place, and time.  Skin: Skin is warm and dry. No rash noted. No erythema. No pallor.  Psychiatric: Her behavior is normal. Thought content normal.  Anxious, talkative, thoughts linear, no pressured speech  Vitals reviewed.    Assessment & Plan:   1. Depression with anxiety - Begin escitalopram (LEXAPRO) 10 MG tablet; Take 1 tablet (10 mg total) by mouth daily.  Dispense: 30 tablet; Refill: 2 - Trazadone is being down titrated as this medication has proven to be ineffective overall for its intended purpose of helping her sleep. Pt also doing poorly in regards to depression control. I have advised to finish Trazadone down titrating over the next week or so.  - Continue on Lamictal - Pt advised to set up appointment with psychiatrist. Says she has transportation issues which has led her to being dismissed from previous psychiatrist. Nonetheless, I have advised for her to proceed with her plan to see a psychiatrist.  2. Insomnia, unspecified type - Decrease traZODone (DESYREL) 50 MG tablet; Take 0.5 tablets (25 mg total) by mouth at bedtime as needed for sleep.  Dispense: 30 tablet; Refill: 5 - Increase hydrOXYzine (ATARAX/VISTARIL) 25 MG tablet;  Take 1 tablet (25 mg total) by mouth 2 (two) times daily.  Dispense: 60 tablet; Refill: 1  3. Degenerative disc disease, lumbar - AMB referral to orthopedics  4. Chronic bilateral low back pain with bilateral sciatica - pregabalin (LYRICA) 100 MG capsule; Take 1 capsule (100 mg total) by mouth 3 (three) times daily. For PASS dose change  Dispense: 270 capsule; Refill: 1   Meds ordered this encounter  Medications  . DISCONTD: hydrOXYzine (ATARAX/VISTARIL) 25 MG tablet    Sig: Take 1 tablet (25 mg total) by mouth 3 (three) times daily as needed.    Dispense:  30 tablet    Refill:  0    Order Specific Question:   Supervising Provider    Answer:   Charlott Rakes [[id number]]  . pregabalin (LYRICA) 100 MG capsule    Sig: Take 1 capsule (100 mg total) by mouth 3 (three) times daily. For PASS dose change  Dispense:  270 capsule    Refill:  1    Order Specific Question:   Supervising Provider    Answer:   Charlott Rakes [[id number]]  . traZODone (DESYREL) 50 MG tablet    Sig: Take 0.5 tablets (25 mg total) by mouth at bedtime as needed for sleep.    Dispense:  30 tablet    Refill:  5    Order Specific Question:   Supervising Provider    Answer:   Charlott Rakes [[id number]]  . escitalopram (LEXAPRO) 10 MG tablet    Sig: Take 1 tablet (10 mg total) by mouth daily.    Dispense:  30 tablet    Refill:  2    Order Specific Question:   Supervising Provider    Answer:   Charlott Rakes [[id number]]  . hydrOXYzine (ATARAX/VISTARIL) 25 MG tablet    Sig: Take 1 tablet (25 mg total) by mouth 2 (two) times daily.    Dispense:  60 tablet    Refill:  1    Order Specific Question:   Supervising Provider    Answer:   Charlott Rakes [[id number]]    Follow-up: Return in about 6 weeks (around 02/08/2018) for A1c .   Clent Demark PA

## 2017-12-30 ENCOUNTER — Ambulatory Visit (INDEPENDENT_AMBULATORY_CARE_PROVIDER_SITE_OTHER): Payer: No Typology Code available for payment source | Admitting: Physician Assistant

## 2018-01-03 ENCOUNTER — Encounter (HOSPITAL_BASED_OUTPATIENT_CLINIC_OR_DEPARTMENT_OTHER): Payer: Self-pay | Admitting: *Deleted

## 2018-01-03 ENCOUNTER — Other Ambulatory Visit: Payer: Self-pay

## 2018-01-04 ENCOUNTER — Other Ambulatory Visit: Payer: Self-pay | Admitting: Surgery

## 2018-01-04 DIAGNOSIS — N6021 Fibroadenosis of right breast: Secondary | ICD-10-CM

## 2018-01-05 ENCOUNTER — Other Ambulatory Visit: Payer: Self-pay | Admitting: Surgery

## 2018-01-05 DIAGNOSIS — N6021 Fibroadenosis of right breast: Secondary | ICD-10-CM

## 2018-01-06 ENCOUNTER — Encounter (HOSPITAL_BASED_OUTPATIENT_CLINIC_OR_DEPARTMENT_OTHER)
Admission: RE | Admit: 2018-01-06 | Discharge: 2018-01-06 | Disposition: A | Discharge status: Home / Self Care | Payer: No Typology Code available for payment source | Source: Ambulatory Visit | Attending: Surgery | Admitting: Surgery

## 2018-01-06 ENCOUNTER — Other Ambulatory Visit: Payer: Self-pay

## 2018-01-06 DIAGNOSIS — Z01818 Encounter for other preprocedural examination: Secondary | ICD-10-CM | POA: Insufficient documentation

## 2018-01-06 LAB — BASIC METABOLIC PANEL
Anion gap: 14 (ref 5–15)
BUN: 9 mg/dL (ref 6–20)
CHLORIDE: 98 mmol/L (ref 98–111)
CO2: 30 mmol/L (ref 22–32)
Calcium: 10 mg/dL (ref 8.9–10.3)
Creatinine, Ser: 0.76 mg/dL (ref 0.44–1.00)
GFR calc Af Amer: >60 mL/min (ref >60)
GFR calc non Af Amer: >60 mL/min (ref >60)
GLUCOSE: 85 mg/dL (ref 70–99)
POTASSIUM: 4.1 mmol/L (ref 3.5–5.1)
Sodium: 142 mmol/L (ref 135–145)

## 2018-01-06 MED FILL — LOSARTAN-HCTZ 100-25 MG TAB: 100-25 | 30 days supply | Qty: 30 | Fill #2

## 2018-01-06 MED FILL — ?OMEPRazole 20mg CPDR: 20 | 30 days supply | Qty: 30 | Fill #2

## 2018-01-06 MED FILL — ?GLIMEPIRIDE 4 MG TABLET: 4 | 30 days supply | Qty: 60 | Fill #3

## 2018-01-06 MED FILL — ?ATORVASTATIN 40MG TABLET: 40 | 30 days supply | Qty: 30 | Fill #7

## 2018-01-06 MED FILL — lamoTRIgine 200 MG TABS: 200 | 30 days supply | Qty: 30 | Fill #4

## 2018-01-06 MED FILL — INVOKANA 100 MG TABLET: 100 | 30 days supply | Qty: 30 | Fill #4

## 2018-01-06 MED FILL — traZODone HCL 100 MG TABS: 100 | 30 days supply | Qty: 60 | Fill #5

## 2018-01-06 NOTE — Pre-Procedure Instructions (Signed)
Ensure pre-surgery drink 10 oz. given to pt. with instructions to drink by 0530 DOS.  CHG 4% surgical scrub 4 oz. given to pt. with instructions to use in shower night before surgery and AM DOS - to use from neck down, not to use on genital area.  Pt. voiced understanding.

## 2018-01-09 ENCOUNTER — Ambulatory Visit
Admission: RE | Admit: 2018-01-09 | Discharge: 2018-01-09 | Disposition: A | Discharge status: Home / Self Care | Payer: No Typology Code available for payment source | Source: Ambulatory Visit | Attending: Surgery | Admitting: Surgery

## 2018-01-09 DIAGNOSIS — N6021 Fibroadenosis of right breast: Secondary | ICD-10-CM

## 2018-01-10 NOTE — H&P (Signed)
Courtney Dixon Documented: 12/23/2017 11:25 AM Location: [facility] Patient #: [id number] DOB: 1965-11-28 Divorced / Language: Courtney Dixon / Race: White Female   History of Present Illness (Courtney Dixon A. Ninfa Linden MD; 12/23/2017 12:10 PM) The patient is a 52 year old female who presents with a complaint of Breast problems. This patient is referred by [name] after recent diagnosis of a complex sclerosing lesion of the right breast. This was found on screening mammography. She also had a benign fibroadenoma biopsied on the right breast as well. She has no previous history of problems with her breasts. She denies nipple discharge. She has a family history of breast cancer mother and her maternal aunt. She is currently otherwise without complaints.   Past Surgical History Courtney Dixon; 12/23/2017 11:25 AM) Breast Biopsy  Right. multiple Cesarean Section - Multiple  Oral Surgery   Diagnostic Studies History Courtney Dixon; 12/23/2017 11:25 AM) Mammogram  within last year  Allergies Courtney Dixon; 12/23/2017 11:26 AM) Sulfa Drugs  Allergies Reconciled   Medication History Courtney Dixon; 12/23/2017 11:27 AM) Ventolin HFA (108 (90 Base)MCG/ACT Aerosol Soln, Inhalation) Active. Atorvastatin Calcium (40MG  Tablet, Oral) Active. Glimepiride (4MG  Tablet, Oral) Active. TraZODone HCl (100MG  Tablet, Oral) Active. MetFORMIN HCl ER (500MG  Tablet ER 24HR, Oral) Active. GuanFACINE HCl (1MG  Tablet, Oral) Active. HydrOXYzine HCl (10MG  Tablet, Oral) Active. Invokana (100MG  Tablet, Oral) Active. LamoTRIgine (200MG  Tablet, Oral) Active. Losartan Potassium-HCTZ (100-25MG  Tablet, Oral) Active. Omeprazole (20MG  Capsule DR, Oral) Active. Medications Reconciled  Social History Courtney Dixon; 12/23/2017 11:25 AM) Alcohol use  Occasional alcohol use. Caffeine use  Coffee. Illicit drug use  Remotely quit drug use. Tobacco use  Current every day smoker.  Family  History Courtney Dixon; 12/23/2017 11:25 AM) Breast Cancer  Mother. Depression  Brother, Sister. Diabetes Mellitus  Brother, Sister. Heart Disease  Brother, Father, Mother, Sister. Heart disease in female family member before age 10  Heart disease in female family member before age 32  Hypertension  Brother, Father, Mother, Sister.  Pregnancy / Birth History Courtney Dixon; 12/23/2017 11:25 AM) Age at menarche  55 years. Age of menopause  <45 Contraceptive History  Oral contraceptives. Gravida  3 Irregular periods  Length (months) of breastfeeding  3-6 Maternal age  51-25 Para  3  Other Problems Courtney Dixon; 12/23/2017 11:25 AM) Anxiety Disorder  Back Pain  Cholelithiasis  Depression  Diabetes Mellitus  High blood pressure  Hypercholesterolemia  Migraine Headache     Review of Systems Courtney Dixon; 12/23/2017 11:26 AM) General Not Present- Appetite Loss, Chills, Fatigue, Fever, Night Sweats, Weight Gain and Weight Loss. Skin Not Present- Change in Wart/Mole, Dryness, Hives, Jaundice, New Lesions, Non-Healing Wounds, Rash and Ulcer. HEENT Present- Wears glasses/contact lenses. Not Present- Earache, Hearing Loss, Hoarseness, Nose Bleed, Oral Ulcers, Ringing in the Ears, Seasonal Allergies, Sinus Pain, Sore Throat, Visual Disturbances and Yellow Eyes. Respiratory Not Present- Bloody sputum, Chronic Cough, Difficulty Breathing, Snoring and Wheezing. Breast Not Present- Breast Mass, Breast Pain, Nipple Discharge and Skin Changes. Cardiovascular Not Present- Chest Pain, Difficulty Breathing Lying Down, Leg Cramps, Palpitations, Rapid Heart Rate, Shortness of Breath and Swelling of Extremities. Gastrointestinal Present- Indigestion. Not Present- Abdominal Pain, Bloating, Bloody Stool, Change in Bowel Habits, Chronic diarrhea, Constipation, Difficulty Swallowing, Excessive gas, Gets full quickly at meals, Hemorrhoids, Nausea, Rectal Pain and Vomiting. Female  Genitourinary Not Present- Frequency, Nocturia, Painful Urination, Pelvic Pain and Urgency. Musculoskeletal Present- Back Pain. Not Present- Joint Pain, Joint Stiffness, Muscle Pain, Muscle Weakness and Swelling of Extremities. Neurological  Present- Decreased Memory. Not Present- Fainting, Headaches, Numbness, Seizures, Tingling, Tremor, Trouble walking and Weakness. Psychiatric Present- Anxiety, Depression, Fearful and Frequent crying. Not Present- Bipolar and Change in Sleep Pattern. Endocrine Not Present- Cold Intolerance, Excessive Hunger, Hair Changes, Heat Intolerance, Hot flashes and New Diabetes. Hematology Not Present- Blood Thinners, Easy Bruising, Excessive bleeding, Gland problems, HIV and Persistent Infections.  Vitals Courtney Dixon; 12/23/2017 11:28 AM) 12/23/2017 11:27 AM Weight: 226.38 lb Height: 64in Body Surface Area: 2.06 m Body Mass Index: 38.86 kg/m  Temp.: 98.2F(Oral)  Pulse: 94 (Regular)  BP: 136/84 (Sitting, Left Arm, Standard)       Physical Exam (Taygen Acklin A. Ninfa Linden MD; 12/23/2017 12:14 PM) General Mental Status-Alert. General Appearance-Consistent with stated age. Hydration-Well hydrated. Voice-Normal.  Head and Neck Head-normocephalic, atraumatic with no lesions or palpable masses. Trachea-midline. Thyroid Gland Characteristics - normal size and consistency.  Eye Eyeball - Bilateral-Extraocular movements intact. Sclera/Conjunctiva - Bilateral-No scleral icterus.  Chest and Lung Exam Chest and lung exam reveals -quiet, even and easy respiratory effort with no use of accessory muscles and on auscultation, normal breath sounds, no adventitious sounds and normal vocal resonance. Inspection Chest Wall - Normal. Back - normal.  Breast Breast - Left-Symmetric, Non Tender, No Biopsy scars, no Dimpling, No Inflammation, No Lumpectomy scars, No Mastectomy scars, No Peau d' Orange. Breast - Right-Symmetric, Non Tender, No  Biopsy scars, no Dimpling, No Inflammation, No Lumpectomy scars, No Mastectomy scars, No Peau d' Orange. Breast Lump-No Palpable Breast Mass.  Cardiovascular Cardiovascular examination reveals -normal heart sounds, regular rate and rhythm with no murmurs and normal pedal pulses bilaterally.  Abdomen Inspection Inspection of the abdomen reveals - No Hernias. Skin - Scar - no surgical scars. Palpation/Percussion Palpation and Percussion of the abdomen reveal - Soft, Non Tender, No Rebound tenderness, No Rigidity (guarding) and No hepatosplenomegaly. Auscultation Auscultation of the abdomen reveals - Bowel sounds normal.  Neurologic - Did not examine.  Musculoskeletal - Did not examine.  Lymphatic Head & Neck  General Head & Neck Lymphatics: Bilateral - Description - Normal. Axillary  General Axillary Region: Bilateral - Description - Normal. Tenderness - Non Tender. Femoral & Inguinal - Did not examine.    Assessment & Plan (Jawana Reagor A. 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 I discussed the diagnosis with the patient in detail. Given the pathologic findings as well as her family history, removal of this area is recommended. I discussed a right breast radioactive seed guided lumpectomy with her in detail. We discussed the surgical procedure. We discussed the risks of surgery which includes but is not limited to bleeding, infection, injury to surrounding structures, the need for further procedures of malignancy is found, cardiopulmonary issues, postoperative recovery, DVT, etc. She understands and wishes to proceed with surgery which will be scheduled

## 2018-01-11 ENCOUNTER — Ambulatory Visit (HOSPITAL_BASED_OUTPATIENT_CLINIC_OR_DEPARTMENT_OTHER)
Admission: RE | Admit: 2018-01-11 | Discharge: 2018-01-11 | Disposition: A | Discharge status: Home / Self Care | Payer: Self-pay | Source: Ambulatory Visit | Attending: Surgery | Admitting: Surgery

## 2018-01-11 ENCOUNTER — Ambulatory Visit
Admission: RE | Admit: 2018-01-11 | Discharge: 2018-01-11 | Disposition: A | Discharge status: Home / Self Care | Payer: No Typology Code available for payment source | Source: Ambulatory Visit | Attending: Surgery | Admitting: Surgery

## 2018-01-11 ENCOUNTER — Encounter (HOSPITAL_BASED_OUTPATIENT_CLINIC_OR_DEPARTMENT_OTHER)
Admission: RE | Disposition: A | Discharge status: Home / Self Care | Payer: Self-pay | Source: Ambulatory Visit | Attending: Surgery

## 2018-01-11 ENCOUNTER — Other Ambulatory Visit: Payer: Self-pay

## 2018-01-11 ENCOUNTER — Ambulatory Visit (HOSPITAL_BASED_OUTPATIENT_CLINIC_OR_DEPARTMENT_OTHER): Payer: Self-pay | Admitting: Anesthesiology

## 2018-01-11 ENCOUNTER — Encounter (HOSPITAL_BASED_OUTPATIENT_CLINIC_OR_DEPARTMENT_OTHER): Payer: Self-pay | Admitting: *Deleted

## 2018-01-11 DIAGNOSIS — F319 Bipolar disorder, unspecified: Secondary | ICD-10-CM | POA: Insufficient documentation

## 2018-01-11 DIAGNOSIS — K8020 Calculus of gallbladder without cholecystitis without obstruction: Secondary | ICD-10-CM | POA: Insufficient documentation

## 2018-01-11 DIAGNOSIS — Z882 Allergy status to sulfonamides status: Secondary | ICD-10-CM | POA: Insufficient documentation

## 2018-01-11 DIAGNOSIS — Z833 Family history of diabetes mellitus: Secondary | ICD-10-CM | POA: Insufficient documentation

## 2018-01-11 DIAGNOSIS — N6021 Fibroadenosis of right breast: Secondary | ICD-10-CM

## 2018-01-11 DIAGNOSIS — Z79899 Other long term (current) drug therapy: Secondary | ICD-10-CM | POA: Insufficient documentation

## 2018-01-11 DIAGNOSIS — Z8249 Family history of ischemic heart disease and other diseases of the circulatory system: Secondary | ICD-10-CM | POA: Insufficient documentation

## 2018-01-11 DIAGNOSIS — Z7984 Long term (current) use of oral hypoglycemic drugs: Secondary | ICD-10-CM | POA: Insufficient documentation

## 2018-01-11 DIAGNOSIS — Z7951 Long term (current) use of inhaled steroids: Secondary | ICD-10-CM | POA: Insufficient documentation

## 2018-01-11 DIAGNOSIS — I10 Essential (primary) hypertension: Secondary | ICD-10-CM | POA: Insufficient documentation

## 2018-01-11 DIAGNOSIS — E119 Type 2 diabetes mellitus without complications: Secondary | ICD-10-CM | POA: Insufficient documentation

## 2018-01-11 DIAGNOSIS — F419 Anxiety disorder, unspecified: Secondary | ICD-10-CM | POA: Insufficient documentation

## 2018-01-11 DIAGNOSIS — F17200 Nicotine dependence, unspecified, uncomplicated: Secondary | ICD-10-CM | POA: Insufficient documentation

## 2018-01-11 DIAGNOSIS — G43909 Migraine, unspecified, not intractable, without status migrainosus: Secondary | ICD-10-CM | POA: Insufficient documentation

## 2018-01-11 DIAGNOSIS — Z818 Family history of other mental and behavioral disorders: Secondary | ICD-10-CM | POA: Insufficient documentation

## 2018-01-11 DIAGNOSIS — K219 Gastro-esophageal reflux disease without esophagitis: Secondary | ICD-10-CM | POA: Insufficient documentation

## 2018-01-11 DIAGNOSIS — E7800 Pure hypercholesterolemia, unspecified: Secondary | ICD-10-CM | POA: Insufficient documentation

## 2018-01-11 DIAGNOSIS — D0511 Intraductal carcinoma in situ of right breast: Secondary | ICD-10-CM | POA: Insufficient documentation

## 2018-01-11 DIAGNOSIS — Z803 Family history of malignant neoplasm of breast: Secondary | ICD-10-CM | POA: Insufficient documentation

## 2018-01-11 HISTORY — PX: BREAST LUMPECTOMY WITH RADIOACTIVE SEED LOCALIZATION: SHX6424

## 2018-01-11 HISTORY — DX: Dyspnea, unspecified: R06.00

## 2018-01-11 LAB — GLUCOSE, CAPILLARY
GLUCOSE-CAPILLARY: 102 mg/dL — AB (ref 70–99)
Glucose-Capillary: 227 mg/dL — ABNORMAL HIGH (ref 70–99)

## 2018-01-11 SURGERY — BREAST LUMPECTOMY WITH RADIOACTIVE SEED LOCALIZATION
Anesthesia: General | Site: Breast | Laterality: Right

## 2018-01-11 MED ORDER — LIDOCAINE HCL (CARDIAC) PF 100 MG/5ML IV SOSY
PREFILLED_SYRINGE | INTRAVENOUS | Status: DC | PRN
  Administered 2018-01-11: 100 mg via INTRAVENOUS

## 2018-01-11 MED ORDER — LIDOCAINE HCL (CARDIAC) PF 100 MG/5ML IV SOSY
PREFILLED_SYRINGE | INTRAVENOUS | Status: AC
  Filled 2018-01-11: qty 5

## 2018-01-11 MED ORDER — CEFAZOLIN SODIUM-DEXTROSE 2-4 GM/100ML-% IV SOLN
INTRAVENOUS | Status: AC
  Filled 2018-01-11: qty 100

## 2018-01-11 MED ORDER — PROMETHAZINE HCL 25 MG/ML IJ SOLN: 6.2500 mg | INTRAMUSCULAR | Status: DC | PRN

## 2018-01-11 MED ORDER — SCOPOLAMINE 1 MG/3DAYS TD PT72: 1.0000 | MEDICATED_PATCH | Freq: Once | TRANSDERMAL | Status: DC | PRN

## 2018-01-11 MED ORDER — LACTATED RINGERS IV SOLN
INTRAVENOUS | Status: DC
  Administered 2018-01-11: 08:00:00 via INTRAVENOUS

## 2018-01-11 MED ORDER — GABAPENTIN 300 MG PO CAPS
ORAL_CAPSULE | ORAL | Status: AC
  Filled 2018-01-11: qty 1

## 2018-01-11 MED ORDER — CHLORHEXIDINE GLUCONATE CLOTH 2 % EX PADS: 6.0000 | MEDICATED_PAD | Freq: Once | CUTANEOUS | Status: DC

## 2018-01-11 MED ORDER — CEFAZOLIN SODIUM-DEXTROSE 2-3 GM-%(50ML) IV SOLR
INTRAVENOUS | Status: DC | PRN
  Administered 2018-01-11: 2 g via INTRAVENOUS

## 2018-01-11 MED ORDER — BUPIVACAINE-EPINEPHRINE (PF) 0.5% -1:200000 IJ SOLN
INTRAMUSCULAR | Status: AC
  Filled 2018-01-11: qty 30

## 2018-01-11 MED ORDER — ONDANSETRON HCL 4 MG/2ML IJ SOLN
INTRAMUSCULAR | Status: AC
  Filled 2018-01-11: qty 2

## 2018-01-11 MED ORDER — PROPOFOL 10 MG/ML IV BOLUS
INTRAVENOUS | Status: AC
  Filled 2018-01-11: qty 40

## 2018-01-11 MED ORDER — ACETAMINOPHEN 500 MG PO TABS
ORAL_TABLET | ORAL | Status: AC
  Filled 2018-01-11: qty 2

## 2018-01-11 MED ORDER — MIDAZOLAM HCL 2 MG/2ML IJ SOLN
1.0000 mg | INTRAMUSCULAR | Status: DC | PRN
  Administered 2018-01-11: 2 mg via INTRAVENOUS

## 2018-01-11 MED ORDER — ACETAMINOPHEN 500 MG PO TABS
1000.0000 mg | ORAL_TABLET | ORAL | Status: AC
  Administered 2018-01-11: 1000 mg via ORAL

## 2018-01-11 MED ORDER — SODIUM BICARBONATE 4 % IV SOLN
INTRAVENOUS | Status: AC
  Filled 2018-01-11: qty 5

## 2018-01-11 MED ORDER — OXYCODONE HCL 5 MG PO TABS: 5.0000 mg | ORAL_TABLET | Freq: Four times a day (QID) | ORAL | 0 refills | Status: DC | PRN

## 2018-01-11 MED ORDER — FENTANYL CITRATE (PF) 100 MCG/2ML IJ SOLN
INTRAMUSCULAR | Status: AC
  Filled 2018-01-11: qty 2

## 2018-01-11 MED ORDER — GABAPENTIN 300 MG PO CAPS
300.0000 mg | ORAL_CAPSULE | ORAL | Status: AC
  Administered 2018-01-11: 300 mg via ORAL

## 2018-01-11 MED ORDER — PROPOFOL 10 MG/ML IV BOLUS
INTRAVENOUS | Status: DC | PRN
  Administered 2018-01-11: 200 mg via INTRAVENOUS

## 2018-01-11 MED ORDER — LIDOCAINE HCL (PF) 1 % IJ SOLN
INTRAMUSCULAR | Status: AC
  Filled 2018-01-11: qty 30

## 2018-01-11 MED ORDER — FENTANYL CITRATE (PF) 100 MCG/2ML IJ SOLN: 25.0000 ug | INTRAMUSCULAR | Status: DC | PRN

## 2018-01-11 MED ORDER — DEXAMETHASONE SODIUM PHOSPHATE 10 MG/ML IJ SOLN
INTRAMUSCULAR | Status: AC
  Filled 2018-01-11: qty 1

## 2018-01-11 MED ORDER — ONDANSETRON HCL 4 MG/2ML IJ SOLN
INTRAMUSCULAR | Status: DC | PRN
  Administered 2018-01-11: 4 mg via INTRAVENOUS

## 2018-01-11 MED ORDER — BUPIVACAINE-EPINEPHRINE 0.5% -1:200000 IJ SOLN
INTRAMUSCULAR | Status: DC | PRN
  Administered 2018-01-11: 20 mL

## 2018-01-11 MED ORDER — FENTANYL CITRATE (PF) 100 MCG/2ML IJ SOLN
50.0000 ug | INTRAMUSCULAR | Status: DC | PRN
  Administered 2018-01-11: 50 ug via INTRAVENOUS

## 2018-01-11 MED ORDER — MIDAZOLAM HCL 2 MG/2ML IJ SOLN
INTRAMUSCULAR | Status: AC
  Filled 2018-01-11: qty 2

## 2018-01-11 MED ORDER — CEFAZOLIN SODIUM-DEXTROSE 2-4 GM/100ML-% IV SOLN: 2.0000 g | INTRAVENOUS | Status: DC

## 2018-01-11 SURGICAL SUPPLY — 47 items
APPLIER CLIP 9.375 MED OPEN (MISCELLANEOUS)
BLADE HEX COATED 2.75 (ELECTRODE) ×3 IMPLANT
BLADE SURG 15 STRL LF DISP TIS (BLADE) ×1 IMPLANT
BLADE SURG 15 STRL SS (BLADE) ×2
CANISTER SUC SOCK COL 7IN (MISCELLANEOUS) IMPLANT
CANISTER SUCT 1200ML W/VALVE (MISCELLANEOUS) IMPLANT
CHLORAPREP W/TINT 26ML (MISCELLANEOUS) ×3 IMPLANT
CLIP APPLIE 9.375 MED OPEN (MISCELLANEOUS) IMPLANT
CLIP VESOCCLUDE SM WIDE 6/CT (CLIP) IMPLANT
COVER BACK TABLE 60X90IN (DRAPES) ×3 IMPLANT
COVER MAYO STAND STRL (DRAPES) ×3 IMPLANT
COVER PROBE W GEL 5X96 (DRAPES) ×3 IMPLANT
DECANTER SPIKE VIAL GLASS SM (MISCELLANEOUS) IMPLANT
DERMABOND ADVANCED (GAUZE/BANDAGES/DRESSINGS) ×2
DERMABOND ADVANCED .7 DNX12 (GAUZE/BANDAGES/DRESSINGS) ×1 IMPLANT
DEVICE DUBIN W/COMP PLATE 8390 (MISCELLANEOUS) ×3 IMPLANT
DRAPE LAPAROSCOPIC ABDOMINAL (DRAPES) ×3 IMPLANT
DRAPE UTILITY XL STRL (DRAPES) ×3 IMPLANT
ELECT REM PT RETURN 9FT ADLT (ELECTROSURGICAL) ×3
ELECTRODE REM PT RTRN 9FT ADLT (ELECTROSURGICAL) ×1 IMPLANT
GAUZE SPONGE 4X4 12PLY STRL LF (GAUZE/BANDAGES/DRESSINGS) IMPLANT
GLOVE BIO SURGEON STRL SZ 6.5 (GLOVE) ×2 IMPLANT
GLOVE BIO SURGEONS STRL SZ 6.5 (GLOVE) ×1
GLOVE BIOGEL PI IND STRL 7.0 (GLOVE) ×2 IMPLANT
GLOVE BIOGEL PI INDICATOR 7.0 (GLOVE) ×4
GLOVE SURG SIGNA 7.5 PF LTX (GLOVE) ×3 IMPLANT
GOWN STRL REUS W/ TWL LRG LVL3 (GOWN DISPOSABLE) ×1 IMPLANT
GOWN STRL REUS W/ TWL XL LVL3 (GOWN DISPOSABLE) ×1 IMPLANT
GOWN STRL REUS W/TWL LRG LVL3 (GOWN DISPOSABLE) ×2
GOWN STRL REUS W/TWL XL LVL3 (GOWN DISPOSABLE) ×2
KIT MARKER MARGIN INK (KITS) ×3 IMPLANT
NEEDLE HYPO 25X1 1.5 SAFETY (NEEDLE) ×3 IMPLANT
NS IRRIG 1000ML POUR BTL (IV SOLUTION) IMPLANT
PACK BASIN DAY SURGERY FS (CUSTOM PROCEDURE TRAY) ×3 IMPLANT
PENCIL BUTTON HOLSTER BLD 10FT (ELECTRODE) ×3 IMPLANT
SLEEVE SCD COMPRESS KNEE MED (MISCELLANEOUS) ×3 IMPLANT
SPONGE LAP 4X18 RFD (DISPOSABLE) ×3 IMPLANT
SUT MNCRL AB 4-0 PS2 18 (SUTURE) ×3 IMPLANT
SUT SILK 2 0 SH (SUTURE) IMPLANT
SUT VIC AB 3-0 SH 27 (SUTURE) ×2
SUT VIC AB 3-0 SH 27X BRD (SUTURE) ×1 IMPLANT
SYR CONTROL 10ML LL (SYRINGE) ×3 IMPLANT
TOWEL GREEN STERILE FF (TOWEL DISPOSABLE) ×3 IMPLANT
TOWEL OR NON WOVEN STRL DISP B (DISPOSABLE) IMPLANT
TUBE CONNECTING 20'X1/4 (TUBING)
TUBE CONNECTING 20X1/4 (TUBING) IMPLANT
YANKAUER SUCT BULB TIP NO VENT (SUCTIONS) IMPLANT

## 2018-01-11 NOTE — Anesthesia Procedure Notes (Signed)
Procedure Name: LMA Insertion Performed by: Verita Lamb, CRNA Pre-anesthesia Checklist: Patient identified, Emergency Drugs available, Suction available, Patient being monitored and Timeout performed Patient Re-evaluated:Patient Re-evaluated prior to induction Oxygen Delivery Method: Circle system utilized Preoxygenation: Pre-oxygenation with 100% oxygen Induction Type: IV induction Ventilation: Mask ventilation without difficulty LMA: LMA inserted LMA Size: 4.0 Number of attempts: 1 Placement Confirmation: positive ETCO2,  CO2 detector and breath sounds checked- equal and bilateral Tube secured with: Tape Dental Injury: Teeth and Oropharynx as per pre-operative assessment

## 2018-01-11 NOTE — Interval H&P Note (Signed)
History and Physical Interval Note: no change in H and P  01/11/2018 7:04 AM  Courtney Dixon  has presented today for surgery, with the diagnosis of RIGHT BREAST COMPLEX SCLEROSING LESION  The various methods of treatment have been discussed with the patient and family. After consideration of risks, benefits and other options for treatment, the patient has consented to  Procedure(s): BREAST LUMPECTOMY WITH RADIOACTIVE SEED LOCALIZATION (Right) as a surgical intervention .  The patient's history has been reviewed, patient examined, no change in status, stable for surgery.  I have reviewed the patient's chart and labs.  Questions were answered to the patient's satisfaction.     [name]

## 2018-01-11 NOTE — Anesthesia Preprocedure Evaluation (Addendum)
Anesthesia Evaluation  Patient identified by MRN, date of birth, ID band Patient awake    Reviewed: Allergy & Precautions, NPO status , Patient's Chart, lab work & pertinent test results  History of Anesthesia Complications Negative for: history of anesthetic complications  Airway Mallampati: II  TM Distance: >3 FB Neck ROM: Full    Dental  (+) Dental Advisory Given, Chipped, Poor Dentition, Missing   Pulmonary Current Smoker,    Pulmonary exam normal breath sounds clear to auscultation       Cardiovascular hypertension, Pt. on medications Normal cardiovascular exam Rhythm:Regular Rate:Normal     Neuro/Psych  Headaches, PSYCHIATRIC DISORDERS Anxiety Depression Bipolar Disorder  Neuromuscular disease    GI/Hepatic Neg liver ROS, GERD  Medicated and Controlled,  Endo/Other  diabetes, Type 2, Oral Hypoglycemic AgentsObesity   Renal/GU negative Renal ROS     Musculoskeletal negative musculoskeletal ROS (+)   Abdominal   Peds  Hematology negative hematology ROS (+)   Anesthesia Other Findings Day of surgery medications reviewed with the patient.  Reproductive/Obstetrics                            Anesthesia Physical Anesthesia Plan  ASA: III  Anesthesia Plan: General   Post-op Pain Management:    Induction: Intravenous  PONV Risk Score and Plan: 2 and Midazolam, Dexamethasone and Ondansetron  Airway Management Planned: LMA  Additional Equipment:   Intra-op Plan:   Post-operative Plan: Extubation in OR  Informed Consent: I have reviewed the patients History and Physical, chart, labs and discussed the procedure including the risks, benefits and alternatives for the proposed anesthesia with the patient or authorized representative who has indicated his/her understanding and acceptance.   Dental advisory given  Plan Discussed with: CRNA  Anesthesia Plan Comments:          Anesthesia Quick Evaluation

## 2018-01-11 NOTE — Op Note (Signed)
BREAST LUMPECTOMY WITH RADIOACTIVE SEED LOCALIZATION  Procedure Note  Aleeyah Bensen 01/11/2018   Pre-op Diagnosis: RIGHT BREAST COMPLEX SCLEROSING LESION     Post-op Diagnosis: same  Procedure(s): RIGHT BREAST LUMPECTOMY WITH RADIOACTIVE SEED LOCALIZATION  Surgeon(s): Coralie Keens, MD  Anesthesia: General  Staff:  Circulator: Lynelle Doctor, RN Scrub Person: Tresa Res, RN  Estimated Blood Loss: Minimal               Specimens: sent to path   Indications: This is a 52 year old female who was found to have an abnormality in the right breast on mammography.  A biopsy was performed showing a complex sclerosing lesion.  Excisional biopsy was recommended  Procedure: The patient was then applied to correct patient in the holding area.  I used the neoprobe to confirm that the radioactive seed was present in the right breast.  She was then taken to the operating room.  She was placed upon the operating table and general anesthesia was induced.  Her right breast was then prepped and draped in usual sterile fashion.  The seed was located in the lower inner quadrant of the right breast underneath the areola.  I anesthetized the skin with Marcaine and made a circumareolar incision with a scalpel.  I then dissected down into the breast tissue with the aid of the neoprobe to identify the radioactive seed.  I performed a lumpectomy with electrocautery.  Once the specimen was removed marked the margins with paint.  I confirmed that the seed was in the specimen with the neoprobe and x-ray confirmed that it was present along with the previous marker.  The specimen was then sent to pathology for evaluation.  I achieved hemostasis with the cautery.  I then closed the subcutaneous tissue with interrupted 3-0 Vicryl sutures and closed the skin with a running 4-0 Monocryl.  Dermabond was then applied.  The patient tolerated procedure well.  All the counts were correct at the end of the  procedure.  The patient was then extubated in the operating room and taken in a stable condition to the recovery room.        Deziya Amero A   Date: 01/11/2018  Time: 8:04 AM

## 2018-01-11 NOTE — Transfer of Care (Signed)
Immediate Anesthesia Transfer of Care Note  Patient: Courtney Dixon  Procedure(s) Performed: BREAST LUMPECTOMY WITH RADIOACTIVE SEED LOCALIZATION (Right Breast)  Patient Location: PACU  Anesthesia Type:General  Level of Consciousness: awake, alert  and oriented  Airway & Oxygen Therapy: Patient Spontanous Breathing and Patient connected to face mask oxygen  Post-op Assessment: Report given to RN and Post -op Vital signs reviewed and stable  Post vital signs: Reviewed and stable  Last Vitals:  Vitals Value Taken Time  BP 112/71 01/11/2018  8:10 AM  Temp    Pulse 76 01/11/2018  8:11 AM  Resp 12 01/11/2018  8:11 AM  SpO2 97 % 01/11/2018  8:11 AM  Vitals shown include unvalidated device data.  Last Pain:  Vitals:   01/11/18 0981  TempSrc: Oral  PainSc: 0-No pain      Patients Stated Pain Goal: 0 (19/14/78 2956)  Complications: No apparent anesthesia complications

## 2018-01-11 NOTE — Anesthesia Postprocedure Evaluation (Signed)
Anesthesia Post Note  Patient: Courtney Dixon  Procedure(s) Performed: BREAST LUMPECTOMY WITH RADIOACTIVE SEED LOCALIZATION (Right Breast)     Patient location during evaluation: PACU Anesthesia Type: General Level of consciousness: awake and alert Pain management: pain level controlled Vital Signs Assessment: post-procedure vital signs reviewed and stable Respiratory status: spontaneous breathing, nonlabored ventilation and respiratory function stable Cardiovascular status: blood pressure returned to baseline and stable Postop Assessment: no apparent nausea or vomiting Anesthetic complications: no    Last Vitals:  Vitals:   01/11/18 0838 01/11/18 0852  BP:  111/69  Pulse: 80 82  Resp: (!) 22 16  Temp:  37.1 C  SpO2: 93% 96%    Last Pain:  Vitals:   01/11/18 0852  TempSrc:   PainSc: 2                  Catalina Gravel

## 2018-01-11 NOTE — Discharge Instructions (Signed)
[facility] °Office Phone Number [phone] ° °BREAST BIOPSY/ LUMPECTOMY: POST OP INSTRUCTIONS ° °Always review your discharge instruction sheet given to you by the facility where your surgery was performed. ° °IF YOU HAVE DISABILITY OR FAMILY LEAVE FORMS, YOU MUST BRING THEM TO THE OFFICE FOR PROCESSING.  DO NOT GIVE THEM TO YOUR DOCTOR. ° °1. A prescription for pain medication may be given to you upon discharge.  Take your pain medication as prescribed, if needed.  If narcotic pain medicine is not needed, then you may take acetaminophen (Tylenol) or ibuprofen (Advil) as needed. °2. Take your usually prescribed medications unless otherwise directed °3. If you need a refill on your pain medication, please contact your pharmacy.  They will contact our office to request authorization.  Prescriptions will not be filled after 5pm or on week-ends. °4. You should eat very light the first 24 hours after surgery, such as soup, crackers, pudding, etc.  Resume your normal diet the day after surgery. °5. Most patients will experience some swelling and bruising in the breast.  Ice packs and a good support bra will help.  Swelling and bruising can take several days to resolve.  °6. It is common to experience some constipation if taking pain medication after surgery.  Increasing fluid intake and taking a stool softener will usually help or prevent this problem from occurring.  A mild laxative (Milk of Magnesia or Miralax) should be taken according to package directions if there are no bowel movements after 48 hours. °7. Unless discharge instructions indicate otherwise, you may remove your bandages 24-48 hours after surgery, and you may shower at that time.  You may have steri-strips (small skin tapes) in place directly over the incision.  These strips should be left on the skin for 7-10 days.  If your surgeon used skin glue on the incision, you may shower in 24 hours.  The glue will flake off over the next 2-3  weeks.  Any sutures or staples will be removed at the office during your follow-up visit. °8. ACTIVITIES:  You may resume regular daily activities (gradually increasing) beginning the next day.  Wearing a good support bra or sports bra minimizes pain and swelling.  You may have sexual intercourse when it is comfortable. °a. You may drive when you no longer are taking prescription pain medication, you can comfortably wear a seatbelt, and you can safely maneuver your car and apply brakes. °b. RETURN TO WORK:  ______________________________________________________________________________________ °9. You should see your doctor in the office for a follow-up appointment approximately two weeks after your surgery.  Your doctor’s nurse will typically make your follow-up appointment when she calls you with your pathology report.  Expect your pathology report 2-3 business days after your surgery.  You may call to check if you do not hear from us after three days. °10. OTHER INSTRUCTIONS: _______________________________________________________________________________________________ _____________________________________________________________________________________________________________________________________ °_____________________________________________________________________________________________________________________________________ °_____________________________________________________________________________________________________________________________________ ° °WHEN TO CALL YOUR DOCTOR: °1. Fever over 101.0 °2. Nausea and/or vomiting. °3. Extreme swelling or bruising. °4. Continued bleeding from incision. °5. Increased pain, redness, or drainage from the incision. ° °The clinic staff is available to answer your questions during regular business hours.  Please don’t hesitate to call and ask to speak to one of the nurses for clinical concerns.  If you have a medical emergency, go to the nearest emergency  room or call 911.  A surgeon from [facility] is always on call at the hospital. ° °For further questions, please visit [email or web address]  ° °  Post Anesthesia Home Care Instructions ° °Activity: °Get plenty of rest for the remainder of the day. A responsible individual must stay with you for 24 hours following the procedure.  °For the next 24 hours, DO NOT: °-Drive a car °-Operate machinery °-Drink alcoholic beverages °-Take any medication unless instructed by your physician °-Make any legal decisions or sign important papers. ° °Meals: °Start with liquid foods such as gelatin or soup. Progress to regular foods as tolerated. Avoid greasy, spicy, heavy foods. If nausea and/or vomiting occur, drink only clear liquids until the nausea and/or vomiting subsides. Call your physician if vomiting continues. ° °Special Instructions/Symptoms: °Your throat may feel dry or sore from the anesthesia or the breathing tube placed in your throat during surgery. If this causes discomfort, gargle with warm salt water. The discomfort should disappear within 24 hours. ° °If you had a scopolamine patch placed behind your ear for the management of post- operative nausea and/or vomiting: ° °1. The medication in the patch is effective for 72 hours, after which it should be removed.  Wrap patch in a tissue and discard in the trash. Wash hands thoroughly with soap and water. °2. You may remove the patch earlier than 72 hours if you experience unpleasant side effects which may include dry mouth, dizziness or visual disturbances. °3. Avoid touching the patch. Wash your hands with soap and water after contact with the patch. °  ° ° °

## 2018-01-12 ENCOUNTER — Encounter (HOSPITAL_BASED_OUTPATIENT_CLINIC_OR_DEPARTMENT_OTHER): Payer: Self-pay | Admitting: Surgery

## 2018-01-24 MED FILL — ?METFORMIN HCL ER 500 MG TA: 500 | 30 days supply | Qty: 120 | Fill #2

## 2018-01-24 MED FILL — ESCITALOPRAM 10 MG TABLET: 10 | 30 days supply | Qty: 30 | Fill #1

## 2018-01-24 MED FILL — hydrOXYzine HCL 25 MG TABS: 25 | 30 days supply | Qty: 60 | Fill #1

## 2018-01-27 ENCOUNTER — Encounter: Payer: Self-pay | Admitting: Radiation Oncology

## 2018-01-30 ENCOUNTER — Encounter: Payer: Self-pay | Admitting: Oncology

## 2018-01-30 ENCOUNTER — Telehealth: Payer: Self-pay | Admitting: Oncology

## 2018-01-30 ENCOUNTER — Encounter: Payer: Self-pay | Admitting: Genetics

## 2018-01-30 NOTE — Telephone Encounter (Signed)
New medonc and genetics refrral received from Dr. Adela Ports. Pt has been scheduled to see Dr. Jana Hakim on 9/10 at 4pm. And genetic counseling appt scheduled for the pt to see Ferol Luz on 9/12 at 3pm. Pt agreed to both dates and time. Letters mailed.

## 2018-02-01 ENCOUNTER — Encounter: Payer: Self-pay | Admitting: *Deleted

## 2018-02-01 NOTE — Progress Notes (Signed)
[place] Work  Clinical Social Work was referred by Etheleen Sia, RN, for transportation concerns. Clinical Social Worker contacted patient by phone  to offer support and assess for needs.  Ms. Lacewell reported she has lack of transportation to attend her appointments at cancer center.  CSW reviewed transportation resources including [facility]. Patient is interested in utilizing program- CSW made referral to Transportation Coordinator, Ginette Otto. CSW reviewed other resources and provided brief emotional support.   Gwinda Maine, LCSW  Clinical Social Worker [facility]

## 2018-02-06 ENCOUNTER — Other Ambulatory Visit: Payer: Self-pay | Admitting: Family Medicine

## 2018-02-06 ENCOUNTER — Telehealth (INDEPENDENT_AMBULATORY_CARE_PROVIDER_SITE_OTHER): Payer: Self-pay | Admitting: Physician Assistant

## 2018-02-06 ENCOUNTER — Other Ambulatory Visit (HOSPITAL_COMMUNITY): Payer: Self-pay | Admitting: Psychiatry

## 2018-02-06 DIAGNOSIS — F4312 Post-traumatic stress disorder, chronic: Secondary | ICD-10-CM

## 2018-02-06 MED FILL — lamoTRIgine 200 MG TABS: 200 | 30 days supply | Qty: 30 | Fill #5

## 2018-02-06 MED FILL — ?OMEPRazole 20mg CPDR: 20 | 30 days supply | Qty: 30 | Fill #3

## 2018-02-06 MED FILL — LOSARTAN-HCTZ 100-25 MG TAB: 100-25 | 30 days supply | Qty: 30 | Fill #3

## 2018-02-06 NOTE — Telephone Encounter (Signed)
Patient called stating that her PCP wanted her to come off the Trazodone and start taking hydroxyzine. Patient stated that she cannot sleep at night just taking hydroxyzine and would like to know if she can start taking the trazodone back. Please f/u

## 2018-02-06 NOTE — Telephone Encounter (Signed)
Try taking two hydroxyzine, then call back.

## 2018-02-06 NOTE — Telephone Encounter (Signed)
FWD to PCP. Hajar Penninger S Odysseus Cada, CMA  

## 2018-02-07 NOTE — Telephone Encounter (Signed)
Left message asking patient to call the office. Nat Christen, CMA

## 2018-02-08 ENCOUNTER — Ambulatory Visit (INDEPENDENT_AMBULATORY_CARE_PROVIDER_SITE_OTHER): Payer: No Typology Code available for payment source | Admitting: Physician Assistant

## 2018-02-08 ENCOUNTER — Other Ambulatory Visit: Payer: Self-pay | Admitting: Radiation Oncology

## 2018-02-08 ENCOUNTER — Encounter: Payer: Self-pay | Admitting: Adult Health

## 2018-02-08 ENCOUNTER — Encounter: Payer: Self-pay | Admitting: Radiation Oncology

## 2018-02-08 ENCOUNTER — Ambulatory Visit
Admission: RE | Admit: 2018-02-08 | Discharge: 2018-02-08 | Disposition: A | Discharge status: Home / Self Care | Payer: Self-pay | Source: Ambulatory Visit | Attending: Radiation Oncology | Admitting: Radiation Oncology

## 2018-02-08 ENCOUNTER — Other Ambulatory Visit: Payer: Self-pay

## 2018-02-08 VITALS — BP 137/83 | HR 72 | Temp 98.9°F | Resp 18 | Ht 64.0 in | Wt 225.6 lb

## 2018-02-08 DIAGNOSIS — C50311 Malignant neoplasm of lower-inner quadrant of right female breast: Secondary | ICD-10-CM | POA: Insufficient documentation

## 2018-02-08 DIAGNOSIS — Z170 Estrogen receptor positive status [ER+]: Principal | ICD-10-CM

## 2018-02-08 DIAGNOSIS — Z885 Allergy status to narcotic agent status: Secondary | ICD-10-CM | POA: Insufficient documentation

## 2018-02-08 DIAGNOSIS — F17210 Nicotine dependence, cigarettes, uncomplicated: Secondary | ICD-10-CM | POA: Insufficient documentation

## 2018-02-08 DIAGNOSIS — I10 Essential (primary) hypertension: Secondary | ICD-10-CM | POA: Insufficient documentation

## 2018-02-08 DIAGNOSIS — Z882 Allergy status to sulfonamides status: Secondary | ICD-10-CM | POA: Insufficient documentation

## 2018-02-08 DIAGNOSIS — E1140 Type 2 diabetes mellitus with diabetic neuropathy, unspecified: Secondary | ICD-10-CM | POA: Insufficient documentation

## 2018-02-08 DIAGNOSIS — Z79899 Other long term (current) drug therapy: Secondary | ICD-10-CM | POA: Insufficient documentation

## 2018-02-08 DIAGNOSIS — Z7984 Long term (current) use of oral hypoglycemic drugs: Secondary | ICD-10-CM | POA: Insufficient documentation

## 2018-02-08 HISTORY — DX: Post-traumatic stress disorder, unspecified: F43.10

## 2018-02-08 NOTE — Telephone Encounter (Signed)
Patient states taking the two hydroxyzine seem to be working better. Nat Christen, CMA

## 2018-02-08 NOTE — Telephone Encounter (Signed)
Noted  

## 2018-02-08 NOTE — Progress Notes (Signed)
Location of Breast Cancer: Right Breast  Did patient present with symptoms (if so, please note symptoms) or was this found on screening mammography?:  Patient had abnormal mammogram.  Very first one.  Histology per Pathology Report: Right Breast 01/11/2018  Receptor Status: ER(+ 100%), PR (+ 100%), Her2-neu (), Ki-()  Past/Anticipated interventions by surgeon, if any: Dr. Ninfa Linden 01/11/2018 -Right breast lumpectomy with radioactive seed localization. -This is a patient with a complex sclerosing lesion of the right breast. I discussed the diagnosis with the patient in detail. Given the pathologic findings as well as her family history, removal of this area is recommended. -I discussed a right breast radioactive seed guided lumpectomy with her in detail. We discussed the surgical procedure. We discussed the risks of surgery which includes but is not limited to bleeding, infection, injury to surrounding structures, the need for further procedures of malignancy is found, cardiopulmonary issues, postoperative recovery, DVT, etc. 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 -I had a long discussion with the patient and her family regarding the diagnosis. -We discussed anti-hormonal therapy.   - We discussed re-excision to get a negative margin versus a mastectomy. - Because of transportation issues, she is actually for a mastectomy. -Because of her family history I want to go ahead and refer her for genetics testing and we'll also have her see medical and radiation oncology for their opinion and so she can be completely informed about her options prior to any further surgery.  Past/Anticipated interventions by medical oncology, if any: Chemotherapy  Appointment scheduled to see Dr. Jana Hakim on 02/21/2018 at 4pm.  Lymphedema issues, if any:  No  Pain issues,  if any:  None in her breast, only low back pain.  BP 137/83 (BP Location: Left Arm, Patient Position: Sitting)   Pulse 72   Temp 98.9 F (37.2 C)   Resp 18   Ht _0  (1.626 m)   Wt 225 lb 9.6 oz (102.3 kg)   SpO2 95%   BMI 38.72 kg/m    Wt Readings from Last 3 Encounters:  02/08/18 225 lb 9.6 oz (102.3 kg)  01/11/18 226 lb 3.2 oz (102.6 kg)  12/28/17 228 lb 12.8 oz (103.8 kg)   SAFETY ISSUES:  Prior radiation? No  Pacemaker/ICD? No  Possible current pregnancy? No, tubal ligation  Is the patient on methotrexate? No  Current Complaints / other details:   Genetic Counseling scheduled with Ferol Luz 02/23/2018, Evalina Field, RN 02/08/2018,8:12 AM

## 2018-02-08 NOTE — Progress Notes (Signed)
Radiation Oncology         [phone] ________________________________  Name: Courtney Dixon        MRN: [id number]  Date of Service: 02/08/2018 DOB: March 21, 1966  KC:LEXNT, Lesli Albee, PA-C  Coralie Keens, MD     REFERRING PHYSICIAN: Coralie Keens, MD   DIAGNOSIS: The encounter diagnosis was Malignant neoplasm of lower-inner quadrant of right breast of female, estrogen receptor positive ([place]).   HISTORY OF PRESENT ILLNESS: Courtney Dixon is a 52 y.o. female seen in the multidisciplinary breast clinic for a new diagnosis of right breast cancer. The patient was noted to have an abnormality in the right breast in May 2019 during her first mammogram. This prompted diagnostic imaging on 11/08/2017 including ultrasound which revealed a smoothly marginated mass with a single benign-appearing calcification and on ultrasound this measured 2 x 1.7 x 1.9 cm at 2:30.  There is a circumscribed hypoechoic mass with a single echogenic focus consistent with calcification measuring 9 x 4 x 7 mm.  The axilla was negative for adenopathy.  She underwent ultrasound-guided biopsy on 11/11/2017 which revealed a complex sclerosing lesion with an intraductal papilloma and calcifications.  2 very o'clock a second biopsy was obtained on 11/16/2017 and fibroadenoma with calcifications and no malignancy identified.  She was counseled on the rationale to consider lumpectomy and underwent this procedure on 01/11/2018 which revealed an ER PR positive DCIS, intermediate grade arising in a complex sclerosing lesion this was noted at the medial margin, and she comes today to discuss options of treatment for her cancer.  She is trying to determine how she would like to proceed surgically.  She is also scheduled to meet with Dr. Jana Hakim on 02/21/2018 and with genetics on 02/23/2018. She comes to discuss the role of radiotherapy in the management of her disease.   PREVIOUS RADIATION THERAPY: No   PAST MEDICAL HISTORY:    Past Medical History:  Diagnosis Date  . Anxiety   . Depression   . Diabetes mellitus, type II ([place])   . Dyspnea   . Hypertension   . Migraines   . Neuropathy   . PTSD (post-traumatic stress disorder)        PAST SURGICAL HISTORY: Past Surgical History:  Procedure Laterality Date  . BREAST LUMPECTOMY WITH RADIOACTIVE SEED LOCALIZATION Right 01/11/2018   Procedure: BREAST LUMPECTOMY WITH RADIOACTIVE SEED LOCALIZATION;  Surgeon: Coralie Keens, MD;  Location: [facility];  Service: General;  Laterality: Right;  . CESAREAN SECTION    . TUBAL LIGATION       FAMILY HISTORY:  Family History  Problem Relation Age of Onset  . Drug abuse Mother   . Heart disease Mother   . Hypertension Mother   . Breast cancer Mother 56  . Cancer Father   . Diabetes Sister   . Breast cancer Maternal Aunt 2     SOCIAL HISTORY:  reports that she has been smoking cigarettes. She has a 36.00 pack-year smoking history. She has never used smokeless tobacco. She reports that she does not drink alcohol or use drugs.  The patient is single but in a relationship. She's accompanied by her fiance today. She is going to enroll in [place] as a result of her history of mental illness.   ALLERGIES: Codeine and Sulfa antibiotics   MEDICATIONS:  Current Outpatient Medications  Medication Sig Dispense Refill  . atorvastatin (LIPITOR) 40 MG tablet Take 1 tablet (40 mg total) by mouth daily. 40 tablet 5  .  Blood Glucose Monitoring Suppl (TRUE METRIX METER) w/Device KIT 1 each by Does not apply route as needed. 1 kit 0  . canagliflozin (INVOKANA) 100 MG TABS tablet Take 1 tablet (100 mg total) by mouth daily before breakfast. 30 tablet 5  . escitalopram (LEXAPRO) 10 MG tablet Take 1 tablet (10 mg total) by mouth daily. 30 tablet 2  . glimepiride (AMARYL) 4 MG tablet Take 2 tablets (8 mg total) by mouth daily with breakfast. 60 tablet 5  . glucose blood (TRUE METRIX BLOOD GLUCOSE TEST) test  strip 1 each by Other route 3 (three) times daily. 100 each 11  . hydrOXYzine (ATARAX/VISTARIL) 25 MG tablet Take 1 tablet (25 mg total) by mouth 2 (two) times daily. 60 tablet 1  . ibuprofen (ADVIL,MOTRIN) 200 MG tablet Take 600 mg by mouth 2 (two) times daily.    Marland Kitchen lamoTRIgine (LAMICTAL) 200 MG tablet Take 1 tablet (200 mg total) daily by mouth. 90 tablet 1  . losartan-hydrochlorothiazide (HYZAAR) 100-25 MG tablet Take 1 tablet by mouth daily. 30 tablet 5  . metFORMIN (GLUCOPHAGE-XR) 500 MG 24 hr tablet Take 2 tablets (1,000 mg total) by mouth 2 (two) times daily. 120 tablet 5  . omeprazole (PRILOSEC) 20 MG capsule TAKE 1 CAPSULE BY MOUTH DAILY. 30 capsule 3  . pregabalin (LYRICA) 100 MG capsule Take 1 capsule (100 mg total) by mouth 3 (three) times daily. For PASS dose change 270 capsule 1  . TRUEPLUS LANCETS 28G MISC 1 each by Does not apply route 3 (three) times daily. 100 each 11  . VENTOLIN HFA 108 (90 Base) MCG/ACT inhaler INHALE 2 PUFFS INTO THE LUNGS EVERY 6 (SIX) HOURS AS NEEDED FOR WHEEZING OR SHORTNESS OF BREATH. 18 g 0  . traZODone (DESYREL) 50 MG tablet Take 0.5 tablets (25 mg total) by mouth at bedtime as needed for sleep. (Patient not taking: Reported on 02/08/2018) 30 tablet 5   No current facility-administered medications for this encounter.      REVIEW OF SYSTEMS: On review of systems, the patient reports that she is doing well overall. She denies any chest pain, shortness of breath, cough, fevers, chills, night sweats, unintended weight changes.  She does have a history of multiple yeast infections and states that during the summer months, she has frequent yeast infections in the inframammary fold, she denies any concerns with this today.  She states that she has noticed some soreness since her lumpectomy in the right breast, but otherwise feels as though she is doing well.  She denies any bowel or bladder disturbances, and denies abdominal pain, nausea or vomiting. She denies any  new musculoskeletal or joint aches or pains. A complete review of systems is obtained and is otherwise negative.     PHYSICAL EXAM:  Wt Readings from Last 3 Encounters:  02/08/18 225 lb 9.6 oz (102.3 kg)  01/11/18 226 lb 3.2 oz (102.6 kg)  12/28/17 228 lb 12.8 oz (103.8 kg)   Temp Readings from Last 3 Encounters:  02/08/18 98.9 F (37.2 C)  01/11/18 98.7 F (37.1 C)  12/28/17 98 F (36.7 C) (Oral)   BP Readings from Last 3 Encounters:  02/08/18 137/83  01/11/18 111/69  12/28/17 132/88   Pulse Readings from Last 3 Encounters:  02/08/18 72  01/11/18 82  12/28/17 83     In general this is a well appearing Caucasian female in no acute distress. She is alert and oriented x4 and appropriate throughout the examination. HEENT reveals that the patient  is normocephalic, atraumatic. EOMs are intact. Cardiopulmonary assessment is negative for acute distress and she exhibits normal effort.     ECOG = 0  0 - Asymptomatic (Fully active, able to carry on all predisease activities without restriction)  1 - Symptomatic but completely ambulatory (Restricted in physically strenuous activity but ambulatory and able to carry out work of a light or sedentary nature. For example, light housework, office work)  2 - Symptomatic, <50% in bed during the day (Ambulatory and capable of all self care but unable to carry out any work activities. Up and about more than 50% of waking hours)  3 - Symptomatic, >50% in bed, but not bedbound (Capable of only limited self-care, confined to bed or chair 50% or more of waking hours)  4 - Bedbound (Completely disabled. Cannot carry on any self-care. Totally confined to bed or chair)  5 - Death   Eustace Pen MM, Creech RH, Tormey DC, et al. [phone]). "Toxicity and response criteria of the [facility]". [place] Oncol. 5 (6): 649-55    LABORATORY DATA:  Lab Results  Component Value Date   WBC 8.2 02/09/2014   HGB 13.5 02/09/2014   HCT  39.9 02/09/2014   MCV 88.3 02/09/2014   PLT 265 02/09/2014   Lab Results  Component Value Date   NA 142 01/06/2018   K 4.1 01/06/2018   CL 98 01/06/2018   CO2 30 01/06/2018   Lab Results  Component Value Date   ALT 46 (H) 04/07/2017   AST 48 (H) 04/07/2017   ALKPHOS 83 04/07/2017   BILITOT 0.2 04/07/2017      RADIOGRAPHY: Mm Breast Surgical Specimen  Result Date: 01/11/2018 CLINICAL DATA:  Biopsy-proven high risk complex sclerosing lesion and intraductal papilloma involving the LOWER INNER QUADRANT of the RIGHT breast. Radioactive seed localization was performed 01/09/2018. Excisional biopsy is performed today. EXAM: SPECIMEN RADIOGRAPH OF THE RIGHT BREAST COMPARISON:  Previous exam(s). FINDINGS: Status post excision of the right breast. The radioactive seed and the coil shaped tissue marker clip biopsy marker clip are present, completely intact, and are marked for pathology. This was discussed directly by telephone with the operating room nurse at the time of interpretation on 01/11/2018 at 7:55 a.m. IMPRESSION: Specimen radiograph of the right breast. Electronically Signed   By: Evangeline Dakin M.D.   On: 01/11/2018 08:01       IMPRESSION/PLAN: 1. Intermediate grade, ER/PR positive DCIS of the right breast.  I spoke with patient today regarding her biopsy, as well as her lumpectomy results.  Her case was discussed in conference and by myself and Dr. Lisbeth Renshaw this morning.  She does appear to need additional surgery to clear her positive medial margin, the options would include further lumpectomy followed by radiotherapy versus mastectomy, and the patient is leaning towards mastectomy.  We did discuss the rationale for utilization of radiation after lumpectomy, and discussed the risks, benefits, short and long-term effects of treatment, as well as the delivery and logistics of therapy.  Further discussion regarding the logistics reveals that the patient herself has concerns for her ability  to remain compliant during treatment, and hence she is interested in moving forward with mastectomy.  We did discuss the benefits of having social work to assist with transportation, though the patient states that some of her other comorbidities may keep her from being able to commit to daily courses of treatment.  She is also planning to follow-up with Dr. Jana Hakim, and we stressed the  importance of also being seen by genetics.  She is aware that genetic testing results could impact how she approaches treatment surgically as well as prophylactic surgeries potentially for the contralateral breast.  At the end of the conversation we left it that we will follow-up in conference following her surgical decision making, but would be happy to see her back if she ultimately chose lumpectomy.  She states agreement and understanding. 2. Possible genetic predisposition to malignancy. The patient is a candidate for genetic testing given her personal and family history. She is already scheduled to meet with genetics in a few weeks and was encouraged to attend this appointment.  In a visit lasting 45 minutes, greater than 50% of the time was spent face to face discussing her case, and coordinating the patient's care.     Carola Rhine, PAC

## 2018-02-09 ENCOUNTER — Encounter: Payer: Self-pay | Admitting: General Practice

## 2018-02-09 ENCOUNTER — Ambulatory Visit (INDEPENDENT_AMBULATORY_CARE_PROVIDER_SITE_OTHER): Payer: No Typology Code available for payment source | Admitting: Physician Assistant

## 2018-02-09 NOTE — Progress Notes (Signed)
[place] Psychosocial Distress Screening Clinical Social Work  Clinical Social Work was referred by distress screening protocol.  The patient scored a 6 on the Psychosocial Distress Thermometer which indicates moderate distress. Clinical Social Worker contacted patient by phone to assess for distress and other psychosocial needs. Unable to reach, left VM w information about [facility] and how to access.  CSW Wallis Bamberg has already connected w patient to address transportation needs.    ON[facility] DISTRESS SCREENING 02/08/2018  Screening Type Initial Screening  Distress experienced in past week (1-10) 6  Practical problem type Housing  Emotional problem type Depression;Nervousness/Anxiety;Adjusting to illness;Isolation/feeling alone;Feeling hopeless  Information Concerns Type Lack of info about diagnosis;Lack of info about treatment  Physical Problem type Pain;Sleep/insomnia;Tingling hands/feet  Other [phone]    Clinical Social Worker follow up needed: No.  If yes, follow up plan:  Beverely Pace, [place], LCSW Clinical Social Worker Phone:  [phone]

## 2018-02-10 ENCOUNTER — Other Ambulatory Visit: Payer: Self-pay | Admitting: Surgery

## 2018-02-14 ENCOUNTER — Telehealth: Payer: Self-pay | Admitting: Radiation Oncology

## 2018-02-14 NOTE — Telephone Encounter (Signed)
Spoke with patient regarding concerns about not being able to make an appointment to her surgeon on Thursday. Explained that we cannot provide rides for appointments that are not at the cancer center. Reached out to [place] to see about potential options for this appointment.

## 2018-02-15 ENCOUNTER — Telehealth: Payer: Self-pay | Admitting: General Practice

## 2018-02-15 NOTE — Telephone Encounter (Signed)
[place] CSW Progress Notes  Request received from [employer]olicitor - patient needs ride to [place] on 9/5 for appointment.  Reports difficulty arranging transportation.  Left VM for patient, can be referred to [place] [facility].  Normally requires 72 hours notice for rides, but may be able to arrange more quickly.  When hear back from patient, can submit referral on her behalf.  Edwyna Shell, LCSW Clinical Social Worker Phone:  [phone]

## 2018-02-20 NOTE — Progress Notes (Signed)
[place]  [phone] [phone]     ID: Courtney Dixon DOB: 09-Jan-1966  MR#: [id number]  MWU#:[id number]  Patient Care Team: Tawny Asal as PCP - General (Physician Assistant) Analyssa Downs, Virgie Dad, MD as Consulting Physician (Oncology) Coralie Keens, MD as Consulting Physician (General Surgery) Kyung Rudd, MD as Consulting Physician (Radiation Oncology) OTHER MD:  CHIEF COMPLAINT: Estrogen receptor positive ductal carcinoma in situ  CURRENT TREATMENT: Awaiting surgery for margin clearance   HISTORY OF CURRENT ILLNESS: Courtney Dixon had routine screening mammography on 11/03/2017 showing a possible abnormality in the right breast. She underwent unilateral right diagnostic mammography with tomography and right breast ultrasonography at [facility] on 11/08/2017 showing: breast density category B. There was a suspicious lesion in the right breast lower inner quadrant at 4 o'clock measuring 2.0 x 1.7 x 1.9 cm and located 3 cm the nipple. There was also a probably benign small mass in the 2:30 o'clock position of the right breast measuring 0.9 x 0.4 x 0.7 cm. Sonographic evaluation of the right axilla showed no enlarged or abnormal lymph nodes  Accordingly on 11/11/2017 she proceeded to biopsy of the right breast area in question. The pathology from this procedure showed ([id number]): a complex sclerosing lesion with an intraductal papilloma and calcifications.   A second biopsy ([id number]) of this are on 11/16/2017 showed: Fibroadenoma with calcifications and no evidence of malignancy.   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 Carcinoma extends to the medial margin. Prognostic indicators significant for: estrogen receptor, 100% positive and progesterone receptor, 100% positive, both with  strong staining intensity.   The patient's subsequent history is as detailed below.  INTERVAL HISTORY: Courtney Dixon was evaluated in the breast cancer clinic on 02/21/2018. Her case was also presented at the multidisciplinary breast cancer conference on 02/08/2018. At that time a preliminary plan was proposed: Reexcision for margin clearance, then adjuvant radiation, then antiestrogens    REVIEW OF SYSTEMS: There were no specific symptoms leading to the original mammogram, which was routinely scheduled. She has degenerative disk disease. She has chronic issues with depression and anxiety, and she previously saw Dr. Collene Leyden, but she has no psychiatrist at present. For exercise, she does not go walking in her neighborhood due to rough terrain, and she has sprained one of her ankles multiple times in the past. The patient denies unusual headaches, visual changes, nausea, vomiting, stiff neck, dizziness, or gait imbalance. There has been no cough, phlegm production, or pleurisy, no chest pain or pressure, and no change in bowel or bladder habits. The patient denies fever, rash, bleeding, unexplained fatigue or unexplained weight loss. A detailed review of systems was otherwise entirely negative.   PAST MEDICAL HISTORY: Past Medical History:  Diagnosis Date  . Anxiety   . Depression   . Diabetes mellitus, type II ([place])   . Dyspnea   . Hypertension   . Migraines   . Neuropathy   . PTSD (post-traumatic stress disorder)   HLD, diagnosed with chronic major depressive disorder, PTSD, and anxiety. Gallstones  PAST SURGICAL HISTORY: Past Surgical History:  Procedure Laterality Date  . BREAST LUMPECTOMY WITH RADIOACTIVE SEED LOCALIZATION Right 01/11/2018   Procedure: BREAST LUMPECTOMY WITH RADIOACTIVE SEED LOCALIZATION;  Surgeon: Coralie Keens, MD;  Location: [facility];  Service: General;  Laterality: Right;  . CESAREAN SECTION    . TUBAL LIGATION  FAMILY HISTORY Family History    Problem Relation Age of Onset  . Drug abuse Mother   . Heart disease Mother   . Hypertension Mother   . Breast cancer Mother 50  . Cancer Father   . Diabetes Sister   . Breast cancer Maternal Aunt 57  The patient's father died in the 29's due to metastatic lung cancer (heavy smoker). The patient's mother is alive at 63 and had a history of breast cancer diagnosed in her 21's. The patient has 2 brothers and 1 sister. The patient's brother had prostate cancer. There was a maternal aunt with breast cancer. There was a different maternal aunt with ovarian cancer. The patient notes that all of her maternal aunts and uncles died of various other types of cancers.     GYNECOLOGIC HISTORY:  No LMP recorded. Patient is postmenopausal. Menarche: [age] years old Age at first live birth: 52 years old She is GXP3. Her LMP was about 12 years ago.    SOCIAL HISTORY: (As of September 2019) [name] stays at home and helps take care of her disabled veteran cousin, [name]. Along with [name], the patient lives with her significant other, Hendricks Milo who works at [employer] in [place]. Wes has two children of his own: [name] age 46 who lives in [place], and [name] age 52 who lives in [place].  The patient notes that her sister will likely come live with her due to her sister having undergone aortic aneurysm repair and acquired MRSA. The patient has three sons who were raised by their father and live in [place]: [name] age 31, [name] age 24, and [name] age 45     ADVANCED DIRECTIVES: Not in place.  At the 02/21/2018 visit the patient was given the appropriate documents to complete and notarized at her discretion.  She tells me she intends to name [name] as her healthcare power of attorney   HEALTH MAINTENANCE: Social History   Tobacco Use  . Smoking status: Current Every Day Smoker    Packs/day: 1.00    Years: 36.00    Pack years: 36.00    Types: Cigarettes  .  Smokeless tobacco: Never Used  Substance Use Topics  . Alcohol use: No    Comment: Rarely uses   . Drug use: No     Colonoscopy: Never  PAP: Dr. Margarita Rana in 2018/ normal  Bone density: Never   Allergies  Allergen Reactions  . Codeine     Makes her anxious  . Sulfa Antibiotics Itching    Current Outpatient Medications  Medication Sig Dispense Refill  . atorvastatin (LIPITOR) 40 MG tablet Take 1 tablet (40 mg total) by mouth daily. 40 tablet 5  . Blood Glucose Monitoring Suppl (TRUE METRIX METER) w/Device KIT 1 each by Does not apply route as needed. 1 kit 0  . canagliflozin (INVOKANA) 100 MG TABS tablet Take 1 tablet (100 mg total) by mouth daily before breakfast. 30 tablet 5  . escitalopram (LEXAPRO) 10 MG tablet Take 1 tablet (10 mg total) by mouth daily. 30 tablet 2  . glimepiride (AMARYL) 4 MG tablet Take 2 tablets (8 mg total) by mouth daily with breakfast. 60 tablet 5  . glucose blood (TRUE METRIX BLOOD GLUCOSE TEST) test strip 1 each by Other route 3 (three) times daily. 100 each 11  . hydrOXYzine (ATARAX/VISTARIL) 25 MG tablet Take 1 tablet (25 mg total) by mouth 2 (two) times daily. 60 tablet 1  . ibuprofen (ADVIL,MOTRIN)  200 MG tablet Take 600 mg by mouth 2 (two) times daily.    Marland Kitchen lamoTRIgine (LAMICTAL) 200 MG tablet Take 1 tablet (200 mg total) daily by mouth. 90 tablet 1  . losartan-hydrochlorothiazide (HYZAAR) 100-25 MG tablet Take 1 tablet by mouth daily. 30 tablet 5  . metFORMIN (GLUCOPHAGE-XR) 500 MG 24 hr tablet Take 2 tablets (1,000 mg total) by mouth 2 (two) times daily. 120 tablet 5  . omeprazole (PRILOSEC) 20 MG capsule TAKE 1 CAPSULE BY MOUTH DAILY. 30 capsule 3  . pregabalin (LYRICA) 100 MG capsule Take 1 capsule (100 mg total) by mouth 3 (three) times daily. For PASS dose change 270 capsule 1  . TRUEPLUS LANCETS 28G MISC 1 each by Does not apply route 3 (three) times daily. 100 each 11  . VENTOLIN HFA 108 (90 Base) MCG/ACT inhaler INHALE 2 PUFFS INTO THE LUNGS  EVERY 6 (SIX) HOURS AS NEEDED FOR WHEEZING OR SHORTNESS OF BREATH. 18 g 0   No current facility-administered medications for this visit.     OBJECTIVE: Middle-aged white woman in no acute distress  Vitals:   02/21/18 1555  BP: 117/76  Pulse: 81  Resp: 18  Temp: 99.2 F (37.3 C)  SpO2: 96%     Body mass index is 38.71 kg/m.   Wt Readings from Last 3 Encounters:  02/21/18 225 lb 8 oz (102.3 kg)  02/08/18 225 lb 9.6 oz (102.3 kg)  01/11/18 226 lb 3.2 oz (102.6 kg)      ECOG FS:0 - Asymptomatic  Ocular: Sclerae unicteric, pupils round and equal Ear-nose-throat: Teeth in poor repair Lymphatic: No cervical or supraclavicular adenopathy Lungs no rales or rhonchi Heart regular rate and rhythm Abd soft, nontender, positive bowel sounds MSK no focal spinal tenderness, no joint edema Neuro: non-focal, well-oriented, appropriate affect Breasts: The right breast is status post recent lumpectomy.  The incision is healing very nicely, with no erythema, swelling, or dehiscence.  The cosmetic result is very good.  The left breast is benign.  Both axillae are benign.   LAB RESULTS:  CMP     Component Value Date/Time   NA 142 01/06/2018 1540   NA 140 11/02/2017 1558   K 4.1 01/06/2018 1540   CL 98 01/06/2018 1540   CO2 30 01/06/2018 1540   GLUCOSE 85 01/06/2018 1540   BUN 9 01/06/2018 1540   BUN 11 11/02/2017 1558   CREATININE 0.76 01/06/2018 1540   CREATININE 0.64 10/21/2015 1538   CALCIUM 10.0 01/06/2018 1540   PROT 6.8 04/07/2017 0834   ALBUMIN 4.0 04/07/2017 0834   AST 48 (H) 04/07/2017 0834   ALT 46 (H) 04/07/2017 0834   ALKPHOS 83 04/07/2017 0834   BILITOT 0.2 04/07/2017 0834   GFRNONAA >60 01/06/2018 1540   GFRNONAA >89 10/21/2015 1538   GFRAA >60 01/06/2018 1540   GFRAA >89 10/21/2015 1538    No results found for: TOTALPROTELP, ALBUMINELP, A1GS, A2GS, BETS, BETA2SER, GAMS, MSPIKE, SPEI  No results found for: KPAFRELGTCHN, LAMBDASER, KAPLAMBRATIO  Lab Results    Component Value Date   WBC 8.2 02/09/2014   NEUTROABS 5.0 02/09/2014   HGB 13.5 02/09/2014   HCT 39.9 02/09/2014   MCV 88.3 02/09/2014   PLT 265 02/09/2014    '@LASTCHEMISTRY'$ @  No results found for: LABCA2  No components found for: [id number]  No results for input(s): INR in the last 168 hours.  No results found for: LABCA2  No results found for: HFW263  No results found for: ZCH885  No results found for: LZJ673  No results found for: CA2729  No components found for: HGQUANT  No results found for: CEA1 / No results found for: CEA1   No results found for: AFPTUMOR  No results found for: CHROMOGRNA  No results found for: PSA1  No visits with results within 3 Day(s) from this visit.  Latest known visit with results is:  Admission on 01/11/2018, Discharged on 01/11/2018  Component Date Value Ref Range Status  . Sodium 01/06/2018 142  135 - 145 mmol/L Final  . Potassium 01/06/2018 4.1  3.5 - 5.1 mmol/L Final  . Chloride 01/06/2018 98  98 - 111 mmol/L Final  . CO2 01/06/2018 30  22 - 32 mmol/L Final  . Glucose, Bld 01/06/2018 85  70 - 99 mg/dL Final  . BUN 01/06/2018 9  6 - 20 mg/dL Final  . Creatinine, Ser 01/06/2018 0.76  0.44 - 1.00 mg/dL Final  . Calcium 01/06/2018 10.0  8.9 - 10.3 mg/dL Final  . GFR calc non Af Amer 01/06/2018 >60  >60 mL/min Final  . GFR calc Af Amer 01/06/2018 >60  >60 mL/min Final   Comment: (NOTE) The eGFR has been calculated using the CKD EPI equation. This calculation has not been validated in all clinical situations. eGFR's persistently <60 mL/min signify possible Chronic Kidney Disease.   . Anion gap 01/06/2018 14  5 - 15 Final   Performed at [facility], [place] [address], [place], [place] [address]  . Glucose-Capillary 01/11/2018 227* 70 - 99 mg/dL Final  . Glucose-Capillary 01/11/2018 102* 70 - 99 mg/dL Final    (this displays the last labs from the last 3 days)  No results found for: TOTALPROTELP, ALBUMINELP, A1GS, A2GS,  BETS, BETA2SER, GAMS, MSPIKE, SPEI (this displays SPEP labs)  No results found for: KPAFRELGTCHN, LAMBDASER, KAPLAMBRATIO (kappa/lambda light chains)  No results found for: HGBA, HGBA2QUANT, HGBFQUANT, HGBSQUAN (Hemoglobinopathy evaluation)   No results found for: LDH  No results found for: IRON, TIBC, IRONPCTSAT (Iron and TIBC)  No results found for: FERRITIN  Urinalysis    Component Value Date/Time   COLORURINE YELLOW 02/09/2014 0046   APPEARANCEUR CLEAR 02/09/2014 0046   LABSPEC 1.016 02/09/2014 0046   PHURINE 5.5 02/09/2014 0046   GLUCOSEU NEGATIVE 02/09/2014 0046   HGBUR NEGATIVE 02/09/2014 0046   BILIRUBINUR NEGATIVE 02/09/2014 0046   KETONESUR NEGATIVE 02/09/2014 0046   PROTEINUR NEGATIVE 02/09/2014 0046   UROBILINOGEN 0.2 02/09/2014 0046   NITRITE POSITIVE (A) 02/09/2014 0046   LEUKOCYTESUR SMALL (A) 02/09/2014 0046     STUDIES: Outside reports reviewed with the patient  ELIGIBLE FOR AVAILABLE RESEARCH PROTOCOL: no  ASSESSMENT: 52 y.o. [place], [place] woman status post right lumpectomy 01/11/2018 for ductal carcinoma in situ, grade 2, measuring 0.6 cm, strongly estrogen and progesterone receptor positive, with a positive medial margin  (1) genetics testing 02/23/2018  (2) additional surgery for margin clearance  (3) patient refuses radiation  (4) to start tamoxifen 04/25/2018  PLAN: I spent approximately 60 minutes face to face with Courtney Dixon with more than 50% of that time spent in counseling and coordination of care.  Bert understands that in noninvasive ductal carcinoma, also called ductal carcinoma in situ ("DCIS") the breast cancer cells remain trapped in the ducts were they started. They cannot travel to a vital organ. For that reason these cancers in themselves are not life-threatening.  If the whole breast is removed then all the ducts are removed and since the cancer cells are trapped  in the ducts, the cure rate with mastectomy for noninvasive breast  cancer is approximately 99%. Nevertheless we recommended lumpectomy, because there is no survival advantage to mastectomy and because the cosmetic result is generally superior with breast conservation.  She understands that she does have a positive margin, meaning there may be some residual disease in the breast and she will need additional surgery for that.  Since the patient is keeping her breast, there will be some risk of recurrence. The recurrence can only be in the same breast since, again, the cells are trapped in the ducts. There is no connection from one breast to the other. The risk of local recurrence is cut by more than half with radiation, which is standard in this situation.  This was discussed today with the radiation oncology group and at this point the patient opts against radiation  In estrogen receptor positive cancers like this 1, anti-estrogens can also be considered. They will reduce the risk of recurrence by one half. In addition anti-estrogens will lower the risk of a new breast cancer developing in either breast, also by one half. That risk otherwise approaches 1% per year.   Finally, the patient does qualify for genetics testing.  In patients who carry a deleterious mutation [for example in a  BRCA gene], the risk of a new breast cancer developing in the future may be sufficiently great that the patient may choose bilateral mastectomies. However if she wishes to keep her breasts in that situation it is safe to do so. That would require intensified screening, which generally means not only yearly mammography but a yearly breast MRI as well. Of course, if there is a deleterious mutation bilateral oophorectomy would be necessary as there is no standard screening protocol for ovarian cancer.  The plan then is for genetics testing on 02/23/2018.  If positive, Courtney Dixon is sure that she would want bilateral mastectomies.  Accordingly she will see Dr. Ninfa Linden again 03/10/2018, by which time  the genetics results should be available, and they can make a definitive decision whether they will simply do additional margin clearance or bilateral mastectomies  Once Courtney Dixon recovers from the surgery, she will start tamoxifen.  Today we discussed the possible toxicity side effects and complications of that drug as well as how it is different from anastrozole.  I think it would be prudent to start tamoxifen April 25, 2018 which will give her time to get over her surgery  She will then see me again in February.  If she is tolerating tamoxifen well the plan will be to continue that a total of 5 years.  Otherwise we will consider switching to anastrozole.  Courtney Dixon wanted me to refill her trazodone today and I was glad to do that for her.  She understands however that we are not psychiatrist or counselor's year and it would be very advisable for her to establish herself with a psychiatrist particularly if she does apply for Medicaid as she is planning to do.  Courtney Dixon has a good understanding of the overall plan. She agrees with it. She knows the goal of treatment in her case is cure. She will call with any problems that may develop before her next visit here.    Courtney Dixon, Virgie Dad, MD  02/21/18 4:53 PM Medical Oncology and Hematology [facility] [address] [place], [place] [address] Tel. [phone]    Fax. [phone]  Alice Rieger, am acting as scribe for Chauncey Cruel MD.  I, Lurline Del MD, have reviewed the above documentation for accuracy and completeness, and I agree with the above.

## 2018-02-21 ENCOUNTER — Inpatient Hospital Stay: Payer: Medicaid Other | Attending: Oncology | Admitting: Oncology

## 2018-02-21 VITALS — BP 117/76 | HR 81 | Temp 99.2°F | Resp 18 | Ht 64.0 in | Wt 225.5 lb

## 2018-02-21 DIAGNOSIS — Z8042 Family history of malignant neoplasm of prostate: Secondary | ICD-10-CM | POA: Insufficient documentation

## 2018-02-21 DIAGNOSIS — F418 Other specified anxiety disorders: Secondary | ICD-10-CM | POA: Diagnosis not present

## 2018-02-21 DIAGNOSIS — F329 Major depressive disorder, single episode, unspecified: Secondary | ICD-10-CM | POA: Diagnosis not present

## 2018-02-21 DIAGNOSIS — Z8041 Family history of malignant neoplasm of ovary: Secondary | ICD-10-CM | POA: Insufficient documentation

## 2018-02-21 DIAGNOSIS — Z720 Tobacco use: Secondary | ICD-10-CM | POA: Diagnosis not present

## 2018-02-21 DIAGNOSIS — Z803 Family history of malignant neoplasm of breast: Secondary | ICD-10-CM | POA: Insufficient documentation

## 2018-02-21 DIAGNOSIS — D0511 Intraductal carcinoma in situ of right breast: Secondary | ICD-10-CM

## 2018-02-21 DIAGNOSIS — Z170 Estrogen receptor positive status [ER+]: Secondary | ICD-10-CM | POA: Diagnosis not present

## 2018-02-21 DIAGNOSIS — F419 Anxiety disorder, unspecified: Secondary | ICD-10-CM | POA: Diagnosis not present

## 2018-02-21 DIAGNOSIS — D0512 Intraductal carcinoma in situ of left breast: Secondary | ICD-10-CM | POA: Diagnosis present

## 2018-02-21 DIAGNOSIS — C50311 Malignant neoplasm of lower-inner quadrant of right female breast: Secondary | ICD-10-CM

## 2018-02-21 MED ORDER — TAMOXIFEN CITRATE 20 MG PO TABS: 20.0000 mg | ORAL_TABLET | Freq: Every day | ORAL | 12 refills | Status: DC

## 2018-02-21 MED ORDER — VENLAFAXINE HCL ER 75 MG PO CP24: 75.0000 mg | ORAL_CAPSULE | Freq: Every day | ORAL | 4 refills | Status: DC

## 2018-02-21 MED ORDER — TRAZODONE HCL 50 MG PO TABS: 50.0000 mg | ORAL_TABLET | Freq: Every day | ORAL | 3 refills | Status: AC

## 2018-02-22 ENCOUNTER — Encounter: Payer: Self-pay | Admitting: *Deleted

## 2018-02-22 MED FILL — TAMOXIFEN CITRATE 20 MG TAB: 20 | 30 days supply | Qty: 30 | Fill #0

## 2018-02-22 MED FILL — VENLAFAXINE HCL ER 75 MG CA: 75 | 30 days supply | Qty: 30 | Fill #0

## 2018-02-22 MED FILL — ?TRAZODONE HCL 50MG TABS: 50 | 30 days supply | Qty: 30 | Fill #0

## 2018-02-23 ENCOUNTER — Encounter: Payer: Self-pay | Admitting: Genetics

## 2018-02-23 ENCOUNTER — Inpatient Hospital Stay (HOSPITAL_BASED_OUTPATIENT_CLINIC_OR_DEPARTMENT_OTHER): Payer: Medicaid Other | Admitting: Genetics

## 2018-02-23 ENCOUNTER — Inpatient Hospital Stay: Payer: Medicaid Other

## 2018-02-23 DIAGNOSIS — Z8052 Family history of malignant neoplasm of bladder: Secondary | ICD-10-CM

## 2018-02-23 DIAGNOSIS — D0511 Intraductal carcinoma in situ of right breast: Secondary | ICD-10-CM

## 2018-02-23 DIAGNOSIS — Z170 Estrogen receptor positive status [ER+]: Principal | ICD-10-CM

## 2018-02-23 DIAGNOSIS — Z801 Family history of malignant neoplasm of trachea, bronchus and lung: Secondary | ICD-10-CM | POA: Insufficient documentation

## 2018-02-23 DIAGNOSIS — Z803 Family history of malignant neoplasm of breast: Secondary | ICD-10-CM | POA: Insufficient documentation

## 2018-02-23 DIAGNOSIS — Z8042 Family history of malignant neoplasm of prostate: Secondary | ICD-10-CM

## 2018-02-23 DIAGNOSIS — Z8041 Family history of malignant neoplasm of ovary: Secondary | ICD-10-CM | POA: Insufficient documentation

## 2018-02-23 DIAGNOSIS — Z315 Encounter for genetic counseling: Secondary | ICD-10-CM

## 2018-02-23 DIAGNOSIS — Z808 Family history of malignant neoplasm of other organs or systems: Secondary | ICD-10-CM | POA: Insufficient documentation

## 2018-02-23 DIAGNOSIS — C50311 Malignant neoplasm of lower-inner quadrant of right female breast: Secondary | ICD-10-CM

## 2018-02-23 NOTE — Progress Notes (Signed)
REFERRING PROVIDER: Chauncey Cruel, MD [address] [place], [place] [address]  PRIMARY PROVIDER:  Clent Demark, PA-C  PRIMARY REASON FOR VISIT:  1. Malignant neoplasm of lower-inner quadrant of right breast of female, estrogen receptor positive ([place])   2. Family history of breast cancer   3. Family history of ovarian cancer   4. Family history of bladder cancer   5. Family history of prostate cancer   6. Family history of brain cancer   7. Family history of skin cancer   8. Family history of bone cancer   9. Family history of lung cancer     HISTORY OF PRESENT ILLNESS:   Courtney Dixon, a 52 y.o. female, was seen for a [facility] cancer genetics consultation at the request of Dr. Jana Hakim due to a personal and family history of cancer.  Courtney Dixon presents to clinic today to discuss the possibility of a hereditary predisposition to cancer, genetic testing, and to further clarify her future cancer risks, as well as potential cancer risks for family members.   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 This was found when she underwent lumpectomy for complex sclerosing lesion and intraductal papilloma on 01/11/2018. She is going to be meeting with her surgeon Dr. Ninfa Linden on 9/27 to discuss further surgery.  Courtney Dixon says that if this genetic test is positive, she would elect to have a bilateral mastectomy.    CANCER HISTORY:    Malignant neoplasm of lower-inner quadrant of right breast of female, estrogen receptor positive ([place])   02/08/2018 Initial Diagnosis    Malignant neoplasm of lower-inner quadrant of right breast of female, estrogen receptor positive ([place])    02/08/2018 Cancer Staging    Staging form: Breast, AJCC 8th Edition - Pathologic: Stage 0 (pTis (DCIS), pN0, cM0, ER+, PR+) - Signed by Gardenia Phlegm, NP on 02/08/2018     HORMONAL RISK FACTORS:  Menarche was at age [age].  First live birth at age 44.   Ovaries intact: yes.  Hysterectomy: no.  Menopausal status: postmenopausal.  Colonoscopy: no; not examined. Patient has had stool test.   Past Medical History:  Diagnosis Date  . Anxiety   . Depression   . Diabetes mellitus, type II ([place])   . Dyspnea   . Family history of bladder cancer   . Family history of bone cancer   . Family history of brain cancer   . Family history of breast cancer   . Family history of lung cancer   . Family history of ovarian cancer   . Family history of prostate cancer   . Family history of skin cancer   . Hypertension   . Migraines   . Neuropathy   . PTSD (post-traumatic stress disorder)     Past Surgical History:  Procedure Laterality Date  . BREAST LUMPECTOMY WITH RADIOACTIVE SEED LOCALIZATION Right 01/11/2018   Procedure: BREAST LUMPECTOMY WITH RADIOACTIVE SEED LOCALIZATION;  Surgeon: Coralie Keens, MD;  Location: [facility];  Service: General;  Laterality: Right;  . CESAREAN SECTION    . TUBAL LIGATION      Social History   Socioeconomic History  . Marital status: Married    Spouse name: Not on file  . Number of children: Not on file  . Years of education: Not on file  . Highest education level: Not on file  Occupational History  . Not on file  Social Needs  .  Financial resource strain: Not on file  . Food insecurity:    Worry: Not on file    Inability: Not on file  . Transportation needs:    Medical: Not on file    Non-medical: Not on file  Tobacco Use  . Smoking status: Current Every Day Smoker    Packs/day: 1.00    Years: 36.00    Pack years: 36.00    Types: Cigarettes  . Smokeless tobacco: Never Used  Substance and Sexual Activity  . Alcohol use: No    Comment: Rarely uses   . Drug use: No  . Sexual activity: Yes    Partners: Male    Birth control/protection: Surgical  Lifestyle  . Physical activity:    Days per week: Not on file    Minutes per session: Not on file  . Stress: Not on file   Relationships  . Social connections:    Talks on phone: Not on file    Gets together: Not on file    Attends religious service: Not on file    Active member of club or organization: Not on file    Attends meetings of clubs or organizations: Not on file    Relationship status: Not on file  Other Topics Concern  . Not on file  Social History Narrative   Unable to ask intimate partner violence questions, partner present     FAMILY HISTORY:  We obtained a detailed, 4-generation family history.  Significant diagnoses are listed below: Family History  Problem Relation Age of Onset  . Drug abuse Mother   . Heart disease Mother   . Hypertension Mother   . Breast cancer Mother 57  . Lung cancer Father   . Aortic aneurysm Father   . Diabetes Sister   . Aortic aneurysm Sister   . Breast cancer Maternal Aunt 70  . Prostate cancer Brother        dx early 70's  . Bladder Cancer Maternal Uncle   . Skin cancer Maternal Grandfather   . Bone cancer Maternal Grandfather   . Ovarian cancer Maternal Aunt        dx 50's/60's  . Brain cancer Maternal Aunt 55  . Skin cancer Cousin    Courtney Dixon has 2 sons ages 31, 34, and 10 with no history of cancer.  No grandchildren at this time.  Courtney Dixon has a brother who is 55 and sister who is 66 with MS and recently had an aortic aneurysm.  Courtney Dixon has a maternal half-brother who is 28 and has a history of prostate cancer dx in his early 38's.   Courtney Dixon father: died of lung cancer. Hx of smoking. He had an aortic aneurysm.  Paternal aunts/Uncles: unk Paternal cousins: unk Paternal grandfather: unk Paternal grandmother:died in childbirth  Courtney Dixon mother: 21, hx of breast cancer dx in her 70's Maternal Aunts/Uncles: 3 maternal aunts and 1 maternal uncle: -1 maternal aunt had breast cancer dx in her 44's and is now in her mid 65's.  -1 maternal aunt died of ovarian cancer in her 86's/60's -1 maternal aunt died of brain cancer in her  44's -1 maternal uncle bladder cancer Maternal cousins: 1 maternal cousin with skin cancer Maternal grandfather: died in his 68's? He had skin and bone cancer.  Maternal grandmother:unk cause of death, died in 53's?  Courtney Dixon is unaware of previous family history of genetic testing for hereditary cancer risks. Patient's maternal ancestors are of Caucasian descent, and paternal  ancestors are of Caucasian descent. There is no reported Ashkenazi Jewish ancestry. There is no known consanguinity.  GENETIC COUNSELING ASSESSMENT: Naketa Daddario is a 52 y.o. female with a personal and family history which is somewhat suggestive of a Hereditary Cancer Predisposition Syndrome. We, therefore, discussed and recommended the following at today's visit.   DISCUSSION: We reviewed the characteristics, features and inheritance patterns of hereditary cancer syndromes. We also discussed genetic testing, including the appropriate family members to test, the process of testing, insurance coverage and turn-around-time for results. We discussed the implications of a negative, positive and/or variant of uncertain significant result. In order to get genetic test results in a timely manner so that Courtney Dixon can use these genetic test results for surgical decisions, we recommended Courtney Dixon pursue genetic testing for the Breast Cancer STAT panel. We then reccomemndedMs. Berline Dixon pursue reflex genetic testing to the Multi-Cancer gene panel.   The Multi-Cancer Panel offered by [facility] includes sequencing and/or deletion duplication testing of the following 91 genes: AIP, ALK, APC, ATM, AXIN2, BAP1, BARD1, BLM, BMPR1A, BRCA1, BRCA2, BRIP1, BUB1B, CASR, CDC73, CDH1, CDK4, CDKN1B, CDKN1C, CDKN2A, CEBPA, CEP57, CHEK2, CTNNA1, DICER1, DIS3L2, EGFR, ENG, EPCAM, FH, FLCN, GALNT12, GATA2, GPC3, GREM1, HOXB13, HRAS, KIT, MAX, MEN1, MET, MITF, MLH1, MLH3, MSH2, MSH3, MSH6, MUTYH, NBN, NF1, NF2, NTHL1, PALB2, PDGFRA, PHOX2B, PMS2, POLD1,  POLE, POT1, PRKAR1A, PTCH1, PTEN, RAD50, RAD51C, RAD51D, RB1, RECQL4, RET, RNF43, RPS20, RUNX1, SDHA, SDHAF2, SDHB, SDHC, SDHD, SMAD4, SMARCA4, SMARCB1, SMARCE1, STK11, SUFU, TERC, TERT, TMEM127, TP53, TSC1, TSC2, VHL, WRN, WT1  We discussed that only 5-10% of cancers are associated with a Hereditary cancer predisposition syndrome.  One of the most common hereditary cancer syndromes that increases breast cancer risk is called Hereditary Breast and Ovarian Cancer (HBOC) syndrome.  This syndrome is caused by mutations in the BRCA1 and BRCA2 genes.  This syndrome increases an individual's lifetime risk to develop breast, ovarian, pancreatic, and other types of cancer.  There are also many other cancer predisposition syndromes caused by mutations in several other genes.  We discussed that if she is found to have a mutation in one of these genes, it may impact surgical decisions, and alter future medical management recommendations such as increased cancer screenings and consideration of risk reducing surgeries.  A positive result could also have implications for the patient's family members.  A Negative result would mean we were unable to identify a hereditary component to her cancer, but does not rule out the possibility of a hereditary basis for her cancer.  There could be mutations that are undetectable by current technology, or in genes not yet tested or identified to increase cancer risk.    We discussed the potential to find a Variant of Uncertain Significance or VUS.  These are variants that have not yet been identified as pathogenic or benign, and it is unknown if this variant is associated with increased cancer risk or if this is a normal finding.  Most VUS's are reclassified to benign or likely benign.   It should not be used to make medical management decisions. With time, we suspect the lab will determine the significance of any VUS's identified if any.   Based on Courtney Dixon's personal and family  history of cancer, she meets medical criteria for genetic testing. Despite that she meets criteria, she may still have an out of pocket cost. The laboratory can provide her with an estimate of her OOP cost. she was given the contact information of  the laboratory if she has further questions.  She filled out the [facility] PAP application to waive the cost of her test.   We did discuss that aortic aneurysms can be caused by genetic mutations, most often not, but she should inform her doctors about her family history (in sister and father).  They can determine if monitoring/referral/ further evaluation is necessary.  Her sister could also discuss familial risk with her doctors.    PLAN: After considering the risks, benefits, and limitations, Courtney Dixon  provided informed consent to pursue genetic testing and the blood sample was sent to [facility] for analysis of the Breast Cancer STAT panel with plans to reflex to the Multi-Cancer Panel. Results should be available within approximately 2-3 weeks' time, at which point they will be disclosed by telephone to Courtney Dixon, as will any additional recommendations warranted by these results. Courtney Dixon will receive a summary of her genetic counseling visit and a copy of her results once available. This information will also be available in Epic. We encouraged Courtney Dixon to remain in contact with cancer genetics annually so that we can continuously update the family history and inform her of any changes in cancer genetics and testing that may be of benefit for her family. Courtney Dixon questions were answered to her satisfaction today. Our contact information was provided should additional questions or concerns arise.  Based on Ms. Keeny's family history, we recommended her siblings/maternal relatives (especially those affected with cancer), have genetic counseling and testing. Ms. Piontek will let us know if we can be of any assistance in coordinating genetic  counseling and/or testing for this family member.   Lastly, we encouraged Ms. Folkerts to remain in contact with cancer genetics annually so that we can continuously update the family history and inform her of any changes in cancer genetics and testing that may be of benefit for this family.   Ms.  Rosiles questions were answered to her satisfaction today. Our contact information was provided should additional questions or concerns arise. Thank you for the referral and allowing [place] to share in the care of your patient.   Tana Felts, MS, [facility] Certified Genetic Counselor [email or web address] phone: [phone]  The patient was seen for a total of 40 minutes in face-to-face genetic counseling. This patient was discussed with Drs. Magrinat, Lindi Adie and/or Burr Medico who agrees with the above.

## 2018-02-27 ENCOUNTER — Other Ambulatory Visit (INDEPENDENT_AMBULATORY_CARE_PROVIDER_SITE_OTHER): Payer: Self-pay | Admitting: Physician Assistant

## 2018-02-27 ENCOUNTER — Telehealth (INDEPENDENT_AMBULATORY_CARE_PROVIDER_SITE_OTHER): Payer: Self-pay | Admitting: Physician Assistant

## 2018-02-27 DIAGNOSIS — G4700 Insomnia, unspecified: Secondary | ICD-10-CM

## 2018-02-27 MED FILL — ESCITALOPRAM 10 MG TABLET: 10 | 30 days supply | Qty: 30 | Fill #2

## 2018-02-27 MED FILL — hydrOXYzine HCL 25 MG TABS: 25 | 30 days supply | Qty: 60 | Fill #0

## 2018-02-27 NOTE — Telephone Encounter (Signed)
FWD to PCP. Courtney Dixon S Lynwood Kubisiak, CMA  

## 2018-02-27 NOTE — Telephone Encounter (Signed)
FWD to PCP. Anijah Spohr S Jarrod Mcenery, CMA  

## 2018-02-27 NOTE — Telephone Encounter (Signed)
Patient called requesting medication refill for   hydrOXYzine (ATARAX/VISTARIL) 25 MG tablet   Patient uses [facility]   Please Advice [phone]  Thank you Emmit Pomfret

## 2018-02-27 NOTE — Telephone Encounter (Signed)
I have already responded to this today. Refill has been made. Please have patient contact the pharmacy for refill requests.

## 2018-02-28 NOTE — Telephone Encounter (Signed)
Noted. Courtney Dixon

## 2018-03-06 ENCOUNTER — Telehealth: Payer: Self-pay | Admitting: Genetics

## 2018-03-06 ENCOUNTER — Ambulatory Visit: Payer: Self-pay | Admitting: Genetics

## 2018-03-06 ENCOUNTER — Encounter: Payer: Self-pay | Admitting: Genetics

## 2018-03-06 DIAGNOSIS — Z803 Family history of malignant neoplasm of breast: Secondary | ICD-10-CM

## 2018-03-06 DIAGNOSIS — Z8041 Family history of malignant neoplasm of ovary: Secondary | ICD-10-CM

## 2018-03-06 DIAGNOSIS — C50311 Malignant neoplasm of lower-inner quadrant of right female breast: Secondary | ICD-10-CM

## 2018-03-06 DIAGNOSIS — Z1379 Encounter for other screening for genetic and chromosomal anomalies: Secondary | ICD-10-CM | POA: Insufficient documentation

## 2018-03-06 DIAGNOSIS — Z8042 Family history of malignant neoplasm of prostate: Secondary | ICD-10-CM

## 2018-03-06 DIAGNOSIS — Z808 Family history of malignant neoplasm of other organs or systems: Secondary | ICD-10-CM

## 2018-03-06 DIAGNOSIS — Z801 Family history of malignant neoplasm of trachea, bronchus and lung: Secondary | ICD-10-CM

## 2018-03-06 DIAGNOSIS — Z8052 Family history of malignant neoplasm of bladder: Secondary | ICD-10-CM

## 2018-03-06 DIAGNOSIS — Z170 Estrogen receptor positive status [ER+]: Secondary | ICD-10-CM

## 2018-03-06 NOTE — Progress Notes (Signed)
HPI:  Ms. Stambaugh was previously seen in the [facility] clinic on 02/23/2018 due to a personal and family history of cancer and concerns regarding a hereditary predisposition to cancer. Please refer to our prior cancer genetics clinic note for more information regarding Ms. Needs's medical, social and family histories, and our assessment and recommendations, at the time. Ms. Shevchenko recent genetic test results were disclosed to her, as well as recommendations warranted by these results. These results and recommendations are discussed in more detail below.  CANCER HISTORY:    Malignant neoplasm of lower-inner quadrant of right breast of female, estrogen receptor positive ([place])   02/08/2018 Initial Diagnosis    Malignant neoplasm of lower-inner quadrant of right breast of female, estrogen receptor positive ([place])    02/08/2018 Cancer Staging    Staging form: Breast, AJCC 8th Edition - Pathologic: Stage 0 (pTis (DCIS), pN0, cM0, ER+, PR+) - Signed by Gardenia Phlegm, NP on 02/08/2018    03/06/2018 Genetic Testing    The Multi-Cancer Panel offered by [facility] includes sequencing and/or deletion duplication testing of the following 91 genes: AIP, ALK, APC, ATM, AXIN2, BAP1, BARD1, BLM, BMPR1A, BRCA1, BRCA2, BRIP1, BUB1B, CASR, CDC73, CDH1, CDK4, CDKN1B, CDKN1C, CDKN2A, CEBPA, CEP57, CHEK2, CTNNA1, DICER1, DIS3L2, EGFR, ENG, EPCAM, FH, FLCN, GALNT12, GATA2, GPC3, GREM1, HOXB13, HRAS, KIT, MAX, MEN1, MET, MITF, MLH1, MLH3, MSH2, MSH3, MSH6, MUTYH, NBN, NF1, NF2, NTHL1, PALB2, PDGFRA, PHOX2B, PMS2, POLD1, POLE, POT1, PRKAR1A, PTCH1, PTEN, RAD50, RAD51C, RAD51D, RB1, RECQL4, RET, RNF43, RPS20, RUNX1, SDHA, SDHAF2, SDHB, SDHC, SDHD, SMAD4, SMARCA4, SMARCB1, SMARCE1, STK11, SUFU, TERC, TERT, TMEM127, TP53, TSC1, TSC2, VHL, WRN, WT1  Results: No pathogenic variants identified.  2 variants of uncertain significance in the genes ATM c.8156G>A and RNF43 c.833A>T were identified. The date of this  test report is 03/06/2018.       FAMILY HISTORY:  We obtained a detailed, 4-generation family history.  Significant diagnoses are listed below: Family History  Problem Relation Age of Onset  . Drug abuse Mother   . Heart disease Mother   . Hypertension Mother   . Breast cancer Mother 29  . Lung cancer Father   . Aortic aneurysm Father   . Diabetes Sister   . Aortic aneurysm Sister   . Breast cancer Maternal Aunt 70  . Prostate cancer Brother        dx early 37's  . Bladder Cancer Maternal Uncle   . Skin cancer Maternal Grandfather   . Bone cancer Maternal Grandfather   . Ovarian cancer Maternal Aunt        dx 50's/60's  . Brain cancer Maternal Aunt 55  . Skin cancer Cousin     Ms. Rooks has 2 sons ages 51, 38, and 8 with no history of cancer.  No grandchildren at this time.  Ms. Seyller has a brother who is 68 and sister who is 55 with MS and recently had an aortic aneurysm.  Ms. Bresee has a maternal half-brother who is 68 and has a history of prostate cancer dx in his early 55's.   Ms. Kitamura father: died of lung cancer. Hx of smoking. He had an aortic aneurysm.  Paternal aunts/Uncles: unk Paternal cousins: unk Paternal grandfather: unk Paternal grandmother:died in childbirth  Ms. Costello's mother: 53, hx of breast cancer dx in her 16's Maternal Aunts/Uncles: 3 maternal aunts and 1 maternal uncle: -1 maternal aunt had breast cancer dx in her 71's and is now in her mid 68's.  -1  maternal aunt died of ovarian cancer in her 95's/60's -1 maternal aunt died of brain cancer in her 50's -1 maternal uncle bladder cancer Maternal cousins: 1 maternal cousin with skin cancer Maternal grandfather: died in his 21's? He had skin and bone cancer.  Maternal grandmother:unk cause of death, died in 49's?  Ms. Mosco is unaware of previous family history of genetic testing for hereditary cancer risks. Patient's maternal ancestors are of Caucasian descent, and paternal ancestors are  of Caucasian descent. There is no reported Ashkenazi Jewish ancestry. There is no known consanguinity.  GENETIC TEST RESULTS: Genetic testing performed through [facility]'s Multi-Cancer Panel reported out on 03/06/2018 showed no pathogenic mutations. The Multi-Cancer Panel offered by [facility] includes sequencing and/or deletion duplication testing of the following 91 genes: AIP, ALK, APC, ATM, AXIN2, BAP1, BARD1, BLM, BMPR1A, BRCA1, BRCA2, BRIP1, BUB1B, CASR, CDC73, CDH1, CDK4, CDKN1B, CDKN1C, CDKN2A, CEBPA, CEP57, CHEK2, CTNNA1, DICER1, DIS3L2, EGFR, ENG, EPCAM, FH, FLCN, GALNT12, GATA2, GPC3, GREM1, HOXB13, HRAS, KIT, MAX, MEN1, MET, MITF, MLH1, MLH3, MSH2, MSH3, MSH6, MUTYH, NBN, NF1, NF2, NTHL1, PALB2, PDGFRA, PHOX2B, PMS2, POLD1, POLE, POT1, PRKAR1A, PTCH1, PTEN, RAD50, RAD51C, RAD51D, RB1, RECQL4, RET, RNF43, RPS20, RUNX1, SDHA, SDHAF2, SDHB, SDHC, SDHD, SMAD4, SMARCA4, SMARCB1, SMARCE1, STK11, SUFU, TERC, TERT, TMEM127, TP53, TSC1, TSC2, VHL, WRN, WT1  A variant of uncertain significance (VUS) in a gene called ATM was also noted. C.8156G>A ([id number]) A variant of uncertain significance (VUS) in a gene called RNF43 was also noted. C.833A>T (pc.Glu278Val).  The test report will be scanned into EPIC and will be located under the Molecular Pathology section of the Results Review tab. A portion of the result report is included below for reference.     We discussed with Ms. Courter that because current genetic testing is not perfect, it is possible there may be a gene mutation in one of these genes that current testing cannot detect, but that chance is small.  We also discussed, that there could be another gene that has not yet been discovered, or that we have not yet tested, that is responsible for the cancer diagnoses in the family. It is also possible there is a hereditary cause for the cancer in the family that Ms. Mairena did not inherit and therefore was not identified in her testing.  Therefore, it  is important to remain in touch with cancer genetics in the future so that we can continue to offer Ms. Paladino the most up to date genetic testing.   Regarding the VUS's in ATM and RNF43: At this time, it is unknown if these variants are associated with increased cancer risk or if they are normal findings, but most variants such as these get reclassified to being inconsequential. They should not be used to make medical management decisions. With time, we suspect the lab will determine the significance of these variants, if any. If we do learn more about them, we will try to contact Ms. Ditton to discuss it further. However, it is important to stay in touch with [place] periodically and keep the address and phone number up to date.  ADDITIONAL GENETIC TESTING: We discussed with Ms. Captain that her genetic testing was fairly extensive.  If there are are genes identified to increase cancer risk that can be analyzed in the future, we would be happy to discuss and coordinate this testing at that time.    CANCER SCREENING RECOMMENDATIONS: This negative result means that we were unable to identify a hereditary cause for her personal and  family history of cancer at this time.  While reassuring, this result does not rule out a hereditary cause for her  cancer.  It is still possible that there could be genetic mutations that are undetectable by current technology, or genetic mutations in genes that have not been tested or identified to increase cancer risk.  Therefore, it is recommended she continue to follow the cancer management and screening guidelines provided by her oncology and primary healthcare provider. An individual's cancer risk is not determined by genetic test results alone.  Overall cancer risk assessment includes additional factors such as personal medical history, family history, etc.  These should be used to make a personalized plan for cancer prevention and surveillance.    RECOMMENDATIONS FOR FAMILY  MEMBERS:  Relatives in this family might be at some increased risk of developing cancer, over the general population risk, simply due to the family history of cancer.  We recommended women in this family have a yearly mammogram beginning at age 33, or 43 years younger than the earliest onset of cancer, an annual clinical breast exam, and perform monthly breast self-exams. Women in this family should also have a gynecological exam as recommended by their primary provider. All family members should have a colonoscopy by age 49 (or as directed by their doctors).  All family members should inform their physicians about the family history of cancer so their doctors can make the most appropriate screening recommendations for them.   It is also possible there is a hereditary cause for the cancer in Ms. Swift's family that she did not inherit and therefore was not identified in her.   Therefore, we recommended her maternal relatives also have genetic counseling and testing. Ms. Suares will let us know if we can be of any assistance in coordinating genetic counseling and/or testing for these family members.   Regarding Ms. Cephus's question about the family history of aortic aneurysm.  We suggested her sister (with an aortic aneurysm) share the family history with her doctors and ask about risks/reccommendations for the family.  Genetic testing is most informative when done in the affected individual.  We also suggested Ms. Berline Lopes see a cardiologist if she would like to discuss this risk and any management/screening recommendations for herself.   FOLLOW-UP: Lastly, we discussed with Ms. Garn that cancer genetics is a rapidly advancing field and it is possible that new genetic tests will be appropriate for her and/or her family members in the future. We encouraged her to remain in contact with cancer genetics on an annual basis so we can update her personal and family histories and let her know of advances in cancer  genetics that may benefit this family.   Our contact number was provided. Ms. Owensby questions were answered to her satisfaction, and she knows she is welcome to call us at anytime with additional questions or concerns.   Ferol Luz, MS, [facility] Certified Genetic Counselor [email or web address]

## 2018-03-06 NOTE — Telephone Encounter (Signed)
Revealed negative genetic testing.  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   This normal result is reassuring and indicates that it is unlikely Courtney Dixon's cancer is due to a hereditary cause.  It is unlikely that there is an increased risk of another cancer due to a mutation in one of these genes.  However, genetic testing is not perfect, and cannot definitively rule out a hereditary cause.  It will be important for her to keep in contact with genetics to learn if any additional testing may be needed in the future.   She does still have a significant family history of cancer that should still be taken into account when making medical management decisions.  We also discussed that we recommend maternal relatives also have genetic testing as there could be a hereditary cause for their cancer that Courtney Dixon did not inherit and therefore was not found in her.   We recommend Courtney Dixon's sister ask her cardiologists/doctors about risks/management due to the family history of aortic aneurysm.

## 2018-03-08 ENCOUNTER — Ambulatory Visit: Payer: No Typology Code available for payment source | Admitting: Neurology

## 2018-03-08 ENCOUNTER — Encounter

## 2018-03-10 ENCOUNTER — Other Ambulatory Visit: Payer: Self-pay | Admitting: Surgery

## 2018-03-14 DIAGNOSIS — J439 Emphysema, unspecified: Secondary | ICD-10-CM

## 2018-03-14 HISTORY — DX: Emphysema, unspecified: J43.9

## 2018-03-15 ENCOUNTER — Encounter: Payer: Self-pay | Admitting: Emergency Medicine

## 2018-03-15 ENCOUNTER — Emergency Department (INDEPENDENT_AMBULATORY_CARE_PROVIDER_SITE_OTHER)
Admission: EM | Admit: 2018-03-15 | Discharge: 2018-03-15 | Disposition: A | Discharge status: Home / Self Care | Payer: Medicaid Other | Source: Home / Self Care | Attending: Family Medicine | Admitting: Family Medicine

## 2018-03-15 ENCOUNTER — Other Ambulatory Visit: Payer: Self-pay

## 2018-03-15 DIAGNOSIS — E1165 Type 2 diabetes mellitus with hyperglycemia: Secondary | ICD-10-CM

## 2018-03-15 DIAGNOSIS — IMO0002 Reserved for concepts with insufficient information to code with codable children: Secondary | ICD-10-CM

## 2018-03-15 DIAGNOSIS — E1142 Type 2 diabetes mellitus with diabetic polyneuropathy: Secondary | ICD-10-CM | POA: Diagnosis not present

## 2018-03-15 DIAGNOSIS — G44209 Tension-type headache, unspecified, not intractable: Secondary | ICD-10-CM

## 2018-03-15 DIAGNOSIS — F411 Generalized anxiety disorder: Secondary | ICD-10-CM

## 2018-03-15 LAB — POCT FASTING CBG KUC MANUAL ENTRY: POCT GLUCOSE (MANUAL ENTRY) KUC: 64 mg/dL — AB (ref 70–99)

## 2018-03-15 MED ORDER — KETOROLAC TROMETHAMINE 10 MG PO TABS: ORAL_TABLET | ORAL | 0 refills | Status: AC

## 2018-03-15 MED ORDER — LORAZEPAM 0.5 MG PO TABS: ORAL_TABLET | ORAL | 0 refills | Status: AC

## 2018-03-15 MED ORDER — METOCLOPRAMIDE HCL 5 MG/ML IJ SOLN
5.0000 mg | Freq: Once | INTRAMUSCULAR | Status: AC
  Administered 2018-03-15: 5 mg via INTRAMUSCULAR

## 2018-03-15 MED ORDER — DEXAMETHASONE SODIUM PHOSPHATE 10 MG/ML IJ SOLN
10.0000 mg | Freq: Once | INTRAMUSCULAR | Status: AC
  Administered 2018-03-15: 10 mg via INTRAMUSCULAR

## 2018-03-15 MED ORDER — KETOROLAC TROMETHAMINE 60 MG/2ML IM SOLN
60.0000 mg | Freq: Once | INTRAMUSCULAR | Status: AC
  Administered 2018-03-15: 60 mg via INTRAMUSCULAR

## 2018-03-15 MED FILL — ATORVASTATIN 40 MG TABLET: 40 | 30 days supply | Qty: 30 | Fill #0 | Status: TO

## 2018-03-15 MED FILL — OMEPRAZOLE 20 MG CAP: 20 | 30 days supply | Qty: 30 | Fill #0 | Status: TO

## 2018-03-15 MED FILL — LOSARTAN-HCTZ 100-25 MG TAB: 100-25 | 30 days supply | Qty: 30 | Fill #4 | Status: TO

## 2018-03-15 MED FILL — INVOKANA 100 MG TABLET: 100 | 30 days supply | Qty: 30 | Fill #5

## 2018-03-15 NOTE — ED Provider Notes (Signed)
[facility]    CSN: [id number] Arrival date & time: 03/15/18  1715     History   Chief Complaint Chief Complaint  Patient presents with  . Headache    HPI Courtney Dixon is a 52 y.o. female.   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  About one week ago she developed a mild intermittent posterior headache.  The headache has now become constant and more intense, involving her entire head.  Today she has had light sensitivity and nausea (without vomiting).  She has a family history of migraine headache (brother She requests medication to help with her stress.  The history is provided by the patient.  Headache  Pain location:  Generalized Quality:  Dull Radiates to:  Does not radiate Severity at highest:  8/10 Onset quality:  Gradual Duration:  1 week Timing:  Constant Progression:  Worsening Chronicity:  New Similar to prior headaches: yes   Context: emotional stress   Relieved by:  Nothing Worsened by:  Light and activity Ineffective treatments:  None tried Associated symptoms: fatigue, nausea, photophobia and seizures   Associated symptoms: no blurred vision, no congestion, no cough, no diarrhea, no eye pain, no facial pain, no fever, no focal weakness, no hearing loss, no loss of balance, no myalgias, no near-syncope, no neck pain, no neck stiffness, no numbness, no paresthesias, no sinus pressure, no sore throat, no swollen glands, no syncope, no tingling, no URI, no visual change, no vomiting and no weakness     Past Medical History:  Diagnosis Date  . Anxiety   . Depression   . Diabetes mellitus, type II ([place])   . Dyspnea   . Family history of bladder cancer   . Family history of bone cancer   . Family history of brain cancer   . Family history of breast cancer   . Family history of lung cancer   . Family history of ovarian cancer   . Family history of prostate cancer   . Family history of skin cancer     . Hypertension   . Migraines   . Neuropathy   . PTSD (post-traumatic stress disorder)     Patient Active Problem List   Diagnosis Date Noted  . Genetic testing 03/06/2018  . Family history of breast cancer   . Family history of ovarian cancer   . Family history of bladder cancer   . Family history of prostate cancer   . Family history of brain cancer   . Family history of skin cancer   . Family history of bone cancer   . Family history of lung cancer   . Malignant neoplasm of lower-inner quadrant of right breast of female, estrogen receptor positive ([place]) 02/08/2018  . Bipolar disorder ([place]) 07/04/2017  . Chronic pain 07/22/2016  . Nerve pain 07/22/2016  . Pain of left heel 05/14/2016  . Left wrist pain 05/14/2016  . Obesity (BMI 30-39.9) 10/21/2015  . Uncontrolled type 2 diabetes mellitus with diabetic polyneuropathy, without long-term current use of insulin ([place]) 10/21/2015  . Chronic low back pain 10/21/2015  . Sensation of feeling hot 10/21/2015  . Hyperlipidemia 10/21/2015  . Smoking 10/15/2013  . HTN (hypertension) 10/15/2013  . Anxiety and depression 10/15/2013    Past Surgical History:  Procedure Laterality Date  . BREAST LUMPECTOMY WITH RADIOACTIVE SEED LOCALIZATION Right 01/11/2018   Procedure: BREAST LUMPECTOMY WITH RADIOACTIVE SEED LOCALIZATION;  Surgeon: Coralie Keens, MD;  Location: [facility];  Service: General;  Laterality: Right;  . CESAREAN SECTION    . TUBAL LIGATION      OB History    Gravida  3   Para  3   Term  3   Preterm      AB      Living  3     SAB      TAB      Ectopic      Multiple      Live Births  3            Home Medications    Prior to Admission medications   Medication Sig Start Date End Date Taking? Authorizing Provider  atorvastatin (LIPITOR) 40 MG tablet Take 1 tablet (40 mg total) by mouth daily. 07/04/17   Charlott Rakes, MD  Blood Glucose Monitoring Suppl (TRUE METRIX METER)  w/Device KIT 1 each by Does not apply route as needed. 03/18/16   Boykin Nearing, MD  canagliflozin (INVOKANA) 100 MG TABS tablet Take 1 tablet (100 mg total) by mouth daily before breakfast. 07/04/17   Charlott Rakes, MD  escitalopram (LEXAPRO) 10 MG tablet Take 1 tablet (10 mg total) by mouth daily. 12/28/17   Clent Demark, PA-C  glimepiride (AMARYL) 4 MG tablet Take 2 tablets (8 mg total) by mouth daily with breakfast. 07/04/17   Charlott Rakes, MD  glucose blood (TRUE METRIX BLOOD GLUCOSE TEST) test strip 1 each by Other route 3 (three) times daily. 06/15/17   Charlott Rakes, MD  hydrOXYzine (ATARAX/VISTARIL) 25 MG tablet TAKE 1 TABLET BY MOUTH 2 TIMES DAILY. 02/27/18   Clent Demark, PA-C  ibuprofen (ADVIL,MOTRIN) 200 MG tablet Take 600 mg by mouth 2 (two) times daily.    [provider]  ketorolac (TORADOL) 10 MG tablet Take one tab by mouth 2 or 3 times daily as needed for headache. 03/15/18   Kandra Nicolas, MD  lamoTRIgine (LAMICTAL) 200 MG tablet Take 1 tablet (200 mg total) daily by mouth. 04/26/17   Eksir, Richard Miu, MD  LORazepam (ATIVAN) 0.5 MG tablet Take one tab PO BID to TID prn for anxiety 03/15/18   Kandra Nicolas, MD  losartan-hydrochlorothiazide (HYZAAR) 100-25 MG tablet Take 1 tablet by mouth daily. 07/04/17   Charlott Rakes, MD  metFORMIN (GLUCOPHAGE-XR) 500 MG 24 hr tablet Take 2 tablets (1,000 mg total) by mouth 2 (two) times daily. 07/04/17   Charlott Rakes, MD  omeprazole (PRILOSEC) 20 MG capsule TAKE 1 CAPSULE BY MOUTH DAILY. 02/07/18   Clent Demark, PA-C  pregabalin (LYRICA) 100 MG capsule Take 1 capsule (100 mg total) by mouth 3 (three) times daily. For PASS dose change 12/28/17   Clent Demark, PA-C  traZODone (DESYREL) 50 MG tablet Take 1 tablet (50 mg total) by mouth at bedtime. 02/21/18   Magrinat, Virgie Dad, MD  TRUEPLUS LANCETS 28G MISC 1 each by Does not apply route 3 (three) times daily. 03/18/16   Funches, Adriana Mccallum, MD  VENTOLIN HFA  108 (90 Base) MCG/ACT inhaler INHALE 2 PUFFS INTO THE LUNGS EVERY 6 (SIX) HOURS AS NEEDED FOR WHEEZING OR SHORTNESS OF BREATH. 12/08/17   Charlott Rakes, MD    Family History Family History  Problem Relation Age of Onset  . Drug abuse Mother   . Heart disease Mother   . Hypertension Mother   . Breast cancer Mother 20  . Lung cancer Father   . Aortic aneurysm Father   . Diabetes Sister   .  Aortic aneurysm Sister   . Breast cancer Maternal Aunt 70  . Prostate cancer Brother        dx early 49's  . Bladder Cancer Maternal Uncle   . Skin cancer Maternal Grandfather   . Bone cancer Maternal Grandfather   . Ovarian cancer Maternal Aunt        dx 50's/60's  . Brain cancer Maternal Aunt 55  . Skin cancer Cousin     Social History Social History   Tobacco Use  . Smoking status: Current Every Day Smoker    Packs/day: 1.00    Years: 36.00    Pack years: 36.00    Types: Cigarettes  . Smokeless tobacco: Never Used  Substance Use Topics  . Alcohol use: No    Comment: Rarely uses   . Drug use: No     Allergies   Codeine and Sulfa antibiotics   Review of Systems Review of Systems  Constitutional: Positive for fatigue. Negative for fever.  HENT: Negative for congestion, hearing loss, sinus pressure and sore throat.   Eyes: Positive for photophobia. Negative for blurred vision and pain.  Respiratory: Negative for cough.   Cardiovascular: Negative for syncope and near-syncope.  Gastrointestinal: Positive for nausea. Negative for diarrhea and vomiting.  Musculoskeletal: Negative for myalgias, neck pain and neck stiffness.  Neurological: Positive for seizures and headaches. Negative for focal weakness, weakness, numbness, paresthesias and loss of balance.     Physical Exam Triage Vital Signs ED Triage Vitals  Enc Vitals Group     BP 03/15/18 1735 117/80     Pulse Rate 03/15/18 1735 80     Resp --      Temp 03/15/18 1735 98.7 F (37.1 C)     Temp Source 03/15/18 1735  Oral     SpO2 03/15/18 1735 95 %     Weight 03/15/18 1736 224 lb (101.6 kg)     Height 03/15/18 1736 5' 4"  (1.626 m)     Head Circumference --      Peak Flow --      Pain Score 03/15/18 1736 8     Pain Loc --      Pain Edu? --      Excl. in [place]? --    No data found.  Updated Vital Signs BP 117/80 (BP Location: Right Arm)   Pulse 80   Temp 98.7 F (37.1 C) (Oral)   Ht 5' 4"  (1.626 m)   Wt 101.6 kg   SpO2 95%   BMI 38.45 kg/m   Visual Acuity Right Eye Distance:   Left Eye Distance:   Bilateral Distance:    Right Eye Near:   Left Eye Near:    Bilateral Near:     Physical Exam Nursing notes and Vital Signs reviewed. Appearance:  Patient appears stated age, and in no acute distress. Psychiatric:  Patient is alert and oriented with good eye contact.  Thoughts are organized.  No psychomotor retardation.  Memory intact.  Not suicidal  Eyes:  Pupils are equal, round, and reactive to light and accomodation.  Extraocular movement is intact.  Conjunctivae are not inflamed.  Fundi benign.  Ears:  Canals normal.  Tympanic membranes normal.  Nose:   Normal turbinates.  No sinus tenderness.   Pharynx:  Normal Neck:  Supple.  No adenopathy.  Lungs:  Clear to auscultation.  Breath sounds are equal.  Moving air well. Heart:  Regular rate and rhythm without murmurs, rubs, or gallops.  Abdomen:  Nontender  without masses or hepatosplenomegaly.  Bowel sounds are present.  No CVA or flank tenderness.  Extremities:  No edema.  Skin:  No rash present.   Neurologic:  Cranial nerves 2 through 12 are normal.  Patellar, achilles, and elbow reflexes are normal.  Cerebellar function is intact (finger-to-nose and rapid alternating hand movement).  Gait and station are normal.    UC Treatments / Results  Labs (all labs ordered are listed, but only abnormal results are displayed) Labs Reviewed  POCT FASTING CBG [facility] MANUAL ENTRY - Abnormal; Notable for the following components:      Result Value    POCT Glucose ([facility]) 64 (*)    All other components within normal limits    EKG None  Radiology No results found.  Procedures Procedures (including critical care time)  Medications Ordered in UC Medications  ketorolac (TORADOL) injection 60 mg (60 mg Intramuscular Given 03/15/18 1819)  metoCLOPramide (REGLAN) injection 5 mg (5 mg Intramuscular Given 03/15/18 1819)  dexamethasone (DECADRON) injection 10 mg (10 mg Intramuscular Given 03/15/18 1819)    Initial Impression / Assessment and Plan / UC Course  I have reviewed the triage vital signs and the nursing notes.  Pertinent labs & imaging results that were available during my care of the patient were reviewed by me and considered in my medical decision making (see chart for details).    Administered Toradol 77m IM Administered Decadron 141mIM. Administered Reglan 43m58mM. Rx written for lorazepam 0.43mg46m15, no refill). Controlled Substance Prescriptions I have consulted the [place] Controlled Substances Registry for this patient, and feel the risk/benefit ratio today is favorable for proceeding with this prescription for a controlled substance.   At patient's request, Rx for Toradol 10mg42me BID to TID prn headache (#10, no refill);  Advised to discontinue if abdominal pain occurs. Followup with Family Doctor for management.   Final Clinical Impressions(s) / UC Diagnoses   Final diagnoses:  Uncontrolled type 2 diabetes mellitus with diabetic polyneuropathy, without long-term current use of insulin (HCC)  Acute non intractable tension-type headache  Anxiety state     Discharge Instructions     If symptoms become significantly worse during the night or over the weekend, proceed to the local emergency room.     ED Prescriptions    Medication Sig Dispense Auth. Provider   LORazepam (ATIVAN) 0.5 MG tablet Take one tab PO BID to TID prn for anxiety 15 tablet BeeseKandra Nicolas  ketorolac (TORADOL) 10 MG tablet Take one tab  by mouth 2 or 3 times daily as needed for headache. 10 tablet BeeseKandra Nicolas       BeeseKandra Nicolas10/05/19 [phone]

## 2018-03-15 NOTE — Discharge Instructions (Addendum)
If symptoms become significantly worse during the night or over the weekend, proceed to the local emergency room.  

## 2018-03-15 NOTE — ED Triage Notes (Signed)
Headache on and off x 1 week worse today

## 2018-03-16 ENCOUNTER — Other Ambulatory Visit (INDEPENDENT_AMBULATORY_CARE_PROVIDER_SITE_OTHER): Payer: Self-pay | Admitting: Physician Assistant

## 2018-03-16 DIAGNOSIS — F4312 Post-traumatic stress disorder, chronic: Secondary | ICD-10-CM

## 2018-03-16 DIAGNOSIS — F17200 Nicotine dependence, unspecified, uncomplicated: Secondary | ICD-10-CM

## 2018-03-16 MED ORDER — LAMOTRIGINE 200 MG PO TABS: 200.0000 mg | ORAL_TABLET | Freq: Every day | ORAL | 0 refills | Status: AC

## 2018-03-16 MED FILL — lamoTRIgine 200 MG TABS: 200 | 30 days supply | Qty: 30 | Fill #0

## 2018-03-17 ENCOUNTER — Telehealth: Payer: Self-pay

## 2018-03-17 ENCOUNTER — Other Ambulatory Visit: Payer: Self-pay

## 2018-03-17 DIAGNOSIS — IMO0002 Reserved for concepts with insufficient information to code with codable children: Secondary | ICD-10-CM

## 2018-03-17 DIAGNOSIS — E1165 Type 2 diabetes mellitus with hyperglycemia: Principal | ICD-10-CM

## 2018-03-17 DIAGNOSIS — E1142 Type 2 diabetes mellitus with diabetic polyneuropathy: Secondary | ICD-10-CM

## 2018-03-17 MED ORDER — ACCU-CHEK AVIVA PLUS W/DEVICE KIT: PACK | 0 refills | Status: AC

## 2018-03-17 MED ORDER — ACCU-CHEK SOFTCLIX LANCETS MISC: 12 refills | Status: AC

## 2018-03-17 MED ORDER — GLUCOSE BLOOD VI STRP: ORAL_STRIP | 12 refills | Status: AC

## 2018-03-17 MED FILL — ACCU-CHEK AVIVA PLUS METER: W/DEVICE | 1 days supply | Qty: 1 | Fill #0

## 2018-03-17 MED FILL — ACCU-CHEK AVIVA PLUS TEST S: 30 days supply | Qty: 100 | Fill #0 | Status: TO

## 2018-03-17 MED FILL — ACCU-CHEK SOFTCLIX LANCETS: 30 days supply | Qty: 100 | Fill #0 | Status: TO

## 2018-03-17 NOTE — Telephone Encounter (Signed)
Courtney Dixon now has medicaid and the invokana is not a preferred drug for medicaid, they prefer Jardiance. Please send new RX if this is an acceptable alternative for her.

## 2018-03-20 ENCOUNTER — Other Ambulatory Visit (INDEPENDENT_AMBULATORY_CARE_PROVIDER_SITE_OTHER): Payer: Self-pay | Admitting: Physician Assistant

## 2018-03-20 MED ORDER — EMPAGLIFLOZIN 25 MG PO TABS: 25.0000 mg | ORAL_TABLET | Freq: Every day | ORAL | 6 refills | Status: AC

## 2018-03-20 NOTE — Telephone Encounter (Signed)
Thank you. Jardiance sent to [facility].

## 2018-03-21 ENCOUNTER — Encounter (HOSPITAL_COMMUNITY): Payer: Self-pay | Admitting: *Deleted

## 2018-03-30 NOTE — Pre-Procedure Instructions (Signed)
Courtney Dixon  03/30/2018      [facility] [address], [place] [address] Phone: [phone] Fax: [phone]  [facility] [address] [facility] [place], [place] - [place] [address] Phone: [phone] Fax: [phone]  [facility] - [place], [place] [address] Phone: [phone] Fax: [phone]    Your procedure is scheduled on Friday October 25.  Report to [facility] Admitting at 5:30 A.M.  Call this number if you have problems the morning of surgery:  [phone]   Remember:  Do not eat or drink after midnight.     Take these medicines the morning of surgery with A SIP OF WATER acetaminophen (TYLENOL) if needed escitalopram (LEXAPRO)  lamoTRIgine (LAMICTAL) LORazepam (ATIVAN)  omeprazole (PRILOSEC)  pregabalin (LYRICA) venlafaxine XR (EFFEXOR-XR)  Ventolin inhaler if needed (bring inhaler with you to hospital)  7 days prior to surgery STOP taking any Aspirin(unless otherwise instructed by your surgeon), Aleve, Naproxen, Ibuprofen, Motrin, Advil, Goody's, BC's, all herbal medications, fish oil, and all vitamins   HOW TO MANAGE YOUR DIABETES BEFORE AND AFTER SURGERY  Why is it important to control my blood sugar before and after surgery? . Improving blood sugar levels before and after surgery helps healing and can limit problems. . A way of improving blood sugar control is eating a healthy diet by: o  Eating less sugar and carbohydrates o  Increasing activity/exercise o  Talking with your doctor about reaching your blood sugar goals . High blood sugars (greater than 180 mg/dL) can raise your risk of infections and slow your recovery, so you will need to focus on controlling your diabetes during the weeks before surgery. . Make sure that the doctor  who takes care of your diabetes knows about your planned surgery including the date and location.  How do I manage my blood sugar before surgery? . Check your blood sugar at least 4 times a day, starting 2 days before surgery, to make sure that the level is not too high or low. o Check your blood sugar the morning of your surgery when you wake up and every 2 hours until you get to the Short Stay unit. . If your blood sugar is less than 70 mg/dL, you will need to treat for low blood sugar: o Do not take insulin. o Treat a low blood sugar (less than 70 mg/dL) with  cup of clear juice (cranberry or apple), 4 glucose tablets, OR glucose gel. Recheck blood sugar in 15 minutes after treatment (to make sure it is greater than 70 mg/dL). If your blood sugar is not greater than 70 mg/dL on recheck, call [phone] o  for further instructions. . Report your blood sugar to the short stay nurse when you get to Short Stay.  . If you are admitted to the hospital after surgery: o Your blood sugar will be checked by the staff and you will probably be given insulin after surgery (instead of oral diabetes medicines) to make sure you have good blood sugar levels. o The goal for blood sugar control after surgery is 80-180 mg/dL.     WHAT DO I DO ABOUT MY DIABETES MEDICATION?   Marland Kitchen Do not take oral diabetes medicines (pills): Invokana, Jardiance, glimepiride or metformin the morning of surgery.     Do not wear jewelry,  make-up or nail polish.  Do not wear lotions, powders, or perfumes, or deodorant.  Do not shave 48 hours prior to surgery.  Men may shave face and neck.  Do not bring valuables to the hospital.  [facility] is not responsible for any belongings or valuables.  Contacts, dentures or bridgework may not be worn into surgery.  Leave your suitcase in the car.  After surgery it may be brought to your room.  For patients admitted to the hospital, discharge time will be determined by your treatment  team.  Patients discharged the day of surgery will not be allowed to drive home.    Please read over the following fact sheets that you were given.

## 2018-03-31 ENCOUNTER — Inpatient Hospital Stay (HOSPITAL_COMMUNITY)
Admission: RE | Admit: 2018-03-31 | Discharge: 2018-03-31 | Disposition: A | Discharge status: Home / Self Care | Payer: No Typology Code available for payment source | Source: Ambulatory Visit

## 2018-04-12 NOTE — Pre-Procedure Instructions (Signed)
Courtney Dixon  04/12/2018      [facility] [place], [place] - [place] [address] Phone: [phone] Fax: [phone]  [facility] [address] - [place], [place] - [place] [address] Phone: [phone] Fax: [phone]  [facility] [address] - [place], [place] [address] Phone: [phone] Fax: [phone]    Your procedure is scheduled on Tues. Nov. 5, 2019 from 9:30AM-10:30AM  Report to [facility] Admitting Entrance "A" at 7:30AM  Call this number if you have problems the morning of surgery:  [phone]   Remember:  Do not eat  after midnight on Nov.4th  You may drink clear liquids until 3 hours (6:30AM) prior to surgery.  Clear liquids allowed are:  Water and Gatorade    Take these medicines the morning of surgery with A SIP OF WATER: LamoTRIgine (LAMICTAL), Omeprazole (PRILOSEC), Pregabalin (LYRICA), and Venlafaxine XR (EFFEXOR-XR)  If needed: Acetaminophen (TYLENOL) and VENTOLIN HFA-bring with you the day of surgery  Do not take Canagliflozin (INVOKANA), Glimepiride (AMARYL), and MetFORMIN (GLUCOPHAGE-XR) the morning of surgery  As of today, stop taking all Other Aspirin Products, Vitamins, Fish oils, and Herbal medications. Also stop all NSAIDS i.e. Advil, Ibuprofen, Motrin, Aleve, Anaprox, Naproxen, BC, Goody Powders, and all Supplements.  How to Manage Your Diabetes Before and After Surgery  Why is it important to control my blood sugar before and after surgery? . Improving blood sugar levels before and after surgery helps healing and can limit problems. . A way of improving blood sugar control is eating a healthy diet by: o  Eating less sugar and carbohydrates o  Increasing activity/exercise o  Talking with your doctor about reaching your blood sugar  goals . High blood sugars (greater than 180 mg/dL) can raise your risk of infections and slow your recovery, so you will need to focus on controlling your diabetes during the weeks before surgery. . Make sure that the doctor who takes care of your diabetes knows about your planned surgery including the date and location.  How do I manage my blood sugar before surgery? . Check your blood sugar at least 4 times a day, starting 2 days before surgery, to make sure that the level is not too high or low. o Check your blood sugar the morning of your surgery when you wake up and every 2 hours until you get to the Short Stay unit. . If your blood sugar is less than 70 mg/dL, you will need to treat for low blood sugar: o Do not take insulin. o Treat a low blood sugar (less than 70 mg/dL) with  cup of clear juice (cranberry or apple), 4 glucose tablets, OR glucose gel. Recheck blood sugar in 15 minutes after treatment (to make sure it is greater than 70 mg/dL). If your blood sugar is not greater than 70 mg/dL on recheck, call [phone] o  for further instructions. .  If your CBG is greater than 220 mg/dL, call the number above for further instructions  . If you are admitted to the hospital after surgery: o Your blood sugar will be checked by the staff and you will probably be given insulin after surgery (instead of oral diabetes medicines) to make sure you have good blood sugar levels. o The goal for blood sugar control after surgery is 80-180 mg/dL.  Do not wear jewelry, make-up or nail polish.  Do not wear lotions, powders, or perfumes, or deodorant.  Do not shave 48 hours prior to surgery.    Do not bring valuables to the hospital.  [facility] is not responsible for any belongings or valuables.  Contacts, dentures or bridgework may not be worn into surgery.  Leave your suitcase in the car.  After surgery it may be brought to your room.  For patients admitted to the hospital, discharge time  will be determined by your treatment team.  Patients discharged the day of surgery will not be allowed to drive home.   Special instructions:  [place]- Preparing For Surgery  Before surgery, you can play an important role. Because skin is not sterile, your skin needs to be as free of germs as possible. You can reduce the number of germs on your skin by washing with CHG (chlorahexidine gluconate) Soap before surgery.  CHG is an antiseptic cleaner which kills germs and bonds with the skin to continue killing germs even after washing.    Oral Hygiene is also important to reduce your risk of infection.  Remember - BRUSH YOUR TEETH THE MORNING OF SURGERY WITH YOUR REGULAR TOOTHPASTE  Please do not use if you have an allergy to CHG or antibacterial soaps. If your skin becomes reddened/irritated stop using the CHG.  Do not shave (including legs and underarms) for at least 48 hours prior to first CHG shower. It is OK to shave your face.  Please follow these instructions carefully.   1. Shower the NIGHT BEFORE SURGERY and the MORNING OF SURGERY with CHG.   2. If you chose to wash your hair, wash your hair first as usual with your normal shampoo.  3. After you shampoo, rinse your hair and body thoroughly to remove the shampoo.  4. Use CHG as you would any other liquid soap. You can apply CHG directly to the skin and wash gently with a scrungie or a clean washcloth.   5. Apply the CHG Soap to your body ONLY FROM THE NECK DOWN.  Do not use on open wounds or open sores. Avoid contact with your eyes, ears, mouth and genitals (private parts). Wash Face and genitals (private parts)  with your normal soap.  6. Wash thoroughly, paying special attention to the area where your surgery will be performed.  7. Thoroughly rinse your body with warm water from the neck down.  8. DO NOT shower/wash with your normal soap after using and rinsing off the CHG Soap.  9. Pat yourself dry with a CLEAN  TOWEL.  10. Wear CLEAN PAJAMAS to bed the night before surgery, wear comfortable clothes the morning of surgery  11. Place CLEAN SHEETS on your bed the night of your first shower and DO NOT SLEEP WITH PETS.  Day of Surgery:  Do not apply any deodorants/lotions.  Please wear clean clothes to the hospital/surgery center.   Remember to brush your teeth WITH YOUR REGULAR TOOTHPASTE.  Please read over the following fact sheets that you were given. Pain Booklet, Coughing and Deep Breathing and Surgical Site Infection Prevention

## 2018-04-13 ENCOUNTER — Other Ambulatory Visit: Payer: Self-pay

## 2018-04-13 ENCOUNTER — Encounter (HOSPITAL_COMMUNITY): Payer: Self-pay

## 2018-04-13 ENCOUNTER — Encounter (HOSPITAL_COMMUNITY)
Admission: RE | Admit: 2018-04-13 | Discharge: 2018-04-13 | Disposition: A | Discharge status: Home / Self Care | Payer: Medicaid Other | Source: Ambulatory Visit | Attending: Surgery | Admitting: Surgery

## 2018-04-13 DIAGNOSIS — Z01812 Encounter for preprocedural laboratory examination: Secondary | ICD-10-CM | POA: Insufficient documentation

## 2018-04-13 HISTORY — DX: Calculus of gallbladder without cholecystitis without obstruction: K80.20

## 2018-04-13 HISTORY — DX: Emphysema, unspecified: J43.9

## 2018-04-13 HISTORY — DX: Pneumonia, unspecified organism: J18.9

## 2018-04-13 HISTORY — DX: Myoneural disorder, unspecified: G70.9

## 2018-04-13 LAB — CBC
HCT: 46.5 % — ABNORMAL HIGH (ref 36.0–46.0)
HEMOGLOBIN: 14.6 g/dL (ref 12.0–15.0)
MCH: 27 pg (ref 26.0–34.0)
MCHC: 31.4 g/dL (ref 30.0–36.0)
MCV: 86 fL (ref 80.0–100.0)
Platelets: 313 10*3/uL (ref 150–400)
RBC: 5.41 MIL/uL — AB (ref 3.87–5.11)
RDW: 13.8 % (ref 11.5–15.5)
WBC: 10.1 10*3/uL (ref 4.0–10.5)
nRBC: 0 % (ref 0.0–0.2)

## 2018-04-13 LAB — BASIC METABOLIC PANEL
Anion gap: 8 (ref 5–15)
BUN: 11 mg/dL (ref 6–20)
CHLORIDE: 100 mmol/L (ref 98–111)
CO2: 30 mmol/L (ref 22–32)
Calcium: 9.7 mg/dL (ref 8.9–10.3)
Creatinine, Ser: 0.67 mg/dL (ref 0.44–1.00)
GFR calc Af Amer: >60 mL/min (ref >60)
GLUCOSE: 143 mg/dL — AB (ref 70–99)
POTASSIUM: 3.7 mmol/L (ref 3.5–5.1)
SODIUM: 138 mmol/L (ref 135–145)

## 2018-04-13 LAB — HEMOGLOBIN A1C
Hgb A1c MFr Bld: 6.9 % — ABNORMAL HIGH (ref 4.8–5.6)
MEAN PLASMA GLUCOSE: 151.33 mg/dL

## 2018-04-13 LAB — GLUCOSE, CAPILLARY: GLUCOSE-CAPILLARY: 141 mg/dL — AB (ref 70–99)

## 2018-04-13 NOTE — Progress Notes (Addendum)
PCP - Dr. Marry Guan Cardiologist - denies  Chest x-ray -N/A EKG - 01/06/18 Stress Test - denies ECHO - patient states she had echo in [place] somewhere in [place] Cardiac Cath - denies  Sleep Study - patient denies having had sleep study. Stop bang positive. Note sent to PCP.  Fasting Blood Sugar - 100-150 Checks Blood Sugar 2-3 times a day   Aspirin Instructions: Patient instructed to stop all ASA/Ibuprogfen (NSAIDS) 7 days prior to surgery.   Anesthesia review: no Patient denies shortness of breath, fever, cough and chest pain at PAT appointment   Patient verbalized understanding of instructions that were given to them at the PAT appointment. Patient was also instructed that they will need to review over the PAT instructions again at home before surgery.

## 2018-04-13 NOTE — Progress Notes (Signed)
   04/13/18 1007  OBSTRUCTIVE SLEEP APNEA  Have you ever been diagnosed with sleep apnea through a sleep study? No  Do you snore loudly (loud enough to be heard through closed doors)?  1  Do you often feel tired, fatigued, or sleepy during the daytime (such as falling asleep during driving or talking to someone)? 0  Has anyone observed you stop breathing during your sleep? 1  Do you have, or are you being treated for high blood pressure? 1  BMI more than 35 kg/m2? 1  Age > 50 (1-yes) 1  Neck circumference greater than:Female 16 inches or larger, Female 17inches or larger? 1  Female Gender (Yes=1) 0  Obstructive Sleep Apnea Score 6  Score 5 or greater  Results sent to PCP

## 2018-04-17 NOTE — H&P (Signed)
  Courtney Dixon  Location: [facility] Patient #: [id number] DOB: 1965-09-10 Divorced / Language: [name] / Race: White Female   History of Present Illness  The patient is a 52 year old female presenting for a post-operative visit. She is here for a postoperative visit regarding her radioactive seed guided right breast lumpectomy. 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 She has seen medical oncology and genetic testing. She does not want to have reexcision of margin and radiation. She does not want breast conservation. Her genetics were negative. She reports is wanting to have a mastectomy. She is otherwise been doing well.   Allergies  Sulfa Drugs  Allergies Reconciled   Medication History  Ventolin HFA (108 (90 Base)MCG/ACT Aerosol Soln, Inhalation) Active. Atorvastatin Calcium (40MG  Tablet, Oral) Active. Glimepiride (4MG  Tablet, Oral) Active. TraZODone HCl (100MG  Tablet, Oral) Active. MetFORMIN HCl ER (500MG  Tablet ER 24HR, Oral) Active. GuanFACINE HCl (1MG  Tablet, Oral) Active. HydrOXYzine HCl (10MG  Tablet, Oral) Active. Invokana (100MG  Tablet, Oral) Active. LamoTRIgine (200MG  Tablet, Oral) Active. Losartan Potassium-HCTZ (100-25MG  Tablet, Oral) Active. Omeprazole (20MG  Capsule DR, Oral) Active. Medications Reconciled  Vitals  Weight: 224.8 lb Height: 64in Body Surface Area: 2.06 m Body Mass Index: 38.59 kg/m  Temp.: 97.33F  Pulse: 87 (Regular)  BP: 138/86 (Sitting, Left Arm, Standard    Physical Exam  The physical exam findings are as follows: Note:On exam, her incision is healing well without evidence of infection. Generally well in appearance Lungs clear CV RRR Abdomen soft, NT Skin without rashes    Assessment and Plan  Right breast ductal carcinoma in situ  Impression: Again, this is a patient with ductal  carcinoma in situ of the right breast. The medial margin was positive. She again does not want conservative management with reexcision to get negative margins and radiation therapy. She will also just have a mastectomy. I discussed this with her in detail. Again, she knows that this does not offer any survival advantage. I discussed the surgical procedure in detail. We discussed referral to a plastic surgeon with immediate reconstruction but she does not want to do this. She does most proceed with a mastectomy. I discussed the risks of surgery which includes but is not limited to bleeding, infection, flap necrosis, the need for drains, cardio pulmonary issues, DVT, etc. She understands and wished to proceed with surgery which will be scheduled    Signed by Harl Bowie,

## 2018-04-18 ENCOUNTER — Encounter (HOSPITAL_COMMUNITY)
Admission: RE | Disposition: A | Discharge status: Home / Self Care | Payer: Self-pay | Source: Ambulatory Visit | Attending: Surgery

## 2018-04-18 ENCOUNTER — Ambulatory Visit (HOSPITAL_COMMUNITY)
Admission: RE | Admit: 2018-04-18 | Discharge: 2018-04-18 | Disposition: A | Discharge status: Home / Self Care | Payer: Medicaid Other | Source: Ambulatory Visit | Attending: Surgery | Admitting: Surgery

## 2018-04-18 ENCOUNTER — Encounter (HOSPITAL_COMMUNITY): Payer: Self-pay | Admitting: *Deleted

## 2018-04-18 ENCOUNTER — Ambulatory Visit (HOSPITAL_COMMUNITY): Payer: Medicaid Other | Admitting: Anesthesiology

## 2018-04-18 DIAGNOSIS — Z5309 Procedure and treatment not carried out because of other contraindication: Secondary | ICD-10-CM | POA: Diagnosis not present

## 2018-04-18 DIAGNOSIS — D0511 Intraductal carcinoma in situ of right breast: Secondary | ICD-10-CM | POA: Insufficient documentation

## 2018-04-18 DIAGNOSIS — Z882 Allergy status to sulfonamides status: Secondary | ICD-10-CM | POA: Diagnosis not present

## 2018-04-18 DIAGNOSIS — Z79899 Other long term (current) drug therapy: Secondary | ICD-10-CM | POA: Insufficient documentation

## 2018-04-18 DIAGNOSIS — Z5301 Procedure and treatment not carried out due to patient smoking: Secondary | ICD-10-CM | POA: Diagnosis not present

## 2018-04-18 LAB — GLUCOSE, CAPILLARY: Glucose-Capillary: 155 mg/dL — ABNORMAL HIGH (ref 70–99)

## 2018-04-18 SURGERY — MASTECTOMY, SIMPLE
Anesthesia: General | Site: Breast | Laterality: Right

## 2018-04-18 MED ORDER — CEFAZOLIN SODIUM-DEXTROSE 2-4 GM/100ML-% IV SOLN
INTRAVENOUS | Status: AC
  Filled 2018-04-18: qty 100

## 2018-04-18 MED ORDER — ACETAMINOPHEN 500 MG PO TABS
ORAL_TABLET | ORAL | Status: AC
  Administered 2018-04-18: 1000 mg via ORAL
  Filled 2018-04-18: qty 2

## 2018-04-18 MED ORDER — PROPOFOL 10 MG/ML IV BOLUS
INTRAVENOUS | Status: AC
  Filled 2018-04-18: qty 20

## 2018-04-18 MED ORDER — CHLORHEXIDINE GLUCONATE CLOTH 2 % EX PADS: 6.0000 | MEDICATED_PAD | Freq: Once | CUTANEOUS | Status: DC

## 2018-04-18 MED ORDER — FENTANYL CITRATE (PF) 250 MCG/5ML IJ SOLN
INTRAMUSCULAR | Status: AC
  Filled 2018-04-18: qty 5

## 2018-04-18 MED ORDER — FENTANYL CITRATE (PF) 100 MCG/2ML IJ SOLN
INTRAMUSCULAR | Status: AC
  Filled 2018-04-18: qty 2

## 2018-04-18 MED ORDER — LACTATED RINGERS IV SOLN: Freq: Once | INTRAVENOUS | Status: DC

## 2018-04-18 MED ORDER — MIDAZOLAM HCL 2 MG/2ML IJ SOLN
INTRAMUSCULAR | Status: AC
  Filled 2018-04-18: qty 2

## 2018-04-18 MED ORDER — CEFAZOLIN SODIUM-DEXTROSE 2-4 GM/100ML-% IV SOLN: 2.0000 g | INTRAVENOUS | Status: DC

## 2018-04-18 MED ORDER — GABAPENTIN 300 MG PO CAPS
300.0000 mg | ORAL_CAPSULE | ORAL | Status: AC
  Administered 2018-04-18: 300 mg via ORAL

## 2018-04-18 MED ORDER — GABAPENTIN 300 MG PO CAPS
ORAL_CAPSULE | ORAL | Status: AC
  Administered 2018-04-18: 300 mg via ORAL
  Filled 2018-04-18: qty 1

## 2018-04-18 MED ORDER — ACETAMINOPHEN 500 MG PO TABS
1000.0000 mg | ORAL_TABLET | ORAL | Status: AC
  Administered 2018-04-18: 1000 mg via ORAL

## 2018-04-18 NOTE — Progress Notes (Signed)
Surgery has been cancelled d/t yeast infection under right breast. IV discontinued, with tip intact. Office will call patient to re-schedule.

## 2018-04-18 NOTE — Progress Notes (Signed)
Patient ID: Courtney Dixon, female   DOB: March 18, 1966, 52 y.o.   MRN: [id number]   Patient is here today for her right mastectomy.  She has shown up with a large yeast infection on the right breast along the inframammary ridge extending onto the abdominal wall and onto the breast.  She is also been smoking this morning.  Given this, the surgery needs to be canceled because of the high risk of her having ongoing infection and then wound infection with skin breakdown.  I have discussed this with her in detail.  I will have her office send Diflucan to her pharmacy and will have to delay her surgery at least 1 week.  I have also strongly encouraged her not smoke the day of surgery.

## 2018-04-18 NOTE — Anesthesia Preprocedure Evaluation (Addendum)
Anesthesia Evaluation  Patient identified by MRN, date of birth, ID band Patient awake    Reviewed: Allergy & Precautions, NPO status , Patient's Chart, lab work & pertinent test results  History of Anesthesia Complications Negative for: history of anesthetic complications  Airway Mallampati: II  TM Distance: >3 FB Neck ROM: Full    Dental  (+) Dental Advisory Given, Chipped, Poor Dentition, Missing   Pulmonary shortness of breath, COPD,  COPD inhaler, Current Smoker,    Pulmonary exam normal breath sounds clear to auscultation       Cardiovascular hypertension, Pt. on medications Normal cardiovascular exam Rhythm:Regular Rate:Normal     Neuro/Psych  Headaches, PSYCHIATRIC DISORDERS Anxiety Depression Bipolar Disorder  Neuromuscular disease    GI/Hepatic Neg liver ROS, GERD  Medicated and Controlled,  Endo/Other  diabetes, Type 2, Oral Hypoglycemic AgentsObesity   Renal/GU Renal InsufficiencyRenal disease     Musculoskeletal negative musculoskeletal ROS (+)   Abdominal   Peds  Hematology negative hematology ROS (+)   Anesthesia Other Findings Day of surgery medications reviewed with the patient.  Reproductive/Obstetrics                            Anesthesia Physical  Anesthesia Plan  ASA: III  Anesthesia Plan: General   Post-op Pain Management:  Regional for Post-op pain   Induction: Intravenous  PONV Risk Score and Plan: 2 and Midazolam, Dexamethasone and Ondansetron  Airway Management Planned: LMA  Additional Equipment:   Intra-op Plan:   Post-operative Plan: Extubation in OR  Informed Consent: I have reviewed the patients History and Physical, chart, labs and discussed the procedure including the risks, benefits and alternatives for the proposed anesthesia with the patient or authorized representative who has indicated his/her understanding and acceptance.   Dental  advisory given  Plan Discussed with: CRNA  Anesthesia Plan Comments:         Anesthesia Quick Evaluation

## 2018-04-28 ENCOUNTER — Other Ambulatory Visit: Payer: Self-pay | Admitting: Surgery

## 2018-04-28 ENCOUNTER — Encounter (HOSPITAL_COMMUNITY): Payer: Self-pay | Admitting: Urology

## 2018-04-30 NOTE — H&P (Signed)
  Queen Blossom  Location: [facility] Patient #: [id number] DOB: 08/17/1965 Divorced / Language: [name] / Race: White Female   History of Present Illness  The patient is a 52 year old female presenting for a post-operative visit. She is here for a postoperative visit regarding her radioactive seed guided right breast lumpectomy. 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 She has seen medical oncology and genetic testing. She does not want to have reexcision of margin and radiation. She does not want breast conservation. Her genetics were negative. She reports is wanting to have a mastectomy. She is otherwise been doing well.   Allergies  Sulfa Drugs  Allergies Reconciled   Medication History  Ventolin HFA (108 (90 Base)MCG/ACT Aerosol Soln, Inhalation) Active. Atorvastatin Calcium (40MG  Tablet, Oral) Active. Glimepiride (4MG  Tablet, Oral) Active. TraZODone HCl (100MG  Tablet, Oral) Active. MetFORMIN HCl ER (500MG  Tablet ER 24HR, Oral) Active. GuanFACINE HCl (1MG  Tablet, Oral) Active. HydrOXYzine HCl (10MG  Tablet, Oral) Active. Invokana (100MG  Tablet, Oral) Active. LamoTRIgine (200MG  Tablet, Oral) Active. Losartan Potassium-HCTZ (100-25MG  Tablet, Oral) Active. Omeprazole (20MG  Capsule DR, Oral) Active. Medications Reconciled  Vitals  Weight: 224.8 lb Height: 64in Body Surface Area: 2.06 m Body Mass Index: 38.59 kg/m  Temp.: 97.42F  Pulse: 87 (Regular)  BP: 138/86 (Sitting, Left Arm, Standard    Physical Exam  The physical exam findings are as follows: Note:On exam, her incision is healing well without evidence of infection. Generally well in appearance Lungs clear CV RRR Abdomen soft, NT Skin without rashes    Assessment and Plan  Right breast ductal carcinoma in situ  Impression: Again, this is a patient  with ductal carcinoma in situ of the right breast. The medial margin was positive. She again does not want conservative management with reexcision to get negative margins and radiation therapy. She will also just have a mastectomy. I discussed this with her in detail. Again, she knows that this does not offer any survival advantage. I discussed the surgical procedure in detail. We discussed referral to a plastic surgeon with immediate reconstruction but she does not want to do this. She does most proceed with a mastectomy. I discussed the risks of surgery which includes but is not limited to bleeding, infection, flap necrosis, the need for drains, cardio pulmonary issues, DVT, etc. She understands and wished to proceed with surgery which will be scheduled

## 2018-05-01 ENCOUNTER — Other Ambulatory Visit: Payer: Self-pay

## 2018-05-01 ENCOUNTER — Ambulatory Visit (HOSPITAL_COMMUNITY): Payer: Medicaid Other | Admitting: Certified Registered Nurse Anesthetist

## 2018-05-01 ENCOUNTER — Encounter (HOSPITAL_COMMUNITY): Payer: Self-pay

## 2018-05-01 ENCOUNTER — Observation Stay (HOSPITAL_COMMUNITY)
Admission: RE | Admit: 2018-05-01 | Discharge: 2018-05-02 | Disposition: A | Discharge status: Home / Self Care | Payer: Medicaid Other | Source: Ambulatory Visit | Attending: Surgery | Admitting: Surgery

## 2018-05-01 ENCOUNTER — Encounter (HOSPITAL_COMMUNITY)
Admission: RE | Disposition: A | Discharge status: Home / Self Care | Payer: Self-pay | Source: Ambulatory Visit | Attending: Surgery

## 2018-05-01 DIAGNOSIS — Z170 Estrogen receptor positive status [ER+]: Secondary | ICD-10-CM | POA: Diagnosis not present

## 2018-05-01 DIAGNOSIS — D241 Benign neoplasm of right breast: Secondary | ICD-10-CM | POA: Diagnosis not present

## 2018-05-01 DIAGNOSIS — N6081 Other benign mammary dysplasias of right breast: Secondary | ICD-10-CM | POA: Diagnosis not present

## 2018-05-01 DIAGNOSIS — I10 Essential (primary) hypertension: Secondary | ICD-10-CM | POA: Insufficient documentation

## 2018-05-01 DIAGNOSIS — F319 Bipolar disorder, unspecified: Secondary | ICD-10-CM | POA: Insufficient documentation

## 2018-05-01 DIAGNOSIS — F419 Anxiety disorder, unspecified: Secondary | ICD-10-CM | POA: Diagnosis not present

## 2018-05-01 DIAGNOSIS — D0511 Intraductal carcinoma in situ of right breast: Secondary | ICD-10-CM | POA: Diagnosis present

## 2018-05-01 DIAGNOSIS — E119 Type 2 diabetes mellitus without complications: Secondary | ICD-10-CM | POA: Insufficient documentation

## 2018-05-01 DIAGNOSIS — Z7984 Long term (current) use of oral hypoglycemic drugs: Secondary | ICD-10-CM | POA: Diagnosis not present

## 2018-05-01 DIAGNOSIS — Z79899 Other long term (current) drug therapy: Secondary | ICD-10-CM | POA: Insufficient documentation

## 2018-05-01 DIAGNOSIS — F17200 Nicotine dependence, unspecified, uncomplicated: Secondary | ICD-10-CM | POA: Insufficient documentation

## 2018-05-01 DIAGNOSIS — J449 Chronic obstructive pulmonary disease, unspecified: Secondary | ICD-10-CM | POA: Insufficient documentation

## 2018-05-01 DIAGNOSIS — Z882 Allergy status to sulfonamides status: Secondary | ICD-10-CM | POA: Diagnosis not present

## 2018-05-01 DIAGNOSIS — Z9011 Acquired absence of right breast and nipple: Secondary | ICD-10-CM

## 2018-05-01 HISTORY — DX: Cardiac murmur, unspecified: R01.1

## 2018-05-01 HISTORY — DX: Panic disorder (episodic paroxysmal anxiety): F41.0

## 2018-05-01 HISTORY — DX: Ventricular premature depolarization: I49.3

## 2018-05-01 HISTORY — DX: Low back pain, unspecified: M54.50

## 2018-05-01 HISTORY — DX: Malignant neoplasm of unspecified site of right female breast: C50.911

## 2018-05-01 HISTORY — DX: Gastro-esophageal reflux disease without esophagitis: K21.9

## 2018-05-01 HISTORY — DX: Low back pain: M54.5

## 2018-05-01 HISTORY — DX: Pure hypercholesterolemia, unspecified: E78.00

## 2018-05-01 HISTORY — DX: Type 2 diabetes mellitus with diabetic polyneuropathy: E11.42

## 2018-05-01 HISTORY — PX: TOTAL MASTECTOMY: SHX6129

## 2018-05-01 HISTORY — DX: Other intervertebral disc degeneration, lumbar region: M51.36

## 2018-05-01 HISTORY — DX: Fatty (change of) liver, not elsewhere classified: K76.0

## 2018-05-01 HISTORY — PX: MASTECTOMY: SHX3

## 2018-05-01 HISTORY — DX: Other chronic pain: G89.29

## 2018-05-01 LAB — CBC
HCT: 47.6 % — ABNORMAL HIGH (ref 36.0–46.0)
HEMOGLOBIN: 14.8 g/dL (ref 12.0–15.0)
MCH: 26.6 pg (ref 26.0–34.0)
MCHC: 31.1 g/dL (ref 30.0–36.0)
MCV: 85.6 fL (ref 80.0–100.0)
NRBC: 0 % (ref 0.0–0.2)
Platelets: 339 10*3/uL (ref 150–400)
RBC: 5.56 MIL/uL — AB (ref 3.87–5.11)
RDW: 14.1 % (ref 11.5–15.5)
WBC: 11.1 10*3/uL — AB (ref 4.0–10.5)

## 2018-05-01 LAB — BASIC METABOLIC PANEL
ANION GAP: 8 (ref 5–15)
BUN: 11 mg/dL (ref 6–20)
CO2: 31 mmol/L (ref 22–32)
Calcium: 9.8 mg/dL (ref 8.9–10.3)
Chloride: 98 mmol/L (ref 98–111)
Creatinine, Ser: 0.87 mg/dL (ref 0.44–1.00)
GFR calc Af Amer: >60 mL/min (ref >60)
GLUCOSE: 135 mg/dL — AB (ref 70–99)
POTASSIUM: 3.9 mmol/L (ref 3.5–5.1)
Sodium: 137 mmol/L (ref 135–145)

## 2018-05-01 LAB — GLUCOSE, CAPILLARY
GLUCOSE-CAPILLARY: 178 mg/dL — AB (ref 70–99)
GLUCOSE-CAPILLARY: 118 mg/dL — AB (ref 70–99)
GLUCOSE-CAPILLARY: 170 mg/dL — AB (ref 70–99)
Glucose-Capillary: 140 mg/dL — ABNORMAL HIGH (ref 70–99)
Glucose-Capillary: 106 mg/dL — ABNORMAL HIGH (ref 70–99)

## 2018-05-01 SURGERY — MASTECTOMY, SIMPLE
Anesthesia: General | Site: Breast | Laterality: Right

## 2018-05-01 MED ORDER — GLIMEPIRIDE 4 MG PO TABS
8.0000 mg | ORAL_TABLET | Freq: Every day | ORAL | Status: DC
  Administered 2018-05-02: 8 mg via ORAL
  Filled 2018-05-01: qty 2

## 2018-05-01 MED ORDER — FENTANYL CITRATE (PF) 100 MCG/2ML IJ SOLN
INTRAMUSCULAR | Status: AC
  Administered 2018-05-01: 50 ug via INTRAVENOUS
  Filled 2018-05-01: qty 2

## 2018-05-01 MED ORDER — MIDAZOLAM HCL 2 MG/2ML IJ SOLN
2.0000 mg | Freq: Once | INTRAMUSCULAR | Status: AC
  Administered 2018-05-01: 2 mg via INTRAVENOUS

## 2018-05-01 MED ORDER — PROPOFOL 10 MG/ML IV BOLUS
INTRAVENOUS | Status: AC
  Filled 2018-05-01: qty 20

## 2018-05-01 MED ORDER — EPHEDRINE SULFATE-NACL 50-0.9 MG/10ML-% IV SOSY
PREFILLED_SYRINGE | INTRAVENOUS | Status: DC | PRN
  Administered 2018-05-01 (×3): 10 mg via INTRAVENOUS

## 2018-05-01 MED ORDER — GABAPENTIN 300 MG PO CAPS
300.0000 mg | ORAL_CAPSULE | Freq: Two times a day (BID) | ORAL | Status: DC
  Administered 2018-05-01 – 2018-05-02 (×3): 300 mg via ORAL
  Filled 2018-05-01 (×3): qty 1

## 2018-05-01 MED ORDER — LIDOCAINE HCL (CARDIAC) PF 100 MG/5ML IV SOSY
PREFILLED_SYRINGE | INTRAVENOUS | Status: DC | PRN
  Administered 2018-05-01: 100 mg via INTRAVENOUS

## 2018-05-01 MED ORDER — ALBUTEROL SULFATE (2.5 MG/3ML) 0.083% IN NEBU: 3.0000 mL | INHALATION_SOLUTION | Freq: Four times a day (QID) | RESPIRATORY_TRACT | Status: DC | PRN

## 2018-05-01 MED ORDER — SUCCINYLCHOLINE CHLORIDE 200 MG/10ML IV SOSY
PREFILLED_SYRINGE | INTRAVENOUS | Status: AC
  Filled 2018-05-01: qty 10

## 2018-05-01 MED ORDER — BUPIVACAINE-EPINEPHRINE (PF) 0.5% -1:200000 IJ SOLN
INTRAMUSCULAR | Status: DC | PRN
  Administered 2018-05-01: 30 mL

## 2018-05-01 MED ORDER — DIPHENHYDRAMINE HCL 25 MG PO CAPS: 25.0000 mg | ORAL_CAPSULE | Freq: Four times a day (QID) | ORAL | Status: DC | PRN

## 2018-05-01 MED ORDER — INSULIN ASPART 100 UNIT/ML ~~LOC~~ SOLN: 0.0000 [IU] | Freq: Three times a day (TID) | SUBCUTANEOUS | Status: DC

## 2018-05-01 MED ORDER — BUPIVACAINE-EPINEPHRINE (PF) 0.5% -1:200000 IJ SOLN: INTRAMUSCULAR | Status: DC | PRN

## 2018-05-01 MED ORDER — FENTANYL CITRATE (PF) 100 MCG/2ML IJ SOLN
INTRAMUSCULAR | Status: DC | PRN
  Administered 2018-05-01 (×2): 50 ug via INTRAVENOUS

## 2018-05-01 MED ORDER — HYDROCHLOROTHIAZIDE 25 MG PO TABS
25.0000 mg | ORAL_TABLET | Freq: Every day | ORAL | Status: DC
  Administered 2018-05-02: 25 mg via ORAL
  Filled 2018-05-01 (×2): qty 1

## 2018-05-01 MED ORDER — VENLAFAXINE HCL ER 75 MG PO CP24
75.0000 mg | ORAL_CAPSULE | Freq: Every day | ORAL | Status: DC
  Administered 2018-05-02: 75 mg via ORAL
  Filled 2018-05-01 (×2): qty 1

## 2018-05-01 MED ORDER — PHENYLEPHRINE HCL 10 MG/ML IJ SOLN
INTRAMUSCULAR | Status: DC | PRN
  Administered 2018-05-01: 80 ug via INTRAVENOUS
  Administered 2018-05-01: 120 ug via INTRAVENOUS
  Administered 2018-05-01: 80 ug via INTRAVENOUS

## 2018-05-01 MED ORDER — LACTATED RINGERS IV SOLN
INTRAVENOUS | Status: DC
  Administered 2018-05-01 (×2): via INTRAVENOUS

## 2018-05-01 MED ORDER — POTASSIUM CHLORIDE IN NACL 20-0.9 MEQ/L-% IV SOLN
INTRAVENOUS | Status: DC
  Administered 2018-05-01 – 2018-05-02 (×2): via INTRAVENOUS
  Filled 2018-05-01 (×2): qty 1000

## 2018-05-01 MED ORDER — MIDAZOLAM HCL 5 MG/5ML IJ SOLN
INTRAMUSCULAR | Status: DC | PRN
  Administered 2018-05-01: 2 mg via INTRAVENOUS

## 2018-05-01 MED ORDER — FENTANYL CITRATE (PF) 250 MCG/5ML IJ SOLN
INTRAMUSCULAR | Status: AC
  Filled 2018-05-01: qty 5

## 2018-05-01 MED ORDER — CELECOXIB 200 MG PO CAPS
ORAL_CAPSULE | ORAL | Status: AC
  Administered 2018-05-01: 200 mg via ORAL
  Filled 2018-05-01: qty 1

## 2018-05-01 MED ORDER — HYDROMORPHONE HCL 1 MG/ML IJ SOLN: 0.2500 mg | INTRAMUSCULAR | Status: DC | PRN

## 2018-05-01 MED ORDER — DIPHENHYDRAMINE HCL 50 MG/ML IJ SOLN: 25.0000 mg | Freq: Four times a day (QID) | INTRAMUSCULAR | Status: DC | PRN

## 2018-05-01 MED ORDER — ONDANSETRON HCL 4 MG/2ML IJ SOLN
INTRAMUSCULAR | Status: DC | PRN
  Administered 2018-05-01: 4 mg via INTRAVENOUS

## 2018-05-01 MED ORDER — TRAMADOL HCL 50 MG PO TABS
50.0000 mg | ORAL_TABLET | Freq: Four times a day (QID) | ORAL | Status: DC | PRN
  Administered 2018-05-02: 50 mg via ORAL
  Filled 2018-05-01: qty 1

## 2018-05-01 MED ORDER — LOSARTAN POTASSIUM 50 MG PO TABS
100.0000 mg | ORAL_TABLET | Freq: Every day | ORAL | Status: DC
  Administered 2018-05-02: 100 mg via ORAL
  Filled 2018-05-01 (×2): qty 2

## 2018-05-01 MED ORDER — LAMOTRIGINE 100 MG PO TABS
200.0000 mg | ORAL_TABLET | Freq: Every day | ORAL | Status: DC
  Administered 2018-05-02: 200 mg via ORAL
  Filled 2018-05-01 (×2): qty 2

## 2018-05-01 MED ORDER — MIDAZOLAM HCL 2 MG/2ML IJ SOLN
INTRAMUSCULAR | Status: AC
  Administered 2018-05-01: 2 mg via INTRAVENOUS
  Filled 2018-05-01: qty 2

## 2018-05-01 MED ORDER — CEFAZOLIN SODIUM-DEXTROSE 2-4 GM/100ML-% IV SOLN
2.0000 g | INTRAVENOUS | Status: AC
  Administered 2018-05-01: 2 g via INTRAVENOUS
  Filled 2018-05-01: qty 100

## 2018-05-01 MED ORDER — CHLORHEXIDINE GLUCONATE CLOTH 2 % EX PADS: 6.0000 | MEDICATED_PAD | Freq: Once | CUTANEOUS | Status: DC

## 2018-05-01 MED ORDER — ONDANSETRON HCL 4 MG/2ML IJ SOLN: 4.0000 mg | Freq: Once | INTRAMUSCULAR | Status: DC | PRN

## 2018-05-01 MED ORDER — FENTANYL CITRATE (PF) 100 MCG/2ML IJ SOLN: 50.0000 ug | INTRAMUSCULAR | Status: DC | PRN

## 2018-05-01 MED ORDER — ONDANSETRON HCL 4 MG/2ML IJ SOLN
INTRAMUSCULAR | Status: AC
  Filled 2018-05-01: qty 2

## 2018-05-01 MED ORDER — ENOXAPARIN SODIUM 40 MG/0.4ML ~~LOC~~ SOLN: 40.0000 mg | SUBCUTANEOUS | Status: DC

## 2018-05-01 MED ORDER — MEPERIDINE HCL 50 MG/ML IJ SOLN: 6.2500 mg | INTRAMUSCULAR | Status: DC | PRN

## 2018-05-01 MED ORDER — CANAGLIFLOZIN 100 MG PO TABS
100.0000 mg | ORAL_TABLET | Freq: Every day | ORAL | Status: DC
  Administered 2018-05-02: 100 mg via ORAL
  Filled 2018-05-01: qty 1

## 2018-05-01 MED ORDER — METHOCARBAMOL 500 MG PO TABS
500.0000 mg | ORAL_TABLET | Freq: Four times a day (QID) | ORAL | Status: DC | PRN
  Administered 2018-05-02: 500 mg via ORAL
  Filled 2018-05-01: qty 1

## 2018-05-01 MED ORDER — LOSARTAN POTASSIUM-HCTZ 100-25 MG PO TABS: 1.0000 | ORAL_TABLET | Freq: Every day | ORAL | Status: DC

## 2018-05-01 MED ORDER — GABAPENTIN 300 MG PO CAPS
300.0000 mg | ORAL_CAPSULE | ORAL | Status: AC
  Administered 2018-05-01: 300 mg via ORAL
  Filled 2018-05-01: qty 1

## 2018-05-01 MED ORDER — PREGABALIN 50 MG PO CAPS
100.0000 mg | ORAL_CAPSULE | Freq: Three times a day (TID) | ORAL | Status: DC
  Administered 2018-05-01 – 2018-05-02 (×3): 100 mg via ORAL
  Filled 2018-05-01 (×3): qty 2

## 2018-05-01 MED ORDER — ONDANSETRON 4 MG PO TBDP: 4.0000 mg | ORAL_TABLET | Freq: Four times a day (QID) | ORAL | Status: DC | PRN

## 2018-05-01 MED ORDER — 0.9 % SODIUM CHLORIDE (POUR BTL) OPTIME
TOPICAL | Status: DC | PRN
  Administered 2018-05-01: 1000 mL

## 2018-05-01 MED ORDER — ONDANSETRON HCL 4 MG/2ML IJ SOLN: 4.0000 mg | Freq: Four times a day (QID) | INTRAMUSCULAR | Status: DC | PRN

## 2018-05-01 MED ORDER — CELECOXIB 200 MG PO CAPS
200.0000 mg | ORAL_CAPSULE | ORAL | Status: AC
  Administered 2018-05-01: 200 mg via ORAL

## 2018-05-01 MED ORDER — ACETAMINOPHEN 500 MG PO TABS
1000.0000 mg | ORAL_TABLET | ORAL | Status: AC
  Administered 2018-05-01: 1000 mg via ORAL
  Filled 2018-05-01: qty 2

## 2018-05-01 MED ORDER — INSULIN ASPART 100 UNIT/ML ~~LOC~~ SOLN: 0.0000 [IU] | Freq: Every day | SUBCUTANEOUS | Status: DC

## 2018-05-01 MED ORDER — PANTOPRAZOLE SODIUM 40 MG PO TBEC
40.0000 mg | DELAYED_RELEASE_TABLET | Freq: Every day | ORAL | Status: DC
  Administered 2018-05-02: 40 mg via ORAL
  Filled 2018-05-01 (×2): qty 1

## 2018-05-01 MED ORDER — MIDAZOLAM HCL 2 MG/2ML IJ SOLN
INTRAMUSCULAR | Status: AC
  Filled 2018-05-01: qty 2

## 2018-05-01 MED ORDER — HYDROCODONE-ACETAMINOPHEN 5-325 MG PO TABS
1.0000 | ORAL_TABLET | ORAL | Status: DC | PRN
  Administered 2018-05-01: 1 via ORAL
  Administered 2018-05-01: 2 via ORAL
  Administered 2018-05-02: 1 via ORAL
  Filled 2018-05-01: qty 1
  Filled 2018-05-01: qty 2
  Filled 2018-05-01: qty 1

## 2018-05-01 MED ORDER — ALPRAZOLAM 0.5 MG PO TABS
1.0000 mg | ORAL_TABLET | Freq: Two times a day (BID) | ORAL | Status: DC
  Administered 2018-05-01 – 2018-05-02 (×3): 1 mg via ORAL
  Filled 2018-05-01 (×3): qty 2

## 2018-05-01 MED ORDER — PROPOFOL 10 MG/ML IV BOLUS
INTRAVENOUS | Status: DC | PRN
  Administered 2018-05-01 (×2): 50 mg via INTRAVENOUS

## 2018-05-01 MED ORDER — FENTANYL CITRATE (PF) 100 MCG/2ML IJ SOLN
50.0000 ug | Freq: Once | INTRAMUSCULAR | Status: AC
  Administered 2018-05-01: 50 ug via INTRAVENOUS

## 2018-05-01 MED ORDER — EPHEDRINE 5 MG/ML INJ
INTRAVENOUS | Status: AC
  Filled 2018-05-01: qty 10

## 2018-05-01 SURGICAL SUPPLY — 38 items
BENZOIN TINCTURE PRP APPL 2/3 (GAUZE/BANDAGES/DRESSINGS) IMPLANT
BINDER BREAST XLRG (GAUZE/BANDAGES/DRESSINGS) ×3 IMPLANT
CHLORAPREP W/TINT 26ML (MISCELLANEOUS) ×3 IMPLANT
CLOSURE STERI-STRIP 1/2X4 (GAUZE/BANDAGES/DRESSINGS)
CLSR STERI-STRIP ANTIMIC 1/2X4 (GAUZE/BANDAGES/DRESSINGS) IMPLANT
COVER SURGICAL LIGHT HANDLE (MISCELLANEOUS) ×3 IMPLANT
DERMABOND ADHESIVE PROPEN (GAUZE/BANDAGES/DRESSINGS) ×2
DERMABOND ADVANCED (GAUZE/BANDAGES/DRESSINGS) ×2
DERMABOND ADVANCED .7 DNX12 (GAUZE/BANDAGES/DRESSINGS) ×1 IMPLANT
DERMABOND ADVANCED .7 DNX6 (GAUZE/BANDAGES/DRESSINGS) ×1 IMPLANT
DRAIN CHANNEL 19F RND (DRAIN) ×3 IMPLANT
DRAPE LAPAROSCOPIC ABDOMINAL (DRAPES) ×3 IMPLANT
DRAPE UTILITY XL STRL (DRAPES) ×6 IMPLANT
DRSG PAD ABDOMINAL 8X10 ST (GAUZE/BANDAGES/DRESSINGS) ×6 IMPLANT
ELECT CAUTERY BLADE 6.4 (BLADE) ×3 IMPLANT
ELECT REM PT RETURN 9FT ADLT (ELECTROSURGICAL) ×3
ELECTRODE REM PT RTRN 9FT ADLT (ELECTROSURGICAL) ×1 IMPLANT
EVACUATOR SILICONE 100CC (DRAIN) ×3 IMPLANT
GAUZE SPONGE 4X4 12PLY STRL (GAUZE/BANDAGES/DRESSINGS) ×3 IMPLANT
GLOVE SURG SIGNA 7.5 PF LTX (GLOVE) ×3 IMPLANT
GOWN STRL REUS W/ TWL LRG LVL3 (GOWN DISPOSABLE) ×2 IMPLANT
GOWN STRL REUS W/ TWL XL LVL3 (GOWN DISPOSABLE) ×1 IMPLANT
GOWN STRL REUS W/TWL LRG LVL3 (GOWN DISPOSABLE) ×4
GOWN STRL REUS W/TWL XL LVL3 (GOWN DISPOSABLE) ×2
KIT BASIN OR (CUSTOM PROCEDURE TRAY) ×3 IMPLANT
KIT TURNOVER KIT B (KITS) ×3 IMPLANT
NS IRRIG 1000ML POUR BTL (IV SOLUTION) ×3 IMPLANT
PACK GENERAL/GYN (CUSTOM PROCEDURE TRAY) ×3 IMPLANT
PAD ARMBOARD 7.5X6 YLW CONV (MISCELLANEOUS) ×6 IMPLANT
SPECIMEN JAR X LARGE (MISCELLANEOUS) ×3 IMPLANT
STAPLER VISISTAT 35W (STAPLE) ×3 IMPLANT
SUT ETHILON 2 0 FS 18 (SUTURE) ×3 IMPLANT
SUT ETHILON 3 0 FSL (SUTURE) ×3 IMPLANT
SUT MNCRL AB 4-0 PS2 18 (SUTURE) ×3 IMPLANT
SUT SILK 2 0 SH (SUTURE) ×3 IMPLANT
SUT VIC AB 3-0 SH 18 (SUTURE) ×6 IMPLANT
TAPE CLOTH SURG 4X10 WHT LF (GAUZE/BANDAGES/DRESSINGS) ×3 IMPLANT
TOWEL OR 17X26 10 PK STRL BLUE (TOWEL DISPOSABLE) ×3 IMPLANT

## 2018-05-01 NOTE — Anesthesia Procedure Notes (Signed)
Procedure Name: LMA Insertion Date/Time: 05/01/2018 9:21 AM Performed by: Carney Living, CRNA Pre-anesthesia Checklist: Patient identified, Emergency Drugs available, Suction available, Patient being monitored and Timeout performed Patient Re-evaluated:Patient Re-evaluated prior to induction Oxygen Delivery Method: Circle system utilized Preoxygenation: Pre-oxygenation with 100% oxygen Induction Type: IV induction LMA: LMA inserted Number of attempts: 1 Placement Confirmation: positive ETCO2 and breath sounds checked- equal and bilateral Tube secured with: Tape Dental Injury: Teeth and Oropharynx as per pre-operative assessment

## 2018-05-01 NOTE — Anesthesia Postprocedure Evaluation (Signed)
Anesthesia Post Note  Patient: Niaja Stickley  Procedure(s) Performed: RIGHT MASTECTOMY (Right Breast)     Patient location during evaluation: PACU Anesthesia Type: General Level of consciousness: awake and alert Pain management: pain level controlled Vital Signs Assessment: post-procedure vital signs reviewed and stable Respiratory status: spontaneous breathing, nonlabored ventilation, respiratory function stable and patient connected to nasal cannula oxygen Cardiovascular status: blood pressure returned to baseline and stable Postop Assessment: no apparent nausea or vomiting Anesthetic complications: no    Last Vitals:  Vitals:   05/01/18 1312 05/01/18 1342  BP: 99/62 (!) 93/58  Pulse: 87 81  Resp: 20 12  Temp: (!) 36.4 C 36.9 C  SpO2: 95% 96%    Last Pain:  Vitals:   05/01/18 1342  TempSrc: Oral  PainSc:                  Bransyn Adami DAVID

## 2018-05-01 NOTE — Op Note (Signed)
RIGHT MASTECTOMY  Procedure Note  Courtney Dixon 05/01/2018   Pre-op Diagnosis: RIGHT BREAST DUCTAL CARCINOMA IN SITU     Post-op Diagnosis: same  Procedure(s): RIGHT MASTECTOMY  Surgeon(s): Coralie Keens, MD  Anesthesia: General  Staff:  Circulator: Kari Baars, RN Relief Scrub: Starleen Arms Scrub Person: Ulyses Amor, Lenna Sciara S  Estimated Blood Loss: Minimal               Specimens: sent to path  Indications: This is a 52 year old female who was found to have a small mass in the right breast on screening mammography.  A biopsy was performed showing a complex sclerosing lesion.  The decision was made to proceed with a lumpectomy.  This showed a small area of DCIS with a positive margins.  After long discussion, the patient requested to have a right mastectomy without reconstruction.  Procedure: The patient was brought to the operating room and identified the correct patient.  She is placed upon the operating table general anesthesia was induced.  Her right breast and axilla and chest were prepped and draped in the usual sterile fashion.  I performed an elliptical incision transversely across the chest incorporating the nipple areolar complex.  I then dissected down to the breast tissue with the cautery.  I then performed inferior and superior skin flap staying just underneath the dermis removing all the breast tissue going to the clavicle and down to the inframammary ridge as well as medial and lateral to the axilla.  Once the flaps were created I then removed the breast from the underlying pectoralis muscle again moving medial to lateral toward the axilla.  I then completed the mastectomy excising all the breast tissue toward the axilla.  I marked the mastectomy specimen laterally with a suture silk suture.  The mastectomy was then sent to pathology for evaluation.  I then irrigated the incision the chest wall with saline.  Hemostasis appeared to be achieved.  I made a  separate skin incision and placed a 33 French Blake drain into the axilla and mastectomy site.  This was secured in place with a 2-0 nylon suture.  I then closed the subcutaneous tissue with interrupted 3-0 Vicryl sutures and closed the skin with a running 4-0 Monocryl.  Dermabond, gauze, and a breast binder were then placed.  The drain was placed to bulb suction.  The patient tolerated the procedure well.  All the counts were correct at the end of the procedure.  The patient was then extubated in the operating room and taken in a stable condition to the recovery room.          Courtney Dixon A   Date: 05/01/2018  Time: 10:35 AM

## 2018-05-01 NOTE — Transfer of Care (Signed)
Immediate Anesthesia Transfer of Care Note  Patient: Courtney Dixon  Procedure(s) Performed: RIGHT MASTECTOMY (Right Breast)  Patient Location: PACU  Anesthesia Type:General  Level of Consciousness: awake, alert , oriented and patient cooperative  Airway & Oxygen Therapy: Patient Spontanous Breathing and Patient connected to nasal cannula oxygen  Post-op Assessment: Report given to RN, Post -op Vital signs reviewed and stable and Patient moving all extremities X 4  Post vital signs: Reviewed and stable  Last Vitals:  Vitals Value Taken Time  BP 114/52 05/01/2018 10:42 AM  Temp    Pulse 88 05/01/2018 10:46 AM  Resp 13 05/01/2018 10:46 AM  SpO2 93 % 05/01/2018 10:46 AM  Vitals shown include unvalidated device data.  Last Pain:  Vitals:   05/01/18 0731  PainSc: 4       Patients Stated Pain Goal: 2 (53/29/92 4268)  Complications: No apparent anesthesia complications

## 2018-05-01 NOTE — Anesthesia Preprocedure Evaluation (Addendum)
Anesthesia Evaluation  Patient identified by MRN, date of birth, ID band Patient awake    Reviewed: Allergy & Precautions, NPO status , Patient's Chart, lab work & pertinent test results  Airway Mallampati: I  TM Distance: >3 FB Neck ROM: Full    Dental  (+) Teeth Intact, Dental Advisory Given   Pulmonary COPD,  COPD inhaler, Current Smoker,    Pulmonary exam normal        Cardiovascular hypertension, Pt. on medications Normal cardiovascular exam     Neuro/Psych Anxiety Depression Bipolar Disorder    GI/Hepatic   Endo/Other  diabetes, Type 2, Oral Hypoglycemic Agents  Renal/GU      Musculoskeletal   Abdominal   Peds  Hematology   Anesthesia Other Findings   Reproductive/Obstetrics                            Anesthesia Physical Anesthesia Plan  ASA: II  Anesthesia Plan: General   Post-op Pain Management:  Regional for Post-op pain   Induction: Intravenous  PONV Risk Score and Plan: 2 and Ondansetron, Midazolam and Treatment may vary due to age or medical condition  Airway Management Planned: LMA  Additional Equipment:   Intra-op Plan:   Post-operative Plan: Extubation in OR  Informed Consent: I have reviewed the patients History and Physical, chart, labs and discussed the procedure including the risks, benefits and alternatives for the proposed anesthesia with the patient or authorized representative who has indicated his/her understanding and acceptance.   Dental advisory given  Plan Discussed with: CRNA, Surgeon and Anesthesiologist  Anesthesia Plan Comments:        Anesthesia Quick Evaluation

## 2018-05-01 NOTE — Interval H&P Note (Signed)
History and Physical Interval Note: no change in H and P.  Yeast infection under right breast resolved  05/01/2018 8:44 AM  Ashley Murrain  has presented today for surgery, with the diagnosis of RIGHT BREAST DUCTAL CARCINOMA IN SITU  The various methods of treatment have been discussed with the patient and family. After consideration of risks, benefits and other options for treatment, the patient has consented to  Procedure(s): RIGHT MASTECTOMY (Right) as a surgical intervention .  The patient's history has been reviewed, patient examined, no change in status, stable for surgery.  I have reviewed the patient's chart and labs.  Questions were answered to the patient's satisfaction.     Izabell Schalk A

## 2018-05-01 NOTE — Anesthesia Procedure Notes (Signed)
Anesthesia Regional Block: Pectoralis block   Pre-Anesthetic Checklist: ,, timeout performed, Correct Patient, Correct Site, Correct Laterality, Correct Procedure, Correct Position, site marked, Risks and benefits discussed,  Surgical consent,  Pre-op evaluation,  At surgeon's request and post-op pain management  Laterality: Right  Prep: chloraprep       Needles:  Injection technique: Single-shot     Needle Length: 9cm  Needle Gauge: 21     Additional Needles:   Narrative:  Start time: 05/01/2018 8:30 AM End time: 05/01/2018 8:40 AM Injection made incrementally with aspirations every 5 mL.  Performed by: Personally  Anesthesiologist: Lillia Abed, MD  Additional Notes: Monitors applied. Patient sedated. Sterile prep and drape,hand hygiene and sterile gloves were used. Relevant anatomy identified.Needle position confirmed.Local anesthetic injected incrementally after negative aspiration. Local anesthetic spread visualized. Vascular puncture avoided. No complications. Image printed for medical record.The patient tolerated the procedure well.

## 2018-05-02 ENCOUNTER — Encounter (HOSPITAL_COMMUNITY): Payer: Self-pay | Admitting: Surgery

## 2018-05-02 DIAGNOSIS — D0511 Intraductal carcinoma in situ of right breast: Secondary | ICD-10-CM | POA: Diagnosis not present

## 2018-05-02 LAB — GLUCOSE, CAPILLARY
GLUCOSE-CAPILLARY: 123 mg/dL — AB (ref 70–99)
GLUCOSE-CAPILLARY: 82 mg/dL (ref 70–99)
Glucose-Capillary: 144 mg/dL — ABNORMAL HIGH (ref 70–99)

## 2018-05-02 MED ORDER — ALPRAZOLAM 1 MG PO TABS: 1.0000 mg | ORAL_TABLET | Freq: Two times a day (BID) | ORAL | 0 refills | Status: AC | PRN

## 2018-05-02 MED ORDER — HYDROCODONE-ACETAMINOPHEN 5-325 MG PO TABS: 1.0000 | ORAL_TABLET | Freq: Four times a day (QID) | ORAL | 0 refills | Status: AC | PRN

## 2018-05-02 NOTE — Progress Notes (Signed)
Patient ID: Courtney Dixon, female   DOB: 18-Apr-1966, 52 y.o.   MRN: [id number]   No complaints this morning Mastectomy site looks good  Plan: Discharge home after lunch

## 2018-05-02 NOTE — Progress Notes (Signed)
   05/02/18 1400  Clinical Encounter Type  Visited With Patient  Visit Type Initial;Spiritual support;Social support  Spiritual Encounters  Spiritual Needs Emotional   Introductory visit w/ pt.  Pt self-reports to be in good spirits, her affect is consist with that.  Expressed relief at finally having procedure after 2 cancellations since May.  When asked about support, reports that she has lots of ppl praying for her and that some participants in online cooking groups have become friends and are also praying for her.  Pt expects to d/c in a few hrs.  Expressed gratitude for visit.   05/02/18 1400  Clinical Encounter Type  Visited With Patient  Visit Type Initial;Spiritual support;Social support  Spiritual Encounters  Spiritual Needs Emotional    Myra Gianotti resident, [phone]

## 2018-05-02 NOTE — Discharge Instructions (Signed)
[facility] °[phone] ° °MASTECTOMY: POST OP INSTRUCTIONS ° °Always review your discharge instruction sheet given to you by the facility where your surgery was performed. °IF YOU HAVE DISABILITY OR FAMILY LEAVE FORMS, YOU MUST BRING THEM TO THE OFFICE FOR PROCESSING.   °DO NOT GIVE THEM TO YOUR DOCTOR. °A prescription for pain medication may be given to you upon discharge.  Take your pain medication as prescribed, if needed.  If narcotic pain medicine is not needed, then you may take acetaminophen (Tylenol) or ibuprofen (Advil) as needed. °1. Take your usually prescribed medications unless otherwise directed. °2. If you need a refill on your pain medication, please contact your pharmacy.  They will contact our office to request authorization.  Prescriptions will not be filled after 5pm or on week-ends. °3. You should follow a light diet the first few days after arrival home, such as soup and crackers, etc.  Resume your normal diet the day after surgery. °4. Most patients will experience some swelling and bruising on the chest and underarm.  Ice packs will help.  Swelling and bruising can take several days to resolve.  °5. It is common to experience some constipation if taking pain medication after surgery.  Increasing fluid intake and taking a stool softener (such as Colace) will usually help or prevent this problem from occurring.  A mild laxative (Milk of Magnesia or Miralax) should be taken according to package instructions if there are no bowel movements after 48 hours. °6. Unless discharge instructions indicate otherwise, leave your bandage dry and in place until your next appointment in 3-5 days.  You may take a limited sponge bath.  No tube baths or showers until the drains are removed.  You may have steri-strips (small skin tapes) in place directly over the incision.  These strips should be left on the skin for 7-10 days.  If your surgeon used skin glue on the incision, you may  shower in 24 hours.  The glue will flake off over the next 2-3 weeks.  Any sutures or staples will be removed at the office during your follow-up visit. °7. DRAINS:  If you have drains in place, it is important to keep a list of the amount of drainage produced each day in your drains.  Before leaving the hospital, you should be instructed on drain care.  Call our office if you have any questions about your drains. °8. ACTIVITIES:  You may resume regular (light) daily activities beginning the next day--such as daily self-care, walking, climbing stairs--gradually increasing activities as tolerated.  You may have sexual intercourse when it is comfortable.  Refrain from any heavy lifting or straining until approved by your doctor. °a. You may drive when you are no longer taking prescription pain medication, you can comfortably wear a seatbelt, and you can safely maneuver your car and apply brakes. °b. RETURN TO WORK:  __________________________________________________________ °9. You should see your doctor in the office for a follow-up appointment approximately 3-5 days after your surgery.  Your doctor’s nurse will typically make your follow-up appointment when she calls you with your pathology report.  Expect your pathology report 2-3 business days after your surgery.  You may call to check if you do not hear from us after three days.   °10. OTHER INSTRUCTIONS: ______________________________________________________________________________________________ ____________________________________________________________________________________________ °WHEN TO CALL YOUR DOCTOR: °1. Fever over 101.0 °2. Nausea and/or vomiting °3. Extreme swelling or bruising °4. Continued bleeding from incision. °5. Increased pain, redness, or drainage from the incision. °  The clinic staff is available to answer your questions during regular business hours.  Please don’t hesitate to call and ask to speak to one of the nurses for clinical  concerns.  If you have a medical emergency, go to the nearest emergency room or call 911.  A surgeon from [facility] is always on call at the hospital. °[address], [address], [place], [place]  [address] ? [address] [address], [place], [place]   [address] °[phone] ? [phone] ? FAX [phone] °Web site: [email or web address] °

## 2018-05-02 NOTE — Discharge Summary (Signed)
Physician Discharge Summary  Patient ID: Courtney Dixon MRN: [id number] DOB/AGE: 1965-12-18 52 y.o.  Admit date: 05/01/2018 Discharge date: 05/02/2018  Admission Diagnoses:  Discharge Diagnoses:  Active Problems:   S/P mastectomy, right right breast DCIS  Discharged Condition: good  Hospital Course: uneventful post op recovery.  Discharged home POD#1  Consults: None  Significant Diagnostic Studies:   Treatments: surgery: right mastectomy  Discharge Exam: Blood pressure 130/82, pulse 87, temperature 98.1 F (36.7 C), temperature source Oral, resp. rate 18, height 5' 4"  (1.626 m), weight 98 kg, SpO2 94 %. General appearance: alert, cooperative and no distress Resp: clear to auscultation bilaterally Cardio: regular rate and rhythm, S1, S2 normal, no murmur, click, rub or gallop Incision/Wound: right chest flaps viable, no hematoma, drain serosang  Disposition: Discharge disposition: 01-Home or Self Care        Allergies as of 05/02/2018      Reactions   Morphine And Related Other (See Comments)   UNSPECIFIED REACTION  "Does not settle well with patient"   Codeine Anxiety   Sulfa Antibiotics Itching      Medication List    TAKE these medications   ACCU-CHEK AVIVA PLUS w/Device Kit USE AS DIRECTED ONCE DAILY   ACCU-CHEK SOFTCLIX LANCETS lancets USE AS DIRECTED ONCE DAILY   acetaminophen 500 MG tablet Commonly known as:  TYLENOL Take 1,500 mg by mouth every 6 (six) hours as needed for moderate pain or headache.   ALPRAZolam 1 MG tablet Commonly known as:  XANAX Take 1 tablet (1 mg total) by mouth 2 (two) times daily as needed for anxiety. What changed:    when to take this  reasons to take this   atorvastatin 40 MG tablet Commonly known as:  LIPITOR Take 1 tablet (40 mg total) by mouth daily.   empagliflozin 25 MG Tabs tablet Commonly known as:  JARDIANCE Take 25 mg by mouth daily.   escitalopram 10 MG tablet Commonly known as:   LEXAPRO Take 1 tablet (10 mg total) by mouth daily.   fluconazole 100 MG tablet Commonly known as:  DIFLUCAN Take 100 mg by mouth daily.   glimepiride 4 MG tablet Commonly known as:  AMARYL Take 2 tablets (8 mg total) by mouth daily with breakfast.   glucose blood test strip Use as instructed   HYDROcodone-acetaminophen 5-325 MG tablet Commonly known as:  NORCO/VICODIN Take 1 tablet by mouth every 6 (six) hours as needed for moderate pain.   hydrOXYzine 25 MG tablet Commonly known as:  ATARAX/VISTARIL TAKE 1 TABLET BY MOUTH 2 TIMES DAILY. What changed:    how much to take  when to take this   ibuprofen 200 MG tablet Commonly known as:  ADVIL,MOTRIN Take 800 mg by mouth every 6 (six) hours as needed for headache or moderate pain.   INVOKANA 100 MG Tabs tablet Generic drug:  canagliflozin Take 100 mg by mouth daily before breakfast.   ketorolac 10 MG tablet Commonly known as:  TORADOL Take one tab by mouth 2 or 3 times daily as needed for headache.   lamoTRIgine 200 MG tablet Commonly known as:  LAMICTAL Take 1 tablet (200 mg total) by mouth daily.   LORazepam 0.5 MG tablet Commonly known as:  ATIVAN Take one tab PO BID to TID prn for anxiety   losartan-hydrochlorothiazide 100-25 MG tablet Commonly known as:  HYZAAR Take 1 tablet by mouth daily.   metFORMIN 500 MG 24 hr tablet Commonly known as:  GLUCOPHAGE-XR Take 2 tablets (1,000  mg total) by mouth 2 (two) times daily.   omeprazole 20 MG capsule Commonly known as:  PRILOSEC TAKE 1 CAPSULE BY MOUTH DAILY.   pregabalin 100 MG capsule Commonly known as:  LYRICA Take 1 capsule (100 mg total) by mouth 3 (three) times daily. For PASS dose change   traZODone 50 MG tablet Commonly known as:  DESYREL Take 1 tablet (50 mg total) by mouth at bedtime.   venlafaxine XR 75 MG 24 hr capsule Commonly known as:  EFFEXOR-XR Take 75 mg by mouth daily with breakfast.   VENTOLIN HFA 108 (90 Base) MCG/ACT  inhaler Generic drug:  albuterol INHALE 2 PUFFS INTO THE LUNGS EVERY 6 (SIX) HOURS AS NEEDED FOR WHEEZING OR SHORTNESS OF BREATH. What changed:  See the new instructions.      Follow-up Information    Coralie Keens, MD. Call on 05/16/2018.   Specialty:  General Surgery Contact information: [address]  [place] [address] [phone]           Signed: Harl Bowie 05/02/2018, 6:32 AM

## 2018-06-16 ENCOUNTER — Other Ambulatory Visit: Payer: Self-pay

## 2018-06-16 DIAGNOSIS — Z170 Estrogen receptor positive status [ER+]: Principal | ICD-10-CM

## 2018-06-16 DIAGNOSIS — C50311 Malignant neoplasm of lower-inner quadrant of right female breast: Secondary | ICD-10-CM

## 2018-06-18 NOTE — Progress Notes (Signed)
[name]  [phone] [phone]     ID: Etna Forquer DOB: 08-20-1965  MR#: [id number]  CHY#:[id number]  Patient Care Team: Marry Guan as PCP - General (Family Medicine) Magrinat, Virgie Dad, MD as Consulting Physician (Oncology) Coralie Keens, MD as Consulting Physician (General Surgery) Kyung Rudd, MD as Consulting Physician (Radiation Oncology) Marry Guan as Consulting Physician (Family Medicine) Vicenta Aly, NP (Inactive) as Nurse Practitioner (Psychiatry) OTHER MD:  CHIEF COMPLAINT: Estrogen receptor positive ductal carcinoma in situ  CURRENT TREATMENT: tamoxifen   HISTORY OF CURRENT ILLNESS: From the original intake note:  Courtney Dixon had routine screening mammography on 11/03/2017 showing a possible abnormality in the right breast. She underwent unilateral right diagnostic mammography with tomography and right breast ultrasonography at [facility] on 11/08/2017 showing: breast density category B. There was a suspicious lesion in the right breast lower inner quadrant at 4 o'clock measuring 2.0 x 1.7 x 1.9 cm and located 3 cm the nipple. There was also a probably benign small mass in the 2:30 o'clock position of the right breast measuring 0.9 x 0.4 x 0.7 cm. Sonographic evaluation of the right axilla showed no enlarged or abnormal lymph nodes  Accordingly on 11/11/2017 she proceeded to biopsy of the right breast area in question. The pathology from this procedure showed ([id number]): a complex sclerosing lesion with an intraductal papilloma and calcifications.   A second biopsy ([id number]) of this are on 11/16/2017 showed: Fibroadenoma with calcifications and no evidence of malignancy.   Finally, she underwent right lumpectomy ([id number]) on 01/11/2018 showing: Ductal carcinoma in situ intermediate grade, measuring 0.6 cm, arising in a complex sclerosing lesion. Carcinoma extends to the medial margin.  Prognostic indicators significant for: estrogen receptor, 100% positive and progesterone receptor, 100% positive, both with strong staining intensity.   The patient's subsequent history is as detailed below.  INTERVAL HISTORY: Courtney Dixon returns today for follow-up and treatment of of her estrogen receptor positive ductal carcinoma in situ.   Since her last visit here on 02/21/2018, she underwent [facility] Testing on 03/06/2018 showing two variants of uncertain significance in the ATM and RNF43 genes. In ATM, there is a variant at c.8156G>A (p.Arg2719His). In RNF43, there is a variant at c.833A>T (p.Glu278Val). The following genes were tested for variants: AIP, ALK, APC, ATM, AXIN2, BAP1, BARD1, BLM, BMPR1A, BRCA1, BRCA2, BRIP1, BUB1B, CASR, CDC73, CDH1, CDK4, CDKN1B, CDKN1C, CDKN2A, CEBPA, CEP57, CHEK2, CTNNA1, DICER1, DIS3L2, EGFR, ENG, EPCAM, FH, FLCN, GALNT12, GATA2, GPC3, GREM1, HOXB13, HRAS, KIT, MAX, MEN1, MET, MITF, MLH1, MLH3, MSH2, MSH3, MSH6, MUTYH, NBN, NF1, NF2, NTHL1, PALB2, PDGFRA, PHOX2B, PMS2, POLD1, POLE, POT1, PRKAR1A, PTCH1, PTEN, RAD50, RAD51C, RAD51D, RB1, RECQL4, RET, RNF43, RPS20, RUNX1, SDHA, SDHAF2, SDHB, SDHC, SDHD, SMAD4, SMARCA4, SMARCB1, SMARCE1, STK11, SUFU, TERC, TERT, TMEM127, TP53, TSC1, TSC2, VHL, WRN, and WT1.  She also underwent a right mastectomy on 05/01/2018. The pathology from this procedure showed ([id number]):  breast, simple mastectomy, right - residual ductal carcinoma in situ, low nuclear grade, with calcifications - usual ductal hyperplasia, atypical lobular hyperplasia, and fibrocystic changes including apocrine metaplasia - intraductal papilloma - previous surgical site changes - margins uninvolved by carcinoma - There is a residual microscopic focus of low grade ductal carcinoma in situ (1C) at the previous lumpectomy site; there is no evidence of invasive carcinoma.  She started tamoxifen in 05/14/2018. She has been having hot flashes, but she  ran out of Effexor, which she thinks may conttribute to the increase in  hot flashes.Marland Kitchen    REVIEW OF SYSTEMS: Courtney Dixon requested a urine check because she feels burning during urination; she has seen no blood. She has been dealing with this on and off for several years. She stated that she lost twenty Dixon over the last few months, which she attributes to Effexor. The patient denies unusual headaches, visual changes, nausea, vomiting, or dizziness. There has been no unusual cough, phlegm production, or pleurisy. This been no change in bowel or bladder habits. The patient denies unexplained fatigue or unexplained weight loss, bleeding, rash, or fever. A detailed review of systems was otherwise noncontributory.    PAST MEDICAL HISTORY: Past Medical History:  Diagnosis Date  . Anxiety   . Breast cancer, right breast ([place]) 12/2017  . Chronic lower back pain   . DDD (degenerative disc disease), lumbar   . Depression   . Diabetes mellitus, type II ([place])   . Diabetic peripheral neuropathy ([place])   . Dyspnea   . Emphysema of lung ([place]) 03/2018  . Family history of bladder cancer   . Family history of bone cancer   . Family history of brain cancer   . Family history of breast cancer   . Family history of lung cancer   . Family history of ovarian cancer   . Family history of prostate cancer   . Family history of skin cancer   . Fatty liver   . Gallstones   . GERD (gastroesophageal reflux disease)   . Heart murmur    "as a child" (05/01/2018)  . High cholesterol   . Hypertension   . Migraines    "had a major one in ~ 2002; lasted for a long time; had another one 04/2018; didn't last as long as the 1st one" (05/01/2018)  . Neuromuscular disorder ([place])   . Panic attacks    "started in 1995"  . Pneumonia    "once; when I was a child" (05/01/2018)  . PTSD (post-traumatic stress disorder)    "dx'd ~ 2017" (05/01/2018)  . PVC (premature ventricular contraction)   HLD, diagnosed with chronic  major depressive disorder, PTSD, and anxiety. Gallstones   PAST SURGICAL HISTORY: Past Surgical History:  Procedure Laterality Date  . BREAST BIOPSY Right 2019  . BREAST LUMPECTOMY WITH RADIOACTIVE SEED LOCALIZATION Right 01/11/2018   Procedure: BREAST LUMPECTOMY WITH RADIOACTIVE SEED LOCALIZATION;  Surgeon: Coralie Keens, MD;  Location: [facility];  Service: General;  Laterality: Right;  . [place]; 1995; 1998  . MASTECTOMY Right 05/01/2018  . TOTAL MASTECTOMY Right 05/01/2018   Procedure: RIGHT MASTECTOMY;  Surgeon: Coralie Keens, MD;  Location: [facility];  Service: General;  Laterality: Right;  . TUBAL LIGATION  1998  . [place]   "all 4 taken out"    FAMILY HISTORY Family History  Problem Relation Age of Onset  . Drug abuse Mother   . Heart disease Mother   . Hypertension Mother   . Breast cancer Mother 20  . Lung cancer Father   . Aortic aneurysm Father   . Diabetes Sister   . Aortic aneurysm Sister   . Breast cancer Maternal Aunt 70  . Prostate cancer Brother        dx early 51's  . Bladder Cancer Maternal Uncle   . Skin cancer Maternal Grandfather   . Bone cancer Maternal Grandfather   . Ovarian cancer Maternal Aunt        dx 50's/60's  . Brain cancer Maternal  Aunt 55  . Skin cancer Cousin    The patient's father died in the 29's due to metastatic lung cancer (heavy smoker). The patient's mother is alive at 15 and had a history of breast cancer diagnosed in her 60's. The patient has 2 brothers and 1 sister. The patient's brother had prostate cancer. There was a maternal aunt with breast cancer. There was a different maternal aunt with ovarian cancer. The patient notes that all of her maternal aunts and uncles died of various other types of cancers.     GYNECOLOGIC HISTORY:  No LMP recorded. Patient is postmenopausal. Menarche: [age] years old Age at first live birth: 53 years old She is GXP3. Her LMP was about  12 years ago.    SOCIAL HISTORY: (As of September 2019) Leala stays at home and helps take care of her disabled veteran cousin, [name]. Along with [name], the patient lives with her significant other, Hendricks Milo who works at [employer] in [place]. Wes has two children of his own: [name] age 65 who lives in [place], and Lazy Y U age 21 who lives in [place].  The patient notes that her sister will likely come live with her due to her sister having undergone aortic aneurysm repair and acquired MRSA. The patient has three sons who were raised by their father and live in [place]: Bryona Foxworthy age 34, [name] age 68, and [name] age 67    ADVANCED DIRECTIVES: Not in place.  At the 02/21/2018 visit the patient was given the appropriate documents to complete and notarized at her discretion.  She tells me she intends to name [name] as her healthcare power of attorney   HEALTH MAINTENANCE: Social History   Tobacco Use  . Smoking status: Current Every Day Smoker    Packs/day: 0.50    Years: 36.00    Pack years: 18.00    Types: Cigarettes  . Smokeless tobacco: Never Used  Substance Use Topics  . Alcohol use: Yes    Comment: 05/01/2018 "couple beers, 3 times/month"  . Drug use: Not Currently    Comment: 05/01/2018 "experimented in my younger days"     Colonoscopy: Never  PAP: Dr. Margarita Rana in 2018/ normal  Bone density: Never   Allergies  Allergen Reactions  . Morphine And Related Other (See Comments)    UNSPECIFIED REACTION  "Does not settle well with patient"  . Codeine Anxiety  . Sulfa Antibiotics Itching    Current Outpatient Medications  Medication Sig Dispense Refill  . ACCU-CHEK SOFTCLIX LANCETS lancets USE AS DIRECTED ONCE DAILY (Patient not taking: Reported on 03/23/2018) 100 each 12  . acetaminophen (TYLENOL) 500 MG tablet Take 1,500 mg by mouth every 6 (six) hours as needed for moderate pain or headache.    . ALPRAZolam (XANAX) 1 MG tablet Take 1  tablet (1 mg total) by mouth 2 (two) times daily as needed for anxiety. 20 tablet 0  . atorvastatin (LIPITOR) 40 MG tablet Take 1 tablet (40 mg total) by mouth daily. 40 tablet 5  . Blood Glucose Monitoring Suppl (ACCU-CHEK AVIVA PLUS) w/Device KIT USE AS DIRECTED ONCE DAILY (Patient not taking: Reported on 03/23/2018) 1 kit 0  . canagliflozin (INVOKANA) 100 MG TABS tablet Take 100 mg by mouth daily before breakfast.    . empagliflozin (JARDIANCE) 25 MG TABS tablet Take 25 mg by mouth daily. (Patient not taking: Reported on 03/23/2018) 30 tablet 6  . escitalopram (LEXAPRO) 10 MG tablet Take 1 tablet (10 mg total)  by mouth daily. (Patient not taking: Reported on 03/23/2018) 30 tablet 2  . fluconazole (DIFLUCAN) 100 MG tablet Take 100 mg by mouth daily.    Marland Kitchen glimepiride (AMARYL) 4 MG tablet Take 2 tablets (8 mg total) by mouth daily with breakfast. 60 tablet 5  . glucose blood (ACCU-CHEK AVIVA PLUS) test strip Use as instructed (Patient not taking: Reported on 03/23/2018) 100 each 12  . HYDROcodone-acetaminophen (NORCO/VICODIN) 5-325 MG tablet Take 1 tablet by mouth every 6 (six) hours as needed for moderate pain. 30 tablet 0  . hydrOXYzine (ATARAX/VISTARIL) 25 MG tablet TAKE 1 TABLET BY MOUTH 2 TIMES DAILY. (Patient taking differently: Take 50 mg by mouth at bedtime. ) 60 tablet 1  . ibuprofen (ADVIL,MOTRIN) 200 MG tablet Take 800 mg by mouth every 6 (six) hours as needed for headache or moderate pain.     Marland Kitchen ketorolac (TORADOL) 10 MG tablet Take one tab by mouth 2 or 3 times daily as needed for headache. (Patient not taking: Reported on 03/23/2018) 10 tablet 0  . lamoTRIgine (LAMICTAL) 200 MG tablet Take 1 tablet (200 mg total) by mouth daily. 30 tablet 0  . LORazepam (ATIVAN) 0.5 MG tablet Take one tab PO BID to TID prn for anxiety (Patient not taking: Reported on 03/23/2018) 15 tablet 0  . losartan-hydrochlorothiazide (HYZAAR) 100-25 MG tablet Take 1 tablet by mouth daily. 30 tablet 5  . metFORMIN  (GLUCOPHAGE-XR) 500 MG 24 hr tablet Take 2 tablets (1,000 mg total) by mouth 2 (two) times daily. 120 tablet 5  . omeprazole (PRILOSEC) 20 MG capsule TAKE 1 CAPSULE BY MOUTH DAILY. (Patient taking differently: Take 20 mg by mouth daily. ) 30 capsule 3  . pregabalin (LYRICA) 100 MG capsule Take 1 capsule (100 mg total) by mouth 3 (three) times daily. For PASS dose change 270 capsule 1  . tamoxifen (NOLVADEX) 20 MG tablet Take 1 tablet (20 mg total) by mouth daily. 90 tablet 12  . traZODone (DESYREL) 50 MG tablet Take 1 tablet (50 mg total) by mouth at bedtime. (Patient not taking: Reported on 03/23/2018) 30 tablet 3  . venlafaxine XR (EFFEXOR-XR) 75 MG 24 hr capsule Take 1 capsule (75 mg total) by mouth daily with breakfast. 90 capsule 4  . VENTOLIN HFA 108 (90 Base) MCG/ACT inhaler INHALE 2 PUFFS INTO THE LUNGS EVERY 6 (SIX) HOURS AS NEEDED FOR WHEEZING OR SHORTNESS OF BREATH. (Patient taking differently: Inhale 2 puffs into the lungs every 6 (six) hours as needed for wheezing or shortness of breath. ) 18 g 0   No current facility-administered medications for this visit.     OBJECTIVE: Middle-aged white woman who appears stated age  74:   06/19/18 1118  BP: (!) 150/86  Pulse: 70  Resp: 18  Temp: 99 F (37.2 C)  SpO2: 98%     Body mass index is 36.63 kg/m.   Wt Readings from Last 3 Encounters:  06/19/18 213 lb 6.4 oz (96.8 kg)  05/01/18 216 lb 0.8 oz (98 kg)  04/18/18 216 lb 0.8 oz (98 kg)      ECOG FS:1 - Symptomatic but completely ambulatory  Sclerae unicteric, EOMs intact Oropharynx clear and moist No cervical or supraclavicular adenopathy Lungs no rales or rhonchi Heart regular rate and rhythm Abd soft, nontender, positive bowel sounds MSK no focal spinal tenderness, no upper extremity lymphedema Neuro: nonfocal, well oriented, appropriate affect Breasts: Right breast is status post mastectomy.  The incision is healing nicely, without dehiscence or swelling.  There is  minimal erythema in 1 of the folds of the incision.  The left breast is benign.  Both axillae are benign   LAB RESULTS:  CMP     Component Value Date/Time   NA 141 06/19/2018 1017   NA 140 11/02/2017 1558   K 4.1 06/19/2018 1017   CL 103 06/19/2018 1017   CO2 28 06/19/2018 1017   GLUCOSE 174 (H) 06/19/2018 1017   BUN 10 06/19/2018 1017   BUN 11 11/02/2017 1558   CREATININE 0.78 06/19/2018 1017   CREATININE 0.64 10/21/2015 1538   CALCIUM 9.2 06/19/2018 1017   PROT 7.1 06/19/2018 1017   PROT 6.8 04/07/2017 0834   ALBUMIN 3.3 (L) 06/19/2018 1017   ALBUMIN 4.0 04/07/2017 0834   AST 18 06/19/2018 1017   ALT 15 06/19/2018 1017   ALKPHOS 81 06/19/2018 1017   BILITOT 0.4 06/19/2018 1017   GFRNONAA >60 06/19/2018 1017   GFRNONAA >89 10/21/2015 1538   GFRAA >60 06/19/2018 1017   GFRAA >89 10/21/2015 1538    No results found for: TOTALPROTELP, ALBUMINELP, A1GS, A2GS, BETS, BETA2SER, GAMS, MSPIKE, SPEI  No results found for: KPAFRELGTCHN, LAMBDASER, KAPLAMBRATIO  Lab Results  Component Value Date   WBC 10.4 06/19/2018   NEUTROABS 5.3 06/19/2018   HGB 14.1 06/19/2018   HCT 44.8 06/19/2018   MCV 87.3 06/19/2018   PLT 282 06/19/2018    @LASTCHEMISTRY @  No results found for: LABCA2  No components found for: [id number]  No results for input(s): INR in the last 168 hours.  No results found for: LABCA2  No results found for: DSK876  No results found for: OTL572  No results found for: IOM355  No results found for: CA2729  No components found for: HGQUANT  No results found for: CEA1 / No results found for: CEA1   No results found for: AFPTUMOR  No results found for: CHROMOGRNA  No results found for: PSA1  Appointment on 06/19/2018  Component Date Value Ref Range Status  . Sodium 06/19/2018 141  135 - 145 mmol/L Final  . Potassium 06/19/2018 4.1  3.5 - 5.1 mmol/L Final  . Chloride 06/19/2018 103  98 - 111 mmol/L Final  . CO2 06/19/2018 28  22 - 32 mmol/L Final   . Glucose, Bld 06/19/2018 174* 70 - 99 mg/dL Final  . BUN 06/19/2018 10  6 - 20 mg/dL Final  . Creatinine 06/19/2018 0.78  0.44 - 1.00 mg/dL Final  . Calcium 06/19/2018 9.2  8.9 - 10.3 mg/dL Final  . Total Protein 06/19/2018 7.1  6.5 - 8.1 g/dL Final  . Albumin 06/19/2018 3.3* 3.5 - 5.0 g/dL Final  . AST 06/19/2018 18  15 - 41 U/L Final  . ALT 06/19/2018 15  0 - 44 U/L Final  . Alkaline Phosphatase 06/19/2018 81  38 - 126 U/L Final  . Total Bilirubin 06/19/2018 0.4  0.3 - 1.2 mg/dL Final  . GFR, Est Non Af Am 06/19/2018 >60  >60 mL/min Final  . GFR, Est AFR Am 06/19/2018 >60  >60 mL/min Final  . Anion gap 06/19/2018 10  5 - 15 Final   Performed at [facility], [place] [address], [place],  [address]  . WBC Count 06/19/2018 10.4  4.0 - 10.5 K/uL Final  . RBC 06/19/2018 5.13* 3.87 - 5.11 MIL/uL Final  . Hemoglobin 06/19/2018 14.1  12.0 - 15.0 g/dL Final  . HCT 06/19/2018 44.8  36.0 - 46.0 % Final  . MCV 06/19/2018  87.3  80.0 - 100.0 fL Final  . MCH 06/19/2018 27.5  26.0 - 34.0 pg Final  . MCHC 06/19/2018 31.5  30.0 - 36.0 g/dL Final  . RDW 06/19/2018 15.8* 11.5 - 15.5 % Final  . Platelet Count 06/19/2018 282  150 - 400 K/uL Final  . nRBC 06/19/2018 0.0  0.0 - 0.2 % Final  . Neutrophils Relative % 06/19/2018 51  % Final  . Neutro Abs 06/19/2018 5.3  1.7 - 7.7 K/uL Final  . Lymphocytes Relative 06/19/2018 43  % Final  . Lymphs Abs 06/19/2018 4.4* 0.7 - 4.0 K/uL Final  . Monocytes Relative 06/19/2018 4  % Final  . Monocytes Absolute 06/19/2018 0.4  0.1 - 1.0 K/uL Final  . Eosinophils Relative 06/19/2018 1  % Final  . Eosinophils Absolute 06/19/2018 0.2  0.0 - 0.5 K/uL Final  . Basophils Relative 06/19/2018 0  % Final  . Basophils Absolute 06/19/2018 0.0  0.0 - 0.1 K/uL Final  . Immature Granulocytes 06/19/2018 1  % Final  . Abs Immature Granulocytes 06/19/2018 0.05  0.00 - 0.07 K/uL Final   Performed at [facility], [place] [address] [place], [place] [address]    (this displays the last labs from the last 3 days)  No results found for: TOTALPROTELP, ALBUMINELP, A1GS, A2GS, BETS, BETA2SER, GAMS, MSPIKE, SPEI (this displays SPEP labs)  No results found for: KPAFRELGTCHN, LAMBDASER, KAPLAMBRATIO (kappa/lambda light chains)  No results found for: HGBA, HGBA2QUANT, HGBFQUANT, HGBSQUAN (Hemoglobinopathy evaluation)   No results found for: LDH  No results found for: IRON, TIBC, IRONPCTSAT (Iron and TIBC)  No results found for: FERRITIN  Urinalysis    Component Value Date/Time   COLORURINE YELLOW 02/09/2014 0046   APPEARANCEUR CLEAR 02/09/2014 0046   LABSPEC 1.016 02/09/2014 0046   PHURINE 5.5 02/09/2014 0046   GLUCOSEU NEGATIVE 02/09/2014 0046   HGBUR NEGATIVE 02/09/2014 0046   BILIRUBINUR NEGATIVE 02/09/2014 0046   KETONESUR NEGATIVE 02/09/2014 0046   PROTEINUR NEGATIVE 02/09/2014 0046   UROBILINOGEN 0.2 02/09/2014 0046   NITRITE POSITIVE (A) 02/09/2014 0046   LEUKOCYTESUR SMALL (A) 02/09/2014 0046     STUDIES: No results found.   ELIGIBLE FOR AVAILABLE RESEARCH PROTOCOL: no  ASSESSMENT: 53 y.o. [place], [place] woman status post right lumpectomy 01/11/2018 for ductal carcinoma in situ, grade 2, measuring 0.6 cm, strongly estrogen and progesterone receptor positive, with a positive medial margin  (1) genetics testing 03/06/2018 through the multi-Cancer Panel offered by [facility] found no deleterious mutations in AIP, ALK, APC, ATM, AXIN2, BAP1, BARD1, BLM, BMPR1A, BRCA1, BRCA2, BRIP1, BUB1B, CASR, CDC73, CDH1, CDK4, CDKN1B, CDKN1C, CDKN2A, CEBPA, CEP57, CHEK2, CTNNA1, DICER1, DIS3L2, EGFR, ENG, EPCAM, FH, FLCN, GALNT12, GATA2, GPC3, GREM1, HOXB13, HRAS, KIT, MAX, MEN1, MET, MITF, MLH1, MLH3, MSH2, MSH3, MSH6, MUTYH, NBN, NF1, NF2, NTHL1, PALB2, PDGFRA, PHOX2B, PMS2, POLD1, POLE, POT1, PRKAR1A, PTCH1, PTEN, RAD50, RAD51C, RAD51D, RB1, RECQL4, RET, RNF43, RPS20, RUNX1, SDHA, SDHAF2, SDHB, SDHC, SDHD,  SMAD4, SMARCA4, SMARCB1, SMARCE1, STK11, SUFU, TERC, TERT, TMEM127, TP53, TSC1, TSC2, VHL, WRN, WT1  (a) 2 variants of uncertain significance in the genes ATM c.8156G>A and RNF43 c.833A>T were identified.   (2) right simple mastectomy 05/01/2018 found a microscopic focus of residual ductal carcinoma in situ, low-grade, with negative margins  (3) patient refused radiation--opted for mastectomy instead  (4) started tamoxifen 05/15/2019   PLAN: Courtney Dixon did very well with her surgery and she is essentially cured from her noninvasive breast cancer: The cure rate for in  situ breast cancer with mastectomy approaches 100%.  We reviewed her pathology report in detail so she could understand well.  Also went over her genetics results which are also favorable.  She understands that variants of uncertain significance are just that, but most of them eventually are reclassified as benign  I am surprised she did not have more trouble going off the venlafaxine.  Patients usually complain bitterly of severe symptoms for several days.  Nevertheless she tells me her psychiatrist is in favor of her being on that medication and from our point of view it helps her with the hot flashes.  Accordingly I have refilled the venlafaxine for her.  She is tolerating tamoxifen well and the plan will be to continue that a total of 5 years.  The main purpose of that of course is prophylaxis.  I have sent her to lab to give [place] a urinalysis, but she might benefit from referral to a urologist for evaluation of interstitial cystitis  She will see me again in August after her next mammogram  She knows to call for any other issues that may develop before the next visit.  I spent approximately 30 minutes face to face with Courtney Dixon with more than 50% of that time spent in counseling and coordination of care.    Magrinat, Virgie Dad, MD  06/19/18 12:03 PM Medical Oncology and Hematology [facility] [place], [place] [address] Tel. [phone]    Fax. [phone]   I, Jacqualyn Posey am acting as a Education administrator for Chauncey Cruel, MD.   I, Lurline Del MD, have reviewed the above documentation for accuracy and completeness, and I agree with the above.

## 2018-06-19 ENCOUNTER — Inpatient Hospital Stay (HOSPITAL_BASED_OUTPATIENT_CLINIC_OR_DEPARTMENT_OTHER): Payer: Medicaid Other | Admitting: Oncology

## 2018-06-19 ENCOUNTER — Telehealth: Payer: Self-pay | Admitting: Oncology

## 2018-06-19 ENCOUNTER — Encounter: Payer: Self-pay | Admitting: Oncology

## 2018-06-19 ENCOUNTER — Inpatient Hospital Stay: Payer: Medicaid Other | Attending: Oncology

## 2018-06-19 ENCOUNTER — Inpatient Hospital Stay: Payer: Medicaid Other

## 2018-06-19 ENCOUNTER — Other Ambulatory Visit: Payer: Self-pay | Admitting: *Deleted

## 2018-06-19 VITALS — BP 150/86 | HR 70 | Temp 99.0°F | Resp 18 | Ht 64.0 in | Wt 213.4 lb

## 2018-06-19 DIAGNOSIS — Z720 Tobacco use: Secondary | ICD-10-CM

## 2018-06-19 DIAGNOSIS — C50311 Malignant neoplasm of lower-inner quadrant of right female breast: Secondary | ICD-10-CM

## 2018-06-19 DIAGNOSIS — F3178 Bipolar disorder, in full remission, most recent episode mixed: Secondary | ICD-10-CM

## 2018-06-19 DIAGNOSIS — E1142 Type 2 diabetes mellitus with diabetic polyneuropathy: Secondary | ICD-10-CM

## 2018-06-19 DIAGNOSIS — E1165 Type 2 diabetes mellitus with hyperglycemia: Secondary | ICD-10-CM

## 2018-06-19 DIAGNOSIS — R309 Painful micturition, unspecified: Secondary | ICD-10-CM | POA: Diagnosis not present

## 2018-06-19 DIAGNOSIS — R634 Abnormal weight loss: Secondary | ICD-10-CM

## 2018-06-19 DIAGNOSIS — Z170 Estrogen receptor positive status [ER+]: Secondary | ICD-10-CM | POA: Diagnosis not present

## 2018-06-19 DIAGNOSIS — D0511 Intraductal carcinoma in situ of right breast: Secondary | ICD-10-CM

## 2018-06-19 DIAGNOSIS — IMO0002 Reserved for concepts with insufficient information to code with codable children: Secondary | ICD-10-CM

## 2018-06-19 DIAGNOSIS — Z9011 Acquired absence of right breast and nipple: Secondary | ICD-10-CM | POA: Insufficient documentation

## 2018-06-19 LAB — CMP (CANCER CENTER ONLY)
ALT: 15 U/L (ref 0–44)
AST: 18 U/L (ref 15–41)
Albumin: 3.3 g/dL — ABNORMAL LOW (ref 3.5–5.0)
Alkaline Phosphatase: 81 U/L (ref 38–126)
Anion gap: 10 (ref 5–15)
BUN: 10 mg/dL (ref 6–20)
CHLORIDE: 103 mmol/L (ref 98–111)
CO2: 28 mmol/L (ref 22–32)
Calcium: 9.2 mg/dL (ref 8.9–10.3)
Creatinine: 0.78 mg/dL (ref 0.44–1.00)
GFR, Est AFR Am: >60 mL/min (ref >60)
Glucose, Bld: 174 mg/dL — ABNORMAL HIGH (ref 70–99)
POTASSIUM: 4.1 mmol/L (ref 3.5–5.1)
Sodium: 141 mmol/L (ref 135–145)
Total Bilirubin: 0.4 mg/dL (ref 0.3–1.2)
Total Protein: 7.1 g/dL (ref 6.5–8.1)

## 2018-06-19 LAB — CBC WITH DIFFERENTIAL (CANCER CENTER ONLY)
Abs Immature Granulocytes: 0.05 10*3/uL (ref 0.00–0.07)
Basophils Absolute: 0 10*3/uL (ref 0.0–0.1)
Basophils Relative: 0 %
Eosinophils Absolute: 0.2 10*3/uL (ref 0.0–0.5)
Eosinophils Relative: 1 %
HCT: 44.8 % (ref 36.0–46.0)
HEMOGLOBIN: 14.1 g/dL (ref 12.0–15.0)
Immature Granulocytes: 1 %
LYMPHS PCT: 43 %
Lymphs Abs: 4.4 10*3/uL — ABNORMAL HIGH (ref 0.7–4.0)
MCH: 27.5 pg (ref 26.0–34.0)
MCHC: 31.5 g/dL (ref 30.0–36.0)
MCV: 87.3 fL (ref 80.0–100.0)
Monocytes Absolute: 0.4 10*3/uL (ref 0.1–1.0)
Monocytes Relative: 4 %
NEUTROS ABS: 5.3 10*3/uL (ref 1.7–7.7)
Neutrophils Relative %: 51 %
Platelet Count: 282 10*3/uL (ref 150–400)
RBC: 5.13 MIL/uL — AB (ref 3.87–5.11)
RDW: 15.8 % — ABNORMAL HIGH (ref 11.5–15.5)
WBC: 10.4 10*3/uL (ref 4.0–10.5)
nRBC: 0 % (ref 0.0–0.2)

## 2018-06-19 LAB — URINALYSIS, COMPLETE (UACMP) WITH MICROSCOPIC
Bilirubin Urine: NEGATIVE
GLUCOSE, UA: NEGATIVE mg/dL
Hgb urine dipstick: NEGATIVE
Ketones, ur: NEGATIVE mg/dL
Leukocytes, UA: NEGATIVE
NITRITE: NEGATIVE
PROTEIN: NEGATIVE mg/dL
Specific Gravity, Urine: 1.011 (ref 1.005–1.030)
pH: 7 (ref 5.0–8.0)

## 2018-06-19 MED ORDER — TAMOXIFEN CITRATE 20 MG PO TABS: 20.0000 mg | ORAL_TABLET | Freq: Every day | ORAL | 12 refills | Status: AC

## 2018-06-19 MED ORDER — VENLAFAXINE HCL ER 75 MG PO CP24: 75.0000 mg | ORAL_CAPSULE | Freq: Every day | ORAL | 4 refills | Status: AC

## 2018-06-19 NOTE — Telephone Encounter (Signed)
Gave avs and calendar ° °

## 2018-06-29 ENCOUNTER — Ambulatory Visit: Payer: No Typology Code available for payment source | Admitting: Oncology

## 2018-06-29 ENCOUNTER — Other Ambulatory Visit: Payer: No Typology Code available for payment source

## 2018-11-10 ENCOUNTER — Other Ambulatory Visit: Payer: Self-pay | Admitting: Family Medicine

## 2018-11-10 DIAGNOSIS — Z853 Personal history of malignant neoplasm of breast: Secondary | ICD-10-CM

## 2018-11-24 ENCOUNTER — Other Ambulatory Visit: Payer: Self-pay | Admitting: Adult Health

## 2018-11-24 DIAGNOSIS — Z853 Personal history of malignant neoplasm of breast: Secondary | ICD-10-CM

## 2018-11-27 ENCOUNTER — Ambulatory Visit
Admission: RE | Admit: 2018-11-27 | Discharge: 2018-11-27 | Disposition: A | Discharge status: Home / Self Care | Payer: Medicaid Other | Source: Ambulatory Visit | Attending: Family Medicine | Admitting: Family Medicine

## 2018-11-27 ENCOUNTER — Other Ambulatory Visit: Payer: Self-pay

## 2018-11-27 DIAGNOSIS — Z853 Personal history of malignant neoplasm of breast: Secondary | ICD-10-CM

## 2019-02-08 ENCOUNTER — Inpatient Hospital Stay: Payer: Medicaid Other | Admitting: Oncology

## 2019-03-05 ENCOUNTER — Inpatient Hospital Stay: Payer: Medicaid Other | Attending: Oncology | Admitting: Adult Health

## 2019-03-08 ENCOUNTER — Encounter (HOSPITAL_COMMUNITY): Payer: Self-pay | Admitting: *Deleted

## 2019-11-29 IMAGING — MG DIGITAL SCREENING BILATERAL MAMMOGRAM WITH TOMO AND CAD
6 of 12 series · 6 of 36 positions shown · non-contrast
Comparison: None

CLINICAL DATA: Screening.

EXAM:
DIGITAL SCREENING BILATERAL MAMMOGRAM WITH TOMO AND CAD

[L CC synth-2D (1 of 2)]
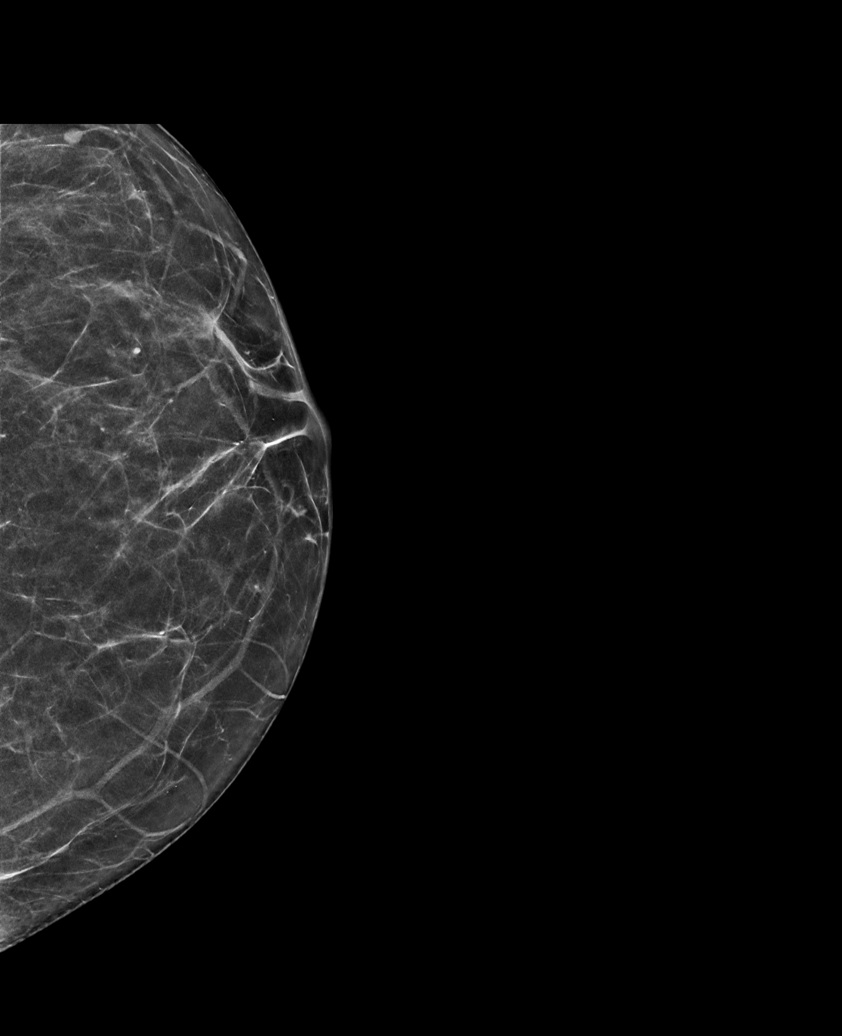

[R MLO synth-2D]
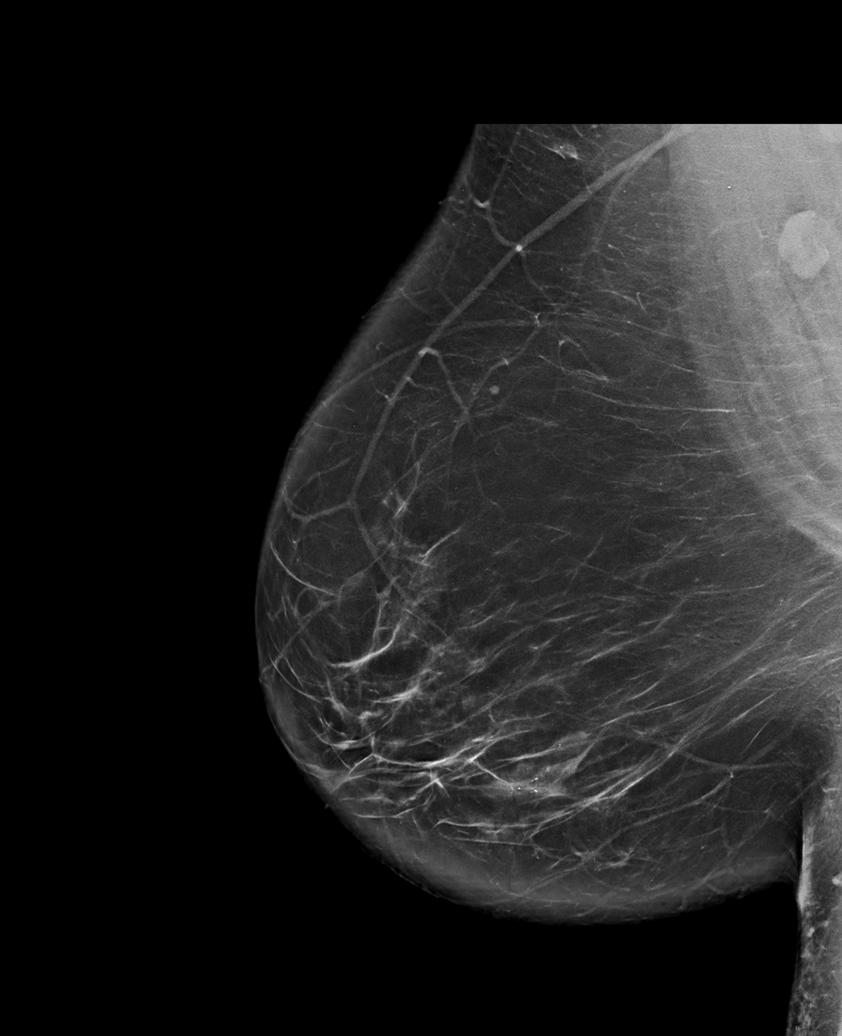

[L MLO synth-2D]
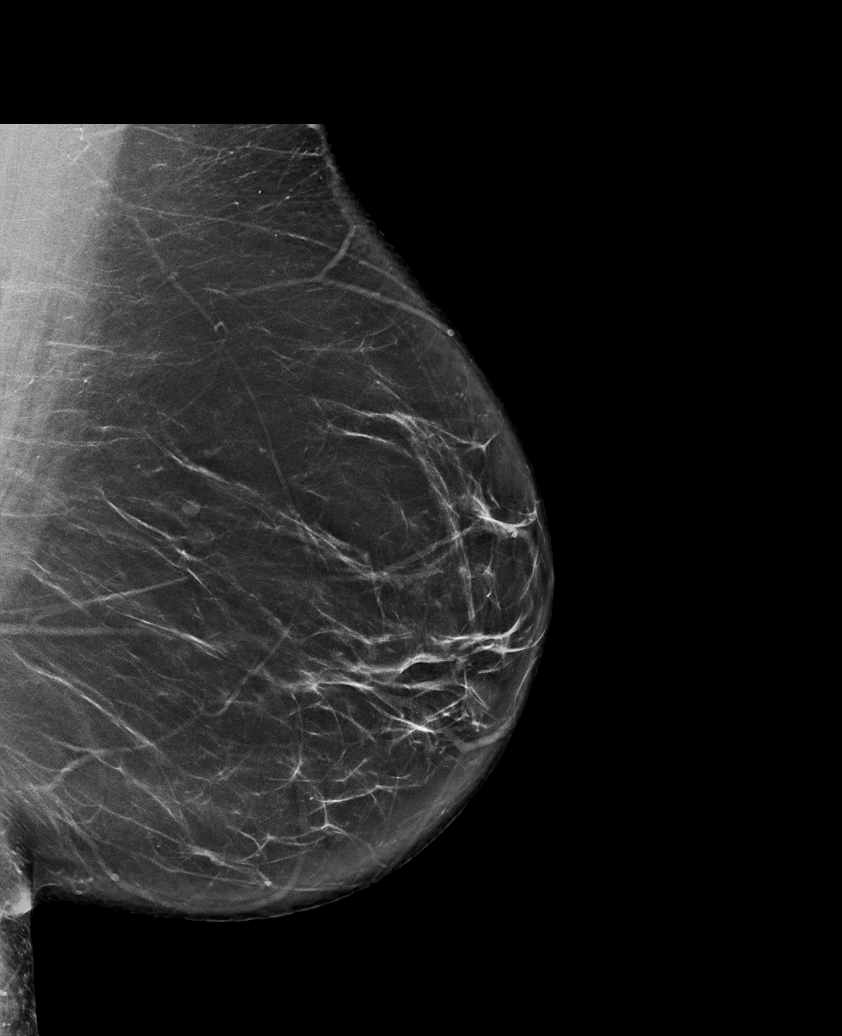

[R CC synth-2D (1 of 2)]
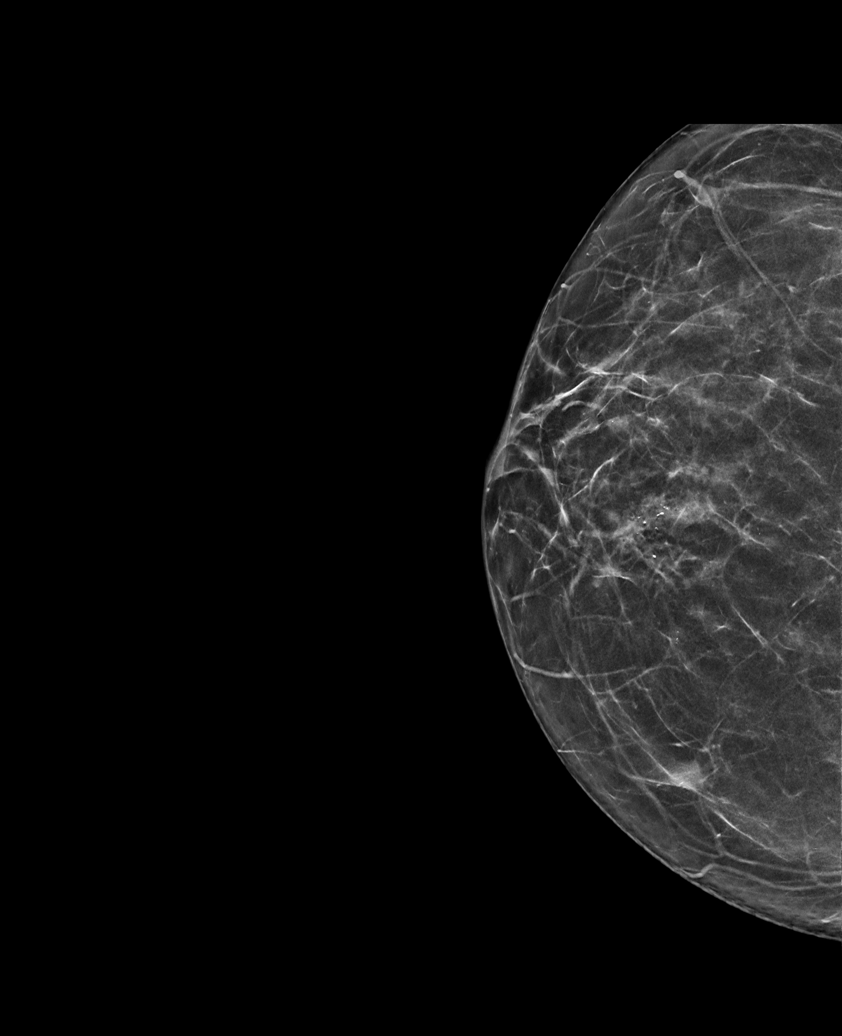

[R CC synth-2D (2 of 2)]
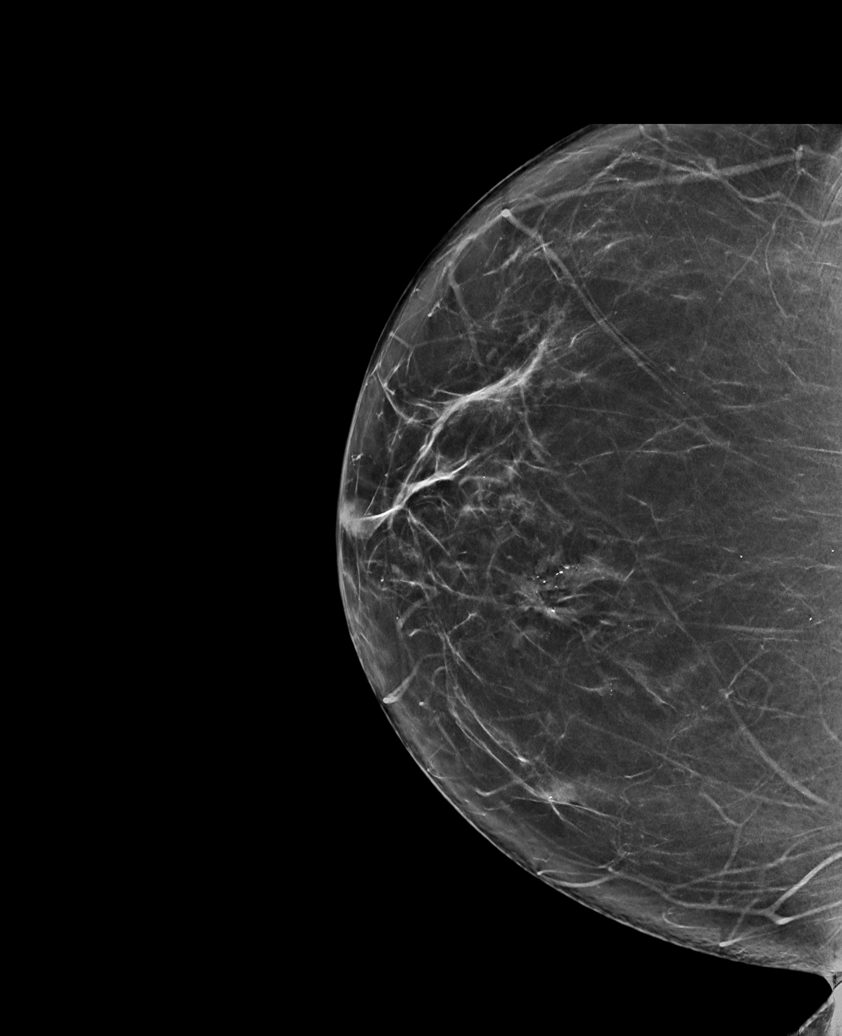

[L CC synth-2D (2 of 2)]
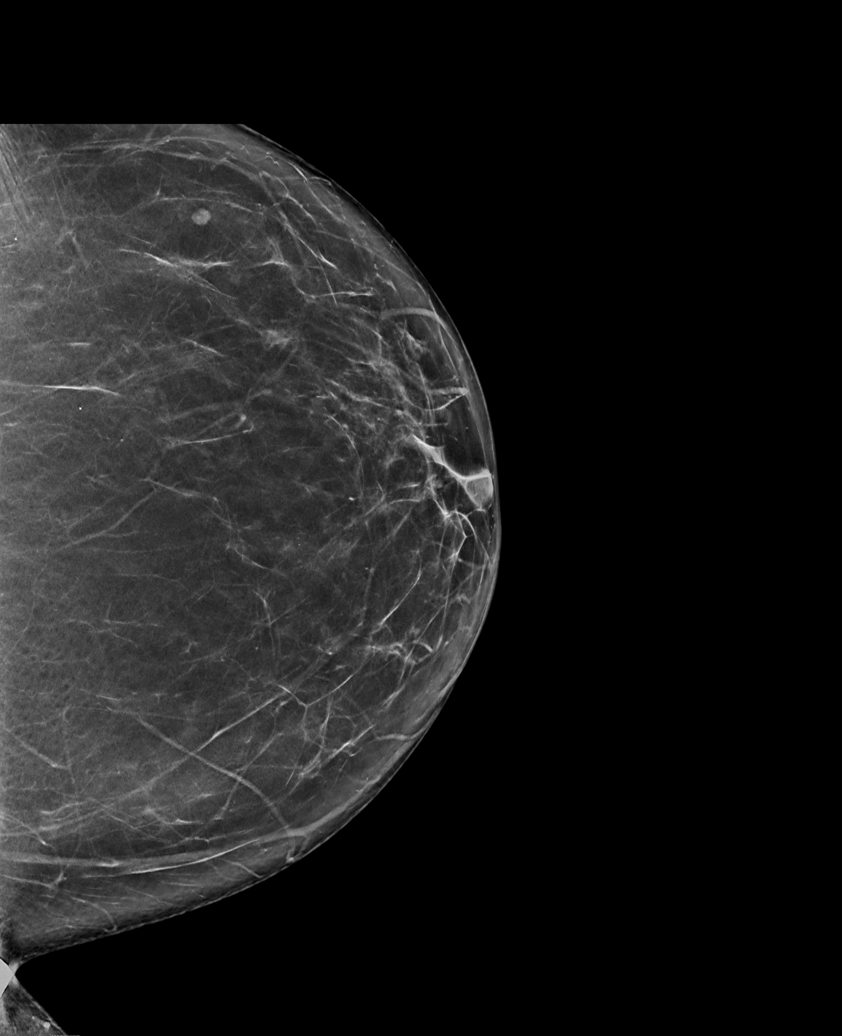

[6 of 36 positions shown; findings below may reference images not displayed]

ACR Breast Density Category b: There are scattered areas of
fibroglandular density.
FINDINGS: In the right breast, an irregular mass with distortion and
calcifications, as well as an adjacent smooth bordered possible
masses warrant further evaluation. In the left breast, no findings
suspicious for malignancy. Images were processed with CAD.
IMPRESSION: Further evaluation is suggested for possible masses in the right
breast.

RECOMMENDATION:
Diagnostic mammogram and possibly ultrasound of the right breast.
(Code:[id number])

The patient will be contacted regarding the findings, and additional
imaging will be scheduled.

BI-RADS CATEGORY  0: Incomplete. Need additional imaging evaluation
and/or prior mammograms for comparison.

## 2019-12-04 IMAGING — US ULTRASOUND RIGHT BREAST LIMITED
1 series · 13 of 17 positions shown · non-contrast
Comparison: Screening study, 11/03/2017.

CLINICAL DATA: Screening recall for irregular mass with distortion
and calcifications as well as another possible mass in the right
breast.

EXAM:
DIGITAL DIAGNOSTIC RIGHT MAMMOGRAM WITH CAD AND TOMO
ULTRASOUND RIGHT BREAST

[Series 1: ultrasound right breast limited · 0.07mm/px · 13 of 17 slices shown]
[im 1/17]
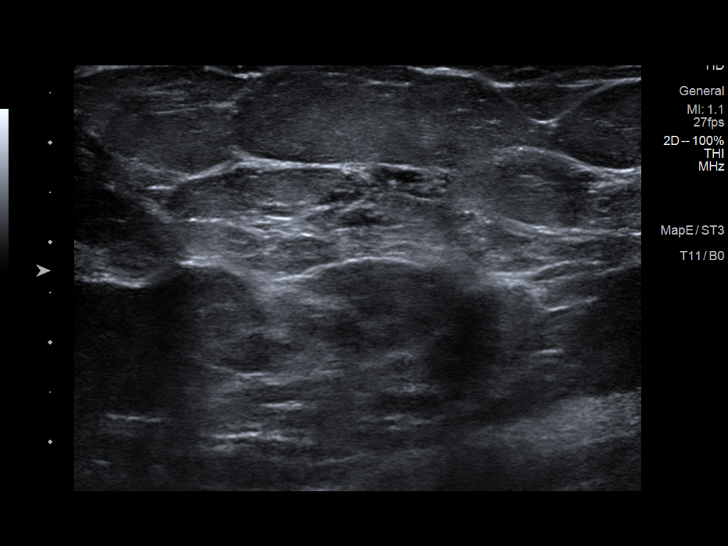
[im 2/17]
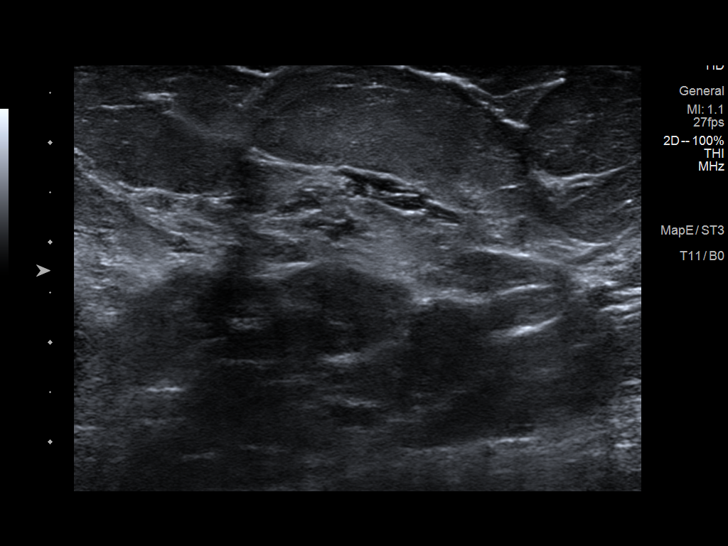
[im 4/17]
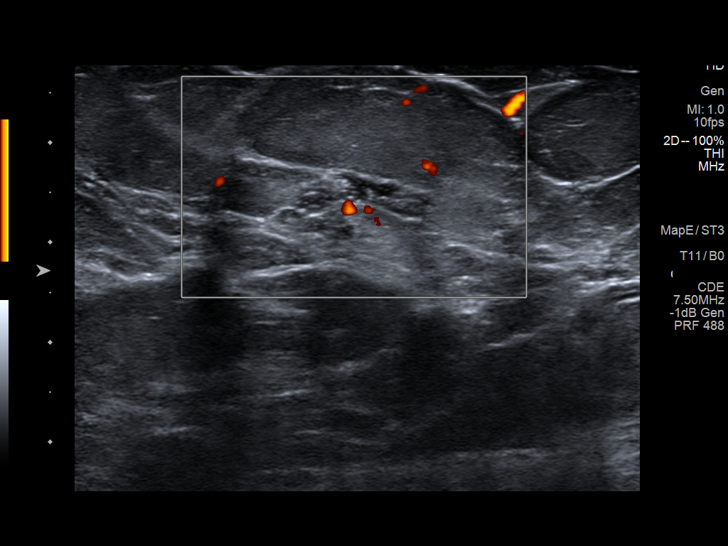
[im 5/17]
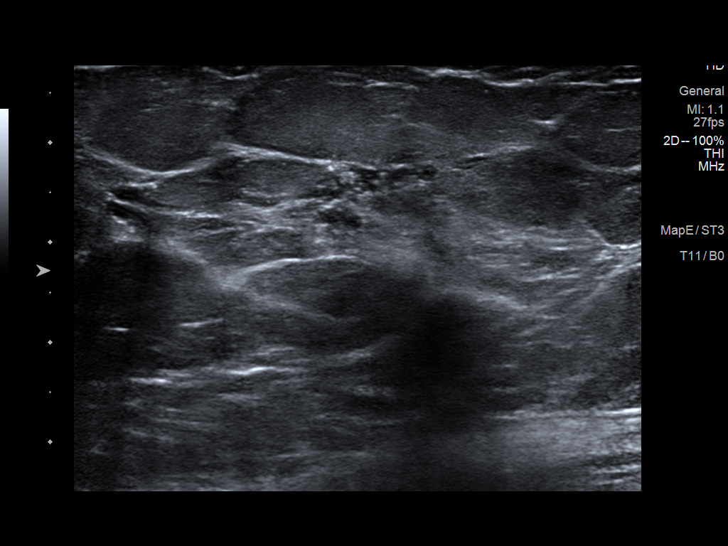
[im 6/17]
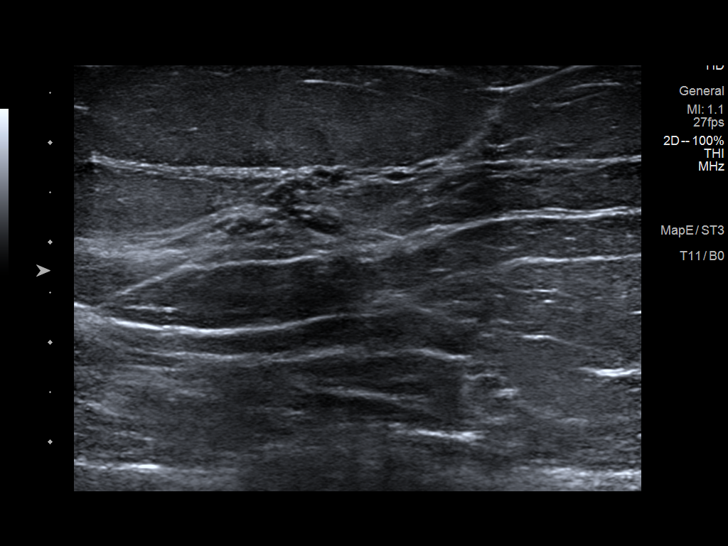
[im 8/17]
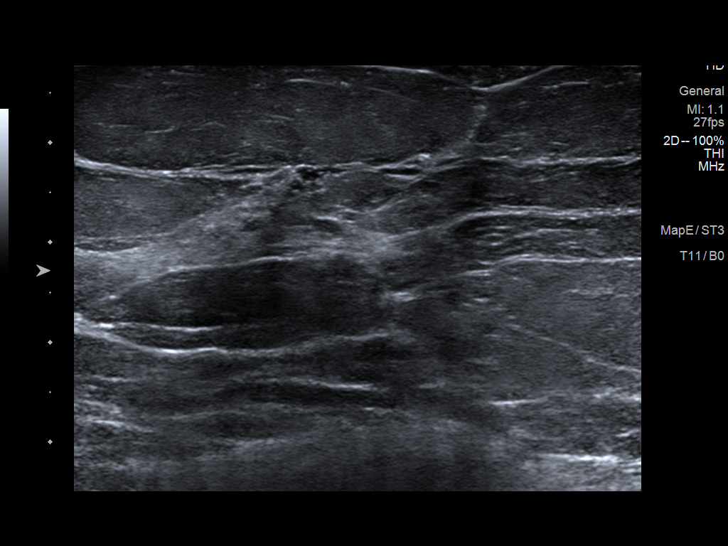
[im 9/17]
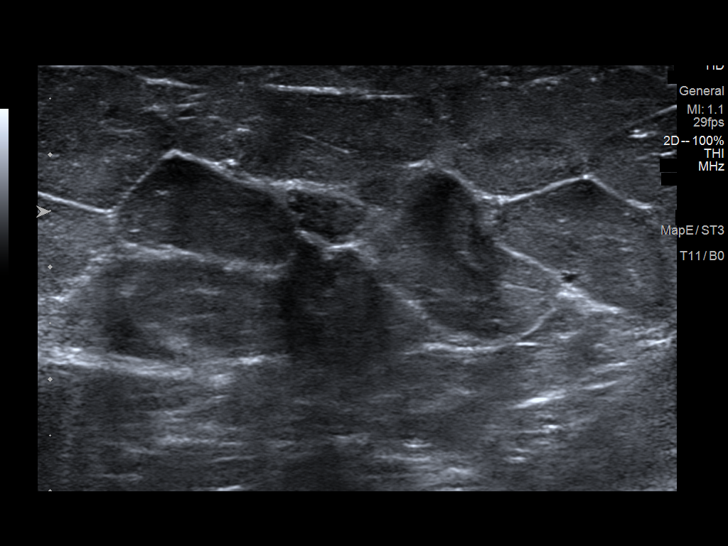
[im 10/17]
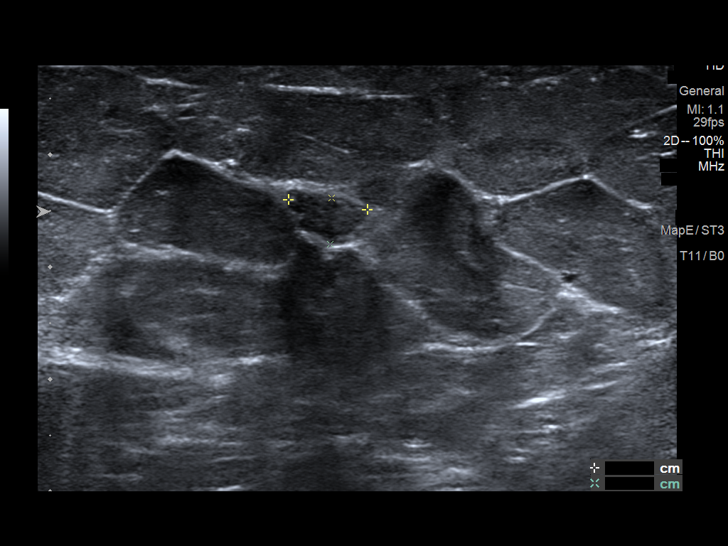
[im 12/17]
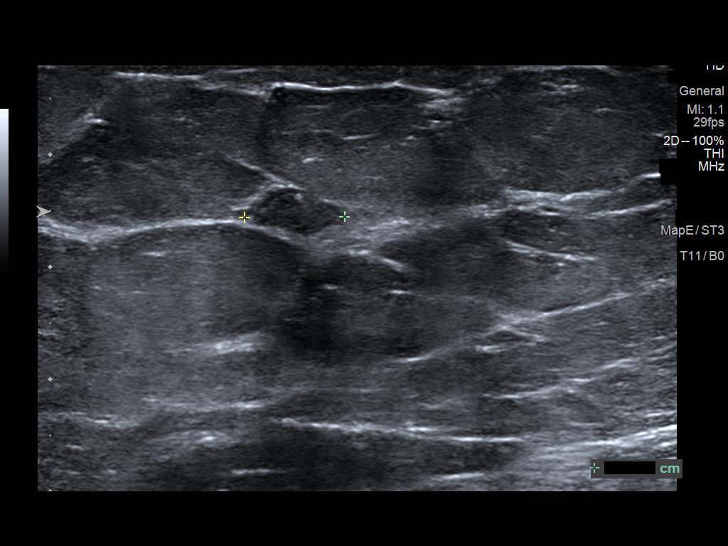
[im 13/17]
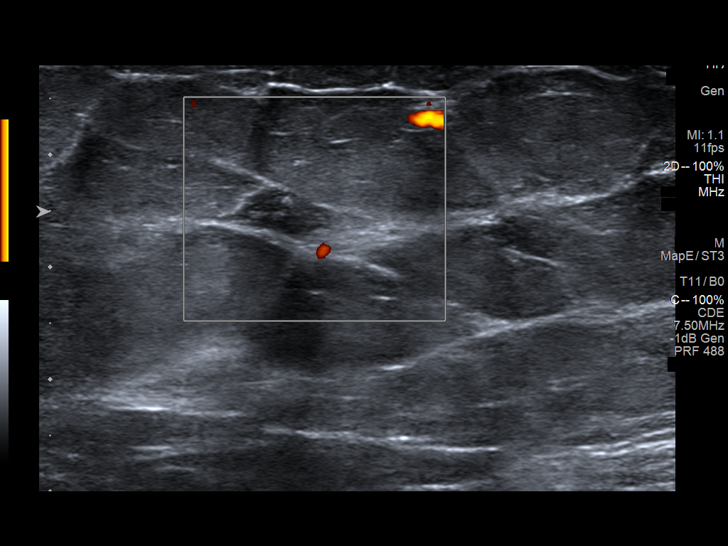
[im 14/17]
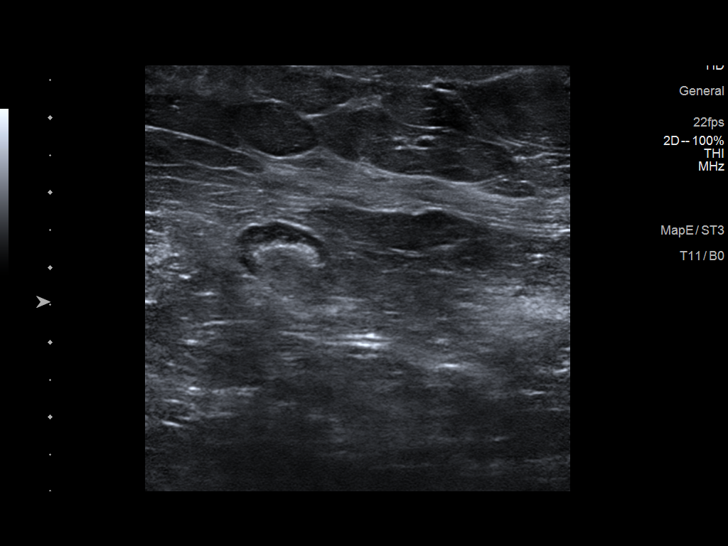
[im 16/17]
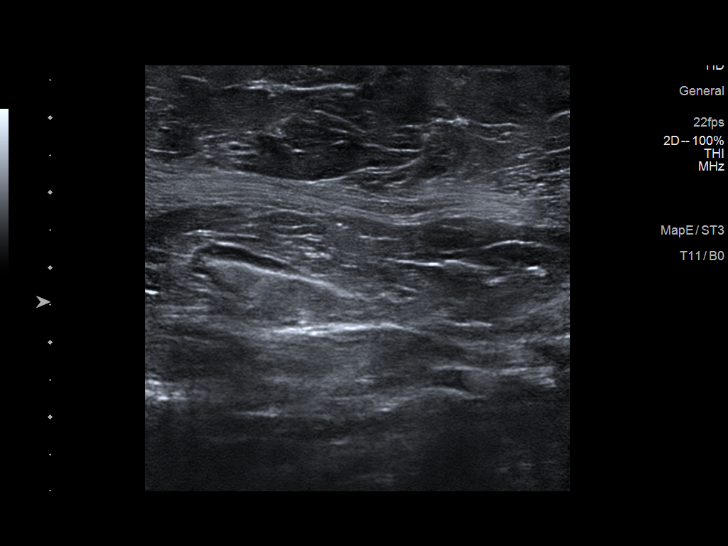
[im 17/17]
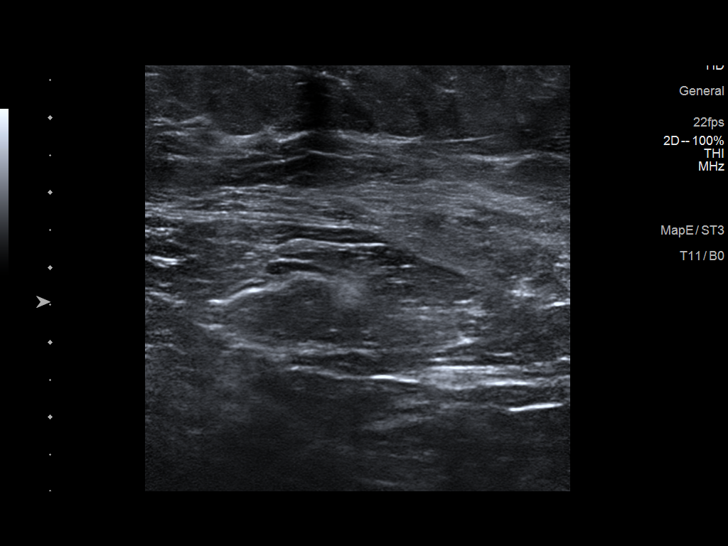

[13 of 17 positions shown; findings below may reference images not displayed]

No older studies.

ACR Breast Density Category b: There are scattered areas of
fibroglandular density.
FINDINGS: The irregular mass with associated distortion and calcifications
persists on spot compression imaging, although the mass is not
defined. Calcifications pleomorphic and somewhat linear. This area
lies along the lower inner aspect the right breast.

More laterally and posteriorly, the smoothly marginated mass, with a
single benign-appearing calcification, also persists on spot
compression imaging.

No other mammographic abnormalities.

Mammographic images were processed with CAD.

On physical exam, no mass is palpated in the medial or lower inner
aspect of the right breast.

Targeted ultrasound is performed, showing a mixed echogenicity area
in the right breast at 4 o'clock, 3 cm the left, with some echogenic
foci, which may correspond to mammographic irregular mass,
distortion and calcifications. This measures 2.0 x 1.7 x 1.9 cm. In
the 2:30 o'clock position the right mass, there is a circumscribed
hypoechoic mass with a single echogenic focus consistent with a
calcification. This corresponds to the benign-appearing mammographic
mass. It measures 9 x 4 x 7 mm.

Sonographic evaluation of the right axilla shows no enlarged or
abnormal lymph nodes.
IMPRESSION: 1. Suspicious lesion in the right breast at 4 o'clock, 3 cm the
nipple. Since this is best visualized mammographically, stereotactic
core needle biopsy targeting the density and distortion and
associated calcifications is recommended.
2. Probably benign small mass in the 2:30 o'clock position of the
right breast.

RECOMMENDATION:
1. Stereotactic core needle biopsy of right breast irregular
density, distortion and calcifications, in the 4 o'clock position.
2. For the 2:30 o'clock benign-appearing mass, biopsy should be
considered if the 4 o'clock lesion is found to be carcinoma. If it
is benign on biopsy, then short-term follow-up for the 2:30 o'clock
lesion is recommended with follow-up ultrasound only 6 months.

I have discussed the findings and recommendations with the patient.
Results were also provided in writing at the conclusion of the
visit. If applicable, a reminder letter will be sent to the patient
regarding the next appointment.

BI-RADS CATEGORY  4: Suspicious.

## 2020-01-16 ENCOUNTER — Telehealth: Payer: Self-pay

## 2020-01-16 NOTE — Telephone Encounter (Signed)
[facility] [employer] recertified x 12 months (11/13/2019-12/11/2020). MID # [id number].

## 2023-06-27 ENCOUNTER — Encounter: Payer: Self-pay | Admitting: Genetic Counselor
